# Patient Record
Sex: Male | Born: 1950
Health system: Southern US, Community
[De-identification: ages and names within clinical notes are randomized; demographics above are authoritative.]

## PROBLEM LIST (undated history)

## (undated) DIAGNOSIS — G709 Myoneural disorder, unspecified: Secondary | ICD-10-CM

## (undated) DIAGNOSIS — C801 Malignant (primary) neoplasm, unspecified: Secondary | ICD-10-CM

## (undated) DIAGNOSIS — Z973 Presence of spectacles and contact lenses: Secondary | ICD-10-CM

## (undated) DIAGNOSIS — F319 Bipolar disorder, unspecified: Secondary | ICD-10-CM

## (undated) DIAGNOSIS — J189 Pneumonia, unspecified organism: Secondary | ICD-10-CM

## (undated) DIAGNOSIS — G4730 Sleep apnea, unspecified: Secondary | ICD-10-CM

## (undated) DIAGNOSIS — M1990 Unspecified osteoarthritis, unspecified site: Secondary | ICD-10-CM

## (undated) HISTORY — PX: TONSILLECTOMY: SUR1361

## (undated) HISTORY — PX: APPENDECTOMY: SHX54

## (undated) HISTORY — PX: COLONOSCOPY: SHX174

---

## 2001-04-12 ENCOUNTER — Ambulatory Visit (HOSPITAL_COMMUNITY): Admission: RE | Admit: 2001-04-12 | Discharge: 2001-04-12 | Payer: Self-pay | Admitting: Family Medicine

## 2001-04-12 ENCOUNTER — Encounter: Payer: Self-pay | Admitting: Family Medicine

## 2002-05-30 HISTORY — PX: KNEE ARTHROSCOPY W/ MENISCAL REPAIR: SHX1877

## 2004-07-30 ENCOUNTER — Ambulatory Visit (HOSPITAL_COMMUNITY): Admission: RE | Admit: 2004-07-30 | Discharge: 2004-07-30 | Payer: Self-pay | Admitting: Gastroenterology

## 2013-09-16 ENCOUNTER — Encounter (HOSPITAL_BASED_OUTPATIENT_CLINIC_OR_DEPARTMENT_OTHER): Payer: Self-pay | Admitting: *Deleted

## 2013-09-16 NOTE — Progress Notes (Signed)
Pt separated-will have friend bring him-no labs needed-bring all meds and overnight bag

## 2013-09-17 ENCOUNTER — Other Ambulatory Visit: Payer: Self-pay | Admitting: Physician Assistant

## 2013-09-18 NOTE — H&P (Signed)
  Timothy [name]/[facility] [address] [place], [place] [address] [phone] A Division of [facility]  [name], M.D.   Robert A. Noemi Chapel, M.D.   Faythe Casa, M.D.   Johnny Bridge, M.D.   Almedia Balls, M.D Ernesta Amble. Percell Miller, M.D.  Joseph Pierini, M.D.  Lanier Prude, M.D.    Verner Chol, M.D. Mary L. Fenton Malling, PA-C  Kirstin A. Shepperson, PA-C  Josh Richards, PA-C [place], [place]   RE: Timothy, [name]   [id number]      DOB: 1950/12/28 PROGRESS NOTE: 09-16-13 [name] is seen for a new problem. He is an Forensic psychologist in town. Very healthy active 63 year old male. He got up abruptly and fell with a impact injury to his left non-dominant arm. He was seen in the ER in [place]. X-rays and CT scan obtained. I looked at those. He has a comminuted displaced impacted fracture left humeral head with elevated avulsion fragmentation of the greater tuberosity on top as well as front and back. Most of the humeral head is intact but impacted into marked valgus. Closed injury. He has been in a sling using pain medications. Presents to discuss definitive treatment. Remaining history general exam is outlined included in the chart.  EXAMINATION: Healthy 63 year old male He is in a sling. Very limited motion of the left shoulder due to pain. Significant bruising ecchymosis around the shoulder. Moderate amount of swelling throughout the entire left upper extremity but by his report this continues to improve. He is neurovascularly intact. Right shoulder has full motion good stability.  DISPOSITION: Markedly comminuted depressed 4-part proximal humerus fracture on the left. To ensure best outcome this needs to be treated with operative intervention especially given the displacement of the greater tuberosity fragments into the subacromial space. Discussed risks benefits and possible complications. Definitely in the category of open  reduction internal fixation rather than hemiarthroplasty. Anterior approach plate and screw fixation with reduction and repair of the avulsed fragments. Outpatient basis overnight observation. Recovery and outcome outlined. More than 25 minutes spent face-to-face covering this with him. Refilled pain medication added a muscle relaxer. Continue elevation for swelling. We will proceed later this week.  [name], M.D.  Electronically verified by [name], M.D. DFM:kah D 09-16-13 T 09-17-13

## 2013-09-19 ENCOUNTER — Ambulatory Visit (HOSPITAL_BASED_OUTPATIENT_CLINIC_OR_DEPARTMENT_OTHER): Payer: BC Managed Care – PPO | Admitting: Anesthesiology

## 2013-09-19 ENCOUNTER — Ambulatory Visit (HOSPITAL_BASED_OUTPATIENT_CLINIC_OR_DEPARTMENT_OTHER)
Admission: RE | Admit: 2013-09-19 | Discharge: 2013-09-20 | Disposition: A | Discharge status: Home / Self Care | Payer: BC Managed Care – PPO | Source: Ambulatory Visit | Attending: Orthopedic Surgery | Admitting: Orthopedic Surgery

## 2013-09-19 ENCOUNTER — Encounter (HOSPITAL_BASED_OUTPATIENT_CLINIC_OR_DEPARTMENT_OTHER): Payer: BC Managed Care – PPO | Admitting: Anesthesiology

## 2013-09-19 ENCOUNTER — Encounter (HOSPITAL_BASED_OUTPATIENT_CLINIC_OR_DEPARTMENT_OTHER)
Admission: RE | Disposition: A | Discharge status: Home / Self Care | Payer: Self-pay | Source: Ambulatory Visit | Attending: Orthopedic Surgery

## 2013-09-19 ENCOUNTER — Encounter (HOSPITAL_BASED_OUTPATIENT_CLINIC_OR_DEPARTMENT_OTHER): Payer: Self-pay | Admitting: Anesthesiology

## 2013-09-19 DIAGNOSIS — S42253A Displaced fracture of greater tuberosity of unspecified humerus, initial encounter for closed fracture: Secondary | ICD-10-CM | POA: Insufficient documentation

## 2013-09-19 DIAGNOSIS — S42293A Other displaced fracture of upper end of unspecified humerus, initial encounter for closed fracture: Secondary | ICD-10-CM | POA: Insufficient documentation

## 2013-09-19 DIAGNOSIS — S42202A Unspecified fracture of upper end of left humerus, initial encounter for closed fracture: Secondary | ICD-10-CM

## 2013-09-19 DIAGNOSIS — F319 Bipolar disorder, unspecified: Secondary | ICD-10-CM | POA: Insufficient documentation

## 2013-09-19 DIAGNOSIS — W19XXXA Unspecified fall, initial encounter: Secondary | ICD-10-CM | POA: Insufficient documentation

## 2013-09-19 HISTORY — DX: Presence of spectacles and contact lenses: Z97.3

## 2013-09-19 HISTORY — PX: ORIF HUMERUS FRACTURE: SHX2126

## 2013-09-19 HISTORY — DX: Unspecified osteoarthritis, unspecified site: M19.90

## 2013-09-19 HISTORY — DX: Bipolar disorder, unspecified: F31.9

## 2013-09-19 LAB — POCT HEMOGLOBIN-HEMACUE: Hemoglobin: 13.1 g/dL (ref 13.0–17.0)

## 2013-09-19 SURGERY — OPEN REDUCTION INTERNAL FIXATION (ORIF) PROXIMAL HUMERUS FRACTURE
Anesthesia: General | Laterality: Left

## 2013-09-19 MED ORDER — ONDANSETRON HCL 4 MG/2ML IJ SOLN: 4.0000 mg | Freq: Four times a day (QID) | INTRAMUSCULAR | Status: DC | PRN

## 2013-09-19 MED ORDER — ONDANSETRON HCL 4 MG/2ML IJ SOLN
INTRAMUSCULAR | Status: DC | PRN
  Administered 2013-09-19: 4 mg via INTRAVENOUS

## 2013-09-19 MED ORDER — LACTATED RINGERS IV SOLN
INTRAVENOUS | Status: DC
  Administered 2013-09-19 (×2): via INTRAVENOUS

## 2013-09-19 MED ORDER — LACTATED RINGERS IV SOLN: INTRAVENOUS | Status: DC

## 2013-09-19 MED ORDER — SUCCINYLCHOLINE CHLORIDE 20 MG/ML IJ SOLN
INTRAMUSCULAR | Status: DC | PRN
  Administered 2013-09-19: 100 mg via INTRAVENOUS

## 2013-09-19 MED ORDER — METOCLOPRAMIDE HCL 5 MG PO TABS: 5.0000 mg | ORAL_TABLET | Freq: Three times a day (TID) | ORAL | Status: DC | PRN

## 2013-09-19 MED ORDER — MIDAZOLAM HCL 2 MG/2ML IJ SOLN
1.0000 mg | INTRAMUSCULAR | Status: DC | PRN
  Administered 2013-09-19: 2 mg via INTRAVENOUS

## 2013-09-19 MED ORDER — HYDROMORPHONE HCL PF 1 MG/ML IJ SOLN
0.5000 mg | INTRAMUSCULAR | Status: DC | PRN
  Administered 2013-09-20 (×3): 1 mg via INTRAVENOUS
  Filled 2013-09-19 (×3): qty 1

## 2013-09-19 MED ORDER — DEXTROSE 5 % IV SOLN: 500.0000 mg | Freq: Four times a day (QID) | INTRAVENOUS | Status: DC | PRN

## 2013-09-19 MED ORDER — DEXAMETHASONE SODIUM PHOSPHATE 4 MG/ML IJ SOLN
INTRAMUSCULAR | Status: DC | PRN
  Administered 2013-09-19: 10 mg via INTRAVENOUS

## 2013-09-19 MED ORDER — FENTANYL CITRATE 0.05 MG/ML IJ SOLN
50.0000 ug | INTRAMUSCULAR | Status: DC | PRN
  Administered 2013-09-19: 100 ug via INTRAVENOUS

## 2013-09-19 MED ORDER — CEFAZOLIN SODIUM-DEXTROSE 2-3 GM-% IV SOLR
INTRAVENOUS | Status: AC
  Filled 2013-09-19: qty 100

## 2013-09-19 MED ORDER — OXYCODONE-ACETAMINOPHEN 5-325 MG PO TABS
1.0000 | ORAL_TABLET | ORAL | Status: DC | PRN
  Administered 2013-09-20 (×2): 2 via ORAL
  Filled 2013-09-19 (×3): qty 2

## 2013-09-19 MED ORDER — FENTANYL CITRATE 0.05 MG/ML IJ SOLN
INTRAMUSCULAR | Status: AC
  Filled 2013-09-19: qty 2

## 2013-09-19 MED ORDER — CEFAZOLIN SODIUM-DEXTROSE 2-3 GM-% IV SOLR
INTRAVENOUS | Status: AC
  Filled 2013-09-19: qty 50

## 2013-09-19 MED ORDER — OXYCODONE HCL 5 MG/5ML PO SOLN: 5.0000 mg | Freq: Once | ORAL | Status: AC | PRN

## 2013-09-19 MED ORDER — HYDROMORPHONE HCL PF 1 MG/ML IJ SOLN
0.2500 mg | INTRAMUSCULAR | Status: DC | PRN
  Administered 2013-09-20 (×2): 0.5 mg via INTRAVENOUS
  Filled 2013-09-19: qty 1

## 2013-09-19 MED ORDER — PROPOFOL 10 MG/ML IV BOLUS
INTRAVENOUS | Status: DC | PRN
  Administered 2013-09-19: 200 mg via INTRAVENOUS

## 2013-09-19 MED ORDER — DIVALPROEX SODIUM 500 MG PO DR TAB
500.0000 mg | DELAYED_RELEASE_TABLET | Freq: Two times a day (BID) | ORAL | Status: DC
  Administered 2013-09-19: 500 mg via ORAL
  Filled 2013-09-19: qty 1

## 2013-09-19 MED ORDER — MIDAZOLAM HCL 2 MG/2ML IJ SOLN
INTRAMUSCULAR | Status: AC
  Filled 2013-09-19: qty 2

## 2013-09-19 MED ORDER — SODIUM CHLORIDE 0.9 % IV SOLN
INTRAVENOUS | Status: DC
  Administered 2013-09-19 (×2): via INTRAVENOUS

## 2013-09-19 MED ORDER — BUPIVACAINE-EPINEPHRINE PF 0.5-1:200000 % IJ SOLN
INTRAMUSCULAR | Status: DC | PRN
  Administered 2013-09-19: 20 mL via PERINEURAL

## 2013-09-19 MED ORDER — FENTANYL CITRATE 0.05 MG/ML IJ SOLN
INTRAMUSCULAR | Status: DC | PRN
  Administered 2013-09-19: 25 ug via INTRAVENOUS

## 2013-09-19 MED ORDER — CEFAZOLIN SODIUM-DEXTROSE 2-3 GM-% IV SOLR
2.0000 g | INTRAVENOUS | Status: AC
  Administered 2013-09-19: 2 g via INTRAVENOUS

## 2013-09-19 MED ORDER — CEFAZOLIN SODIUM-DEXTROSE 2-3 GM-% IV SOLR
2.0000 g | Freq: Four times a day (QID) | INTRAVENOUS | Status: AC
  Administered 2013-09-19 – 2013-09-20 (×3): 2 g via INTRAVENOUS

## 2013-09-19 MED ORDER — CHLORHEXIDINE GLUCONATE 4 % EX LIQD: 60.0000 mL | Freq: Once | CUTANEOUS | Status: DC

## 2013-09-19 MED ORDER — OXYCODONE HCL 5 MG PO TABS: 5.0000 mg | ORAL_TABLET | Freq: Once | ORAL | Status: AC | PRN

## 2013-09-19 MED ORDER — ONDANSETRON HCL 4 MG PO TABS: 4.0000 mg | ORAL_TABLET | Freq: Four times a day (QID) | ORAL | Status: DC | PRN

## 2013-09-19 MED ORDER — METHOCARBAMOL 500 MG PO TABS
500.0000 mg | ORAL_TABLET | Freq: Four times a day (QID) | ORAL | Status: DC | PRN
  Administered 2013-09-20 (×2): 500 mg via ORAL
  Filled 2013-09-19 (×2): qty 1

## 2013-09-19 MED ORDER — ONDANSETRON HCL 4 MG/2ML IJ SOLN: 4.0000 mg | Freq: Once | INTRAMUSCULAR | Status: AC | PRN

## 2013-09-19 MED ORDER — METOCLOPRAMIDE HCL 5 MG/ML IJ SOLN: 5.0000 mg | Freq: Three times a day (TID) | INTRAMUSCULAR | Status: DC | PRN

## 2013-09-19 MED ORDER — LIDOCAINE HCL (CARDIAC) 20 MG/ML IV SOLN
INTRAVENOUS | Status: DC | PRN
  Administered 2013-09-19: 100 mg via INTRAVENOUS

## 2013-09-19 SURGICAL SUPPLY — 72 items
BENZOIN TINCTURE PRP APPL 2/3 (GAUZE/BANDAGES/DRESSINGS) IMPLANT
BIT DRILL 2.8X4 QC CORT (BIT) ×2 IMPLANT
BIT DRILL 4 LONG FAST STEP (BIT) ×2 IMPLANT
BIT DRILL 4 SHORT FAST STEP (BIT) ×2 IMPLANT
BLADE 10 SAFETY STRL DISP (BLADE) IMPLANT
BLADE 15 SAFETY STRL DISP (BLADE) ×2 IMPLANT
CANISTER SUCT 3000ML (MISCELLANEOUS) ×2 IMPLANT
CLEANER CAUTERY TIP 5X5 PAD (MISCELLANEOUS) ×1 IMPLANT
DECANTER SPIKE VIAL GLASS SM (MISCELLANEOUS) IMPLANT
DRAPE INCISE IOBAN 66X45 STRL (DRAPES) IMPLANT
DRAPE OEC MINIVIEW 54X84 (DRAPES) ×2 IMPLANT
DRAPE SURG 17X23 STRL (DRAPES) IMPLANT
DRAPE U-SHAPE 47X51 STRL (DRAPES) ×2 IMPLANT
DRAPE U-SHAPE 76X120 STRL (DRAPES) ×4 IMPLANT
DURAPREP 26ML APPLICATOR (WOUND CARE) ×2 IMPLANT
ELECT REM PT RETURN 9FT ADLT (ELECTROSURGICAL) ×2
ELECTRODE REM PT RTRN 9FT ADLT (ELECTROSURGICAL) ×1 IMPLANT
GAUZE SPONGE 4X4 16PLY XRAY LF (GAUZE/BANDAGES/DRESSINGS) IMPLANT
GAUZE XEROFORM 1X8 LF (GAUZE/BANDAGES/DRESSINGS) IMPLANT
GLOVE BIO SURGEON STRL SZ 6.5 (GLOVE) ×2 IMPLANT
GLOVE BIO SURGEON STRL SZ8 (GLOVE) ×2 IMPLANT
GLOVE BIOGEL PI IND STRL 7.0 (GLOVE) ×2 IMPLANT
GLOVE BIOGEL PI INDICATOR 7.0 (GLOVE) ×2
GLOVE ECLIPSE 6.5 STRL STRAW (GLOVE) ×4 IMPLANT
GLOVE EXAM NITRILE LRG STRL (GLOVE) ×2 IMPLANT
GLOVE ORTHO TXT STRL SZ7.5 (GLOVE) ×2 IMPLANT
GOWN STRL REUS W/ TWL LRG LVL3 (GOWN DISPOSABLE) ×4 IMPLANT
GOWN STRL REUS W/ TWL XL LVL3 (GOWN DISPOSABLE) ×1 IMPLANT
GOWN STRL REUS W/TWL LRG LVL3 (GOWN DISPOSABLE) ×4
GOWN STRL REUS W/TWL XL LVL3 (GOWN DISPOSABLE) ×2
NS IRRIG 1000ML POUR BTL (IV SOLUTION) ×2 IMPLANT
PACK ARTHROSCOPY DSU (CUSTOM PROCEDURE TRAY) ×2 IMPLANT
PACK BASIN DAY SURGERY FS (CUSTOM PROCEDURE TRAY) ×2 IMPLANT
PAD CLEANER CAUTERY TIP 5X5 (MISCELLANEOUS) ×1
PASSER SUT SWANSON 36MM LOOP (INSTRUMENTS) ×2 IMPLANT
PEG STND 4.0X35MM (Orthopedic Implant) ×2 IMPLANT
PEG STND 4.0X45.0MM (Orthopedic Implant) ×8 IMPLANT
PEG STND 4.0X52.5MM (Orthopedic Implant) ×2 IMPLANT
PEGSTD 4.0X35MM (Orthopedic Implant) ×1 IMPLANT
PEGSTD 4.0X45.0MM (Orthopedic Implant) ×4 IMPLANT
PENCIL BUTTON HOLSTER BLD 10FT (ELECTRODE) ×2 IMPLANT
PIN GUIDE SHOULDER 2.0MM (PIN) ×4 IMPLANT
PLATE SHOULDER S3 4HOLE LT (Plate) ×2 IMPLANT
SCREW LOCK 90D ANGLED 3.8X32 (Screw) ×2 IMPLANT
SCREW LOCK 90D ANGLED 3.8X36 (Screw) ×2 IMPLANT
SCREW MULTIDIR 3.8X36 HUMRL (Screw) ×2 IMPLANT
SLEEVE SCD COMPRESS KNEE MED (MISCELLANEOUS) ×2 IMPLANT
SLING ARM IMMOBILIZER LRG (SOFTGOODS) ×2 IMPLANT
SLING ARM IMMOBILIZER MED (SOFTGOODS) IMPLANT
SLING ARM LRG ADULT FOAM STRAP (SOFTGOODS) IMPLANT
SLING ARM MED ADULT FOAM STRAP (SOFTGOODS) IMPLANT
SLING ARM XL FOAM STRAP (SOFTGOODS) IMPLANT
SPONGE GAUZE 4X4 12PLY (GAUZE/BANDAGES/DRESSINGS) ×2 IMPLANT
SPONGE LAP 18X18 X RAY DECT (DISPOSABLE) ×6 IMPLANT
STRIP CLOSURE SKIN 1/2X4 (GAUZE/BANDAGES/DRESSINGS) IMPLANT
SUCTION FRAZIER TIP 10 FR DISP (SUCTIONS) ×2 IMPLANT
SUT FIBERWIRE #2 38 T-5 BLUE (SUTURE) ×14
SUT FIBERWIRE #5 38 CONV NDL (SUTURE) ×2
SUT MNCRL AB 4-0 PS2 18 (SUTURE) IMPLANT
SUT VIC AB 0 CT1 18XCR BRD 8 (SUTURE) IMPLANT
SUT VIC AB 0 CT1 27 (SUTURE)
SUT VIC AB 0 CT1 27XBRD ANBCTR (SUTURE) IMPLANT
SUT VIC AB 0 CT1 8-18 (SUTURE)
SUT VIC AB 2-0 SH 27 (SUTURE)
SUT VIC AB 2-0 SH 27XBRD (SUTURE) IMPLANT
SUT VICRYL 3-0 CR8 SH (SUTURE) IMPLANT
SUTURE FIBERWR #2 38 T-5 BLUE (SUTURE) ×7 IMPLANT
SUTURE FIBERWR #5 38 CONV NDL (SUTURE) ×1 IMPLANT
SYR BULB 3OZ (MISCELLANEOUS) ×2 IMPLANT
SYR BULB IRRIGATION 50ML (SYRINGE) IMPLANT
TOWEL OR 17X24 6PK STRL BLUE (TOWEL DISPOSABLE) ×4 IMPLANT
YANKAUER SUCT BULB TIP NO VENT (SUCTIONS) ×2 IMPLANT

## 2013-09-19 NOTE — Discharge Instructions (Signed)
Humerus Fracture, Treated with Open Reduction  Wear sling at all times.  May change dressing on Sunday.  May shower on Tuesday, but do not soak incision.  May apply ice for up to 20 minutes at a time for pain and swelling.  Follow up appointment in our office in one week.   HOME CARE INSTRUCTIONS  You may resume normal diet and activities as directed or allowed.  Change dressings if necessary or as instructed.  Only take over-the-counter or prescription medicines for pain, discomfort, or fever as directed by your caregiver. SEEK IMMEDIATE MEDICAL CARE IF:   There is redness, swelling, or increasing pain in the wound.  There is pus coming from wound.  An unexplained oral temperature above 102 F (38.9 C) develops, or as your caregiver suggests.  A bad smell is coming from the wound or dressing.  The edges of the wound are not staying together after sutures or staples have been removed. MAKE SURE YOU:   Understand these instructions.  Will watch your condition.  Will get help right away if you are not doing well or get worse. Document Released: 11/09/2000 Document Revised: 08/08/2011 Document Reviewed: 01/04/2008 [facility] Patient Information 2014 [place].

## 2013-09-19 NOTE — Anesthesia Preprocedure Evaluation (Signed)
Anesthesia Evaluation  Patient identified by MRN, date of birth, ID band Patient awake    Reviewed: Allergy & Precautions, H&P , NPO status , Patient's Chart, lab work & pertinent test results  Airway Mallampati: I TM Distance: >3 FB Neck ROM: Full    Dental  (+) Teeth Intact, Dental Advisory Given   Pulmonary  breath sounds clear to auscultation        Cardiovascular Rhythm:Regular Rate:Normal     Neuro/Psych PSYCHIATRIC DISORDERS Bipolar Disorder    GI/Hepatic   Endo/Other    Renal/GU      Musculoskeletal   Abdominal   Peds  Hematology   Anesthesia Other Findings   Reproductive/Obstetrics                           Anesthesia Physical Anesthesia Plan  ASA: II  Anesthesia Plan: General   Post-op Pain Management:    Induction: Intravenous, Rapid sequence and Cricoid pressure planned  Airway Management Planned: Oral ETT  Additional Equipment:   Intra-op Plan:   Post-operative Plan: Extubation in OR  Informed Consent: I have reviewed the patients History and Physical, chart, labs and discussed the procedure including the risks, benefits and alternatives for the proposed anesthesia with the patient or authorized representative who has indicated his/her understanding and acceptance.   Dental advisory given  Plan Discussed with: CRNA, Anesthesiologist and Surgeon  Anesthesia Plan Comments:         Anesthesia Quick Evaluation

## 2013-09-19 NOTE — Anesthesia Procedure Notes (Addendum)
Anesthesia Regional Block:  Interscalene brachial plexus block  Pre-Anesthetic Checklist: ,, timeout performed, Correct Patient, Correct Site, Correct Laterality, Correct Procedure, Correct Position, site marked, Risks and benefits discussed,  Surgical consent,  Pre-op evaluation,  At surgeon's request and post-op pain management  Laterality: Left and Upper  Prep: chloraprep       Needles:  Injection technique: Single-shot  Needle Type: Echogenic Needle     Needle Length: 5cm 5 cm Needle Gauge: 21 and 21 G    Additional Needles:  Procedures: ultrasound guided (picture in chart) Interscalene brachial plexus block Narrative:  Start time: 09/19/2013 8:33 AM End time: 09/19/2013 8:41 AM Injection made incrementally with aspirations every 5 mL.  Performed by: Personally  Anesthesiologist: Lorrene Reid, MD   Procedure Name: Intubation Performed by: Terrance Mass Pre-anesthesia Checklist: Patient identified, Timeout performed, Emergency Drugs available, Suction available and Patient being monitored Patient Re-evaluated:Patient Re-evaluated prior to inductionOxygen Delivery Method: Circle system utilized Preoxygenation: Pre-oxygenation with 100% oxygen Intubation Type: IV induction Ventilation: Mask ventilation without difficulty Laryngoscope Size: Miller and 2 Grade View: Grade I Tube type: Oral Tube size: 8.0 mm Number of attempts: 1 Airway Equipment and Method: Stylet Placement Confirmation: ETT inserted through vocal cords under direct vision,  breath sounds checked- equal and bilateral and positive ETCO2 Secured at: 23 cm Tube secured with: Tape Dental Injury: Teeth and Oropharynx as per pre-operative assessment

## 2013-09-19 NOTE — Anesthesia Postprocedure Evaluation (Signed)
  Anesthesia Post-op Note  Patient: Timothy [name]  Procedure(s) Performed: Procedure(s): LEFT OPEN REDUCTION INTERNAL FIXATION (ORIF) PROXIMAL HUMERUS  (Left)  Patient Location: PACU  Anesthesia Type:GA combined with regional for post-op pain  Level of Consciousness: awake, alert  and oriented  Airway and Oxygen Therapy: Patient Spontanous Breathing  Post-op Pain: none  Post-op Assessment: Post-op Vital signs reviewed  Post-op Vital Signs: Reviewed  Last Vitals:  Filed Vitals:   09/19/13 1344  BP: 129/79  Pulse: 82  Temp: 36.8 C  Resp: 16    Complications: No apparent anesthesia complications

## 2013-09-19 NOTE — Progress Notes (Signed)
Assisted Dr. Crews with left, ultrasound guided, interscalene  block. Side rails up, monitors on throughout procedure. See vital signs in flow sheet. Tolerated Procedure well. 

## 2013-09-19 NOTE — Transfer of Care (Signed)
Immediate Anesthesia Transfer of Care Note  Patient: Timothy [name]  Procedure(s) Performed: Procedure(s): LEFT OPEN REDUCTION INTERNAL FIXATION (ORIF) PROXIMAL HUMERUS  (Left)  Patient Location: PACU  Anesthesia Type:General  Level of Consciousness: awake and sedated  Airway & Oxygen Therapy: Patient Spontanous Breathing and Patient connected to face mask oxygen  Post-op Assessment: Report given to PACU RN and Post -op Vital signs reviewed and stable  Post vital signs: Reviewed and stable  Complications: No apparent anesthesia complications

## 2013-09-19 NOTE — Interval H&P Note (Signed)
History and Physical Interval Note:  09/19/2013 7:49 AM  Timothy [name]  has presented today for surgery, with the diagnosis of LEFT HUMERUS FRACTURE UPPER END  The various methods of treatment have been discussed with the patient and family. After consideration of risks, benefits and other options for treatment, the patient has consented to  Procedure(s) with comments: LEFT OPEN REDUCTION INTERNAL FIXATION (ORIF) PROXIMAL HUMERUS  (Left) - ANESTHESIA:  GENERAL, BLOCK as a surgical intervention .  The patient's history has been reviewed, patient examined, no change in status, stable for surgery.  I have reviewed the patient's chart and labs.  Questions were answered to the patient's satisfaction.     [name]

## 2013-09-20 NOTE — Op Note (Signed)
NAMECORTNEY, Timothy [name]                ACCOUNT NO.:  [id number]  MEDICAL RECORD NO.:  [id number]  LOCATION:                                 FACILITY:  PHYSICIAN:  [name], M.D. DATE OF BIRTH:  1950-08-27  DATE OF PROCEDURE:  09/19/2013 DATE OF DISCHARGE:  09/20/2013                              OPERATIVE REPORT   PREOPERATIVE DIAGNOSES:  Left proximal humerus 4-part fracture. Depressed humeral head with avulsed displaced fragments of tuberosity on the top and in the back.  Closed injury.  POSTOPERATIVE DIAGNOSES:  Left proximal humerus 4-part fracture. Depressed humeral head with avulsed displaced fragments of tuberosity on the top and in the back.  Closed injury.  Also significant interval tearing from lateral to medial through the anterior third of supraspinatus tendon.  Marked comminution.  Very good bone stock.  PROCEDURE:  Left shoulder open reduction and internal fixation proximal humerus fracture with a Biomet plate.  Three screws in the shaft.  Five pegs in the humeral head after reduction.  Repair of tuberosity fracture with FiberWire suture, partially through the holes in the plate and also through surrounding soft tissue.  Repair of interval tear in the rotator cuff anteriorly with FiberWire.  SURGEON:  [name], M.D.  ASSISTANT:  Edmonia Lynch, MD and Doran Stabler, PA, present throughout the entire case and necessary for timely completion of procedure.  ANESTHESIA:  General.  BLOOD LOSS:  100 mL.  BLOOD GIVEN:  None.  SPECIMENS:  None.  COUNTS:  None.  COMPLICATIONS:  None.  DRESSINGS:  Soft compressive with shoulder immobilizer.  DESCRIPTION OF PROCEDURE:  The patient was brought to the operating room, placed on the operating table in supine position.  After adequate anesthesia had been obtained, placed in beach-chair position, prepped and draped in usual sterile fashion.  Incision along the deltopectoral interval.  Hemostasis with  cautery.  Retractors put in place.  Fracture pattern identified.  Sufficient exposure to allow placement of fixation. The tuberosity fragments which were the attachment of the supra and infraspinatus were identified, tied with FiberWire.  Biceps tendon still intact.  We then levered the head up into anatomic position.  A Biomet place was placed just behind the biceps tendon anterolaterally.  It was then temporally held with K-wires.  Fluoroscopic guidance throughout. Fixed distally with 3 screws and 5 pegs proximally into the head.  Care was taken in place as well.  Not penetrating the joint.  Once this confirmed good alignment and good fixation, I took the displaced fragments.  The posterior fragment, I attempted to put through the plate with #2 FiberWire.  I ended up having to go to the #5 FiberWire because the FiberWire kept breaking at the plate.  I was finally able to get a good attachment reduction suturing that through the hole of the top of the plate.  The suture from the tuberosity of the supraspinatus was brought over laterally, sewn down into the soft tissue and a portion of this was passed through the top of the plate.  Once that was complete and all fragments were fixed, I then did a FiberWire suture closure of the interval tear.  Biceps  protected and intact throughout.  At completion, I had a nicely sealed cuff.  Had an anatomic reduction of the shoulder with very good reduction confirmed visually and fluoroscopically.  Retractors removed.  Wound irrigated.  Subcutaneous and subcuticular closure.  Sterile compressive dressing applied. Shoulder immobilizer applied.  Anesthesia reversed.  Brought to the recovery room.  Tolerated the surgery well with no complications.     [name], M.D.   ______________________________ [name], M.D.    DFM/MEDQ  D:  09/19/2013  T:  09/20/2013  Job:  [id number]

## 2013-09-23 ENCOUNTER — Encounter (HOSPITAL_BASED_OUTPATIENT_CLINIC_OR_DEPARTMENT_OTHER): Payer: Self-pay | Admitting: Orthopedic Surgery

## 2015-09-01 DIAGNOSIS — Z86010 Personal history of colonic polyps: Secondary | ICD-10-CM | POA: Diagnosis not present

## 2015-09-01 DIAGNOSIS — R197 Diarrhea, unspecified: Secondary | ICD-10-CM | POA: Diagnosis not present

## 2015-09-22 DIAGNOSIS — R197 Diarrhea, unspecified: Secondary | ICD-10-CM | POA: Diagnosis not present

## 2015-10-30 ENCOUNTER — Other Ambulatory Visit: Payer: Self-pay | Admitting: Gastroenterology

## 2015-10-30 DIAGNOSIS — K529 Noninfective gastroenteritis and colitis, unspecified: Secondary | ICD-10-CM | POA: Diagnosis not present

## 2015-10-30 DIAGNOSIS — D125 Benign neoplasm of sigmoid colon: Secondary | ICD-10-CM | POA: Diagnosis not present

## 2015-10-30 DIAGNOSIS — D126 Benign neoplasm of colon, unspecified: Secondary | ICD-10-CM | POA: Diagnosis not present

## 2015-11-18 DIAGNOSIS — F322 Major depressive disorder, single episode, severe without psychotic features: Secondary | ICD-10-CM | POA: Diagnosis not present

## 2015-12-09 DIAGNOSIS — L739 Follicular disorder, unspecified: Secondary | ICD-10-CM | POA: Diagnosis not present

## 2015-12-09 DIAGNOSIS — R5383 Other fatigue: Secondary | ICD-10-CM | POA: Diagnosis not present

## 2015-12-09 DIAGNOSIS — Z7251 High risk heterosexual behavior: Secondary | ICD-10-CM | POA: Diagnosis not present

## 2015-12-30 DIAGNOSIS — N200 Calculus of kidney: Secondary | ICD-10-CM | POA: Diagnosis not present

## 2016-03-14 ENCOUNTER — Ambulatory Visit (INDEPENDENT_AMBULATORY_CARE_PROVIDER_SITE_OTHER): Payer: Medicare Other | Admitting: Internal Medicine

## 2016-03-14 ENCOUNTER — Encounter: Payer: Self-pay | Admitting: Internal Medicine

## 2016-03-14 VITALS — BP 174/96 | HR 72 | Temp 97.8°F | Resp 16 | Ht 68.0 in | Wt 174.2 lb

## 2016-03-14 DIAGNOSIS — E7800 Pure hypercholesterolemia, unspecified: Secondary | ICD-10-CM

## 2016-03-14 DIAGNOSIS — I10 Essential (primary) hypertension: Secondary | ICD-10-CM | POA: Diagnosis not present

## 2016-03-14 DIAGNOSIS — R7309 Other abnormal glucose: Secondary | ICD-10-CM | POA: Diagnosis not present

## 2016-03-14 DIAGNOSIS — R197 Diarrhea, unspecified: Secondary | ICD-10-CM | POA: Diagnosis not present

## 2016-03-14 DIAGNOSIS — Z79899 Other long term (current) drug therapy: Secondary | ICD-10-CM | POA: Insufficient documentation

## 2016-03-14 DIAGNOSIS — E559 Vitamin D deficiency, unspecified: Secondary | ICD-10-CM

## 2016-03-14 NOTE — Progress Notes (Signed)
[facility] [name], M.D.        Timothy [name]. Silverio Lay, P.A.-C       Starlyn Skeans, P.A.-C  [facility]                [address] [place], [place] [id number] Telephone [phone] Telefax [phone] ______________________________________________________________________     This very nice 65 y.o. dwm presents as a new patient to establish and for evaluation for  Hypertension, Hyperlipidemia, Pre-Diabetes and Vitamin D Deficiency.      Patient has generally been in good health with occasional BP elevations up to the 150's at Dr Arlyn Leak office and he was advised random monitoring and did find a recent elevated BP in the 180's at a drug store. Today's BP was 174/96 by the nurse and on my recheck was 144/87 & 142/84. Patient does relate that he had a negative w/u last year by Dr Gaynelle Arabian for Hematuria. Patient has had no complaints of any cardiac type chest pain, palpitations, dyspnea/orthopnea/PND, dizziness, claudication, or dependent edema.     Patient denies k/o testing for lipids in the past. Further he also denies k/o testing for glucose intolerance, but does relate being told that he had a low Vit D , but has not been taking supplements.  He denies symptoms of reactive hypoglycemia, diabetic polys, paresthesias or visual blurring.       He reports having diarrhea since Mar 2017 up to 5-6 x/daily with poorly formed stools.  Patient does relate having a colonoscopy in May 2017 by Dr Amedeo Plenty and apparently had a benign polyp. No further w/u was entertained and Diarrhea has persisted.   No Known Allergies PMHx:   Past Medical History:  Diagnosis Date  . Arthritis   . Bipolar disorder ([place])   . Wears glasses    reading    There is no immunization history on file for this patient. Past Surgical History:  Procedure Laterality Date  . APPENDECTOMY    . COLONOSCOPY    . KNEE ARTHROSCOPY W/  MENISCAL REPAIR  2004   left  . ORIF HUMERUS FRACTURE Left 09/19/2013   Procedure: LEFT OPEN REDUCTION INTERNAL FIXATION (ORIF) PROXIMAL HUMERUS; ROTATOR CUFF REPAIR;  Surgeon: [name], MD;  Location: [facility];  Service: Orthopedics;  Laterality: Left;  . TONSILLECTOMY     FHx:    Adopted . No known sibs. Has 2 sons - ages 42 & 34 and 2 daughters - ages 63 & 38 and 34 grandchildren and all reported good health.   SHx:    Divorced 2016. Denies tobacco use. Occas alcohol.  Systems Review:  Constitutional: Denies fever, chills, wt changes, headaches, insomnia, fatigue, night sweats, change in appetite. Eyes: Denies redness, blurred vision, diplopia, discharge, itchy, watery eyes.  ENT: Denies discharge, congestion, post nasal drip, epistaxis, sore throat, earache, hearing loss, dental pain, tinnitus, vertigo, sinus pain, snoring.  CV: Denies chest pain, palpitations, irregular heartbeat, syncope, dyspnea, diaphoresis, orthopnea, PND, claudication or edema. Respiratory: denies cough, dyspnea, DOE, pleurisy, hoarseness, laryngitis, wheezing.  Gastrointestinal: Denies dysphagia, odynophagia, heartburn, reflux, water brash, abdominal pain or cramps, nausea, vomiting, bloating, diarrhea, constipation, hematemesis, melena, hematochezia  or hemorrhoids. Genitourinary: Denies dysuria, frequency, urgency, nocturia, hesitancy, discharge, hematuria or flank pain. Musculoskeletal:  Has ongoing L shoulder pains.  Skin: Denies pruritus, rash, hives, warts, acne, eczema or change in skin lesion(s).  Neuro: No weakness, tremor, incoordination, spasms, paresthesia or pain. Psychiatric: Denies confusion, memory loss or sensory loss. Endo: Denies change in weight, skin or hair change.  Heme/Lymph: No excessive bleeding, bruising or enlarged lymph nodes.  Physical Exam BP 174/96 and rechecked at 144/87 at R wrist and 142/84 w/Rt brachial cuff  P 72   T 97.8 F    R 16   Ht 5\' 8"    Wt  174 lb 3.2 oz    BMI 26.49   Appears well nourished and in no distress.  Eyes: PERRLA, EOMs, conjunctiva no swelling or erythema. Sinuses: No frontal/maxillary tenderness ENT/Mouth: EAC's clear, TM's nl w/o erythema, bulging. Nares clear w/o erythema, swelling, exudates. Oropharynx clear without erythema or exudates. Oral hygiene is good. Tongue normal, non obstructing. Hearing intact.  Neck: Supple. Thyroid nl. Car 2+/2+ without bruits, nodes or JVD. Chest: Respirations nl with BS clear & equal w/o rales, rhonchi, wheezing or stridor.  Cor: Heart sounds normal w/ regular rate and rhythm without sig. murmurs, gallops, clicks, or rubs. Peripheral pulses normal and equal  without edema.  Abdomen: Soft & bowel sounds normal. Non-tender w/o guarding, rebound, hernias, masses, or organomegaly.  Lymphatics: Unremarkable.  Musculoskeletal: Full ROM all peripheral extremities, joint stability, 5/5 strength, and normal gait.  Skin: Warm, dry without exposed rashes, lesions or ecchymosis apparent.  Neuro: Cranial nerves intact, reflexes equal bilaterally. Sensory-motor testing grossly intact. Tendon reflexes grossly intact.  Pysch: Alert & oriented x 3.  Insight and judgement nl & appropriate. No ideations.  Assessment and Plan:    1. Labile Hypertension  - Monitor blood pressure at home & work 2-3 x/day and document BP & pulse and return in 3 weeks to review. Recc Dr Dimas Chyle books on prudent diet.   Recc. to go to the ER if any CP, SOB, nausea, dizziness, severe HA, changes vision/speech, left arm numbness and tingling and jaw pain. - TSH  2. Elevated cholesterol  - Encouraged prudent diet, exercise,& lifestyle modifications.  - Lipid panel - TSH  3. Other abnormal glucose  - Continue diet, exercise, lifestyle modifications. Monitor appropriate labs. - Hemoglobin A1c - Insulin, random  4. Vitamin D deficiency  - Recc 10,000 units supplementation. - VITAMIN D 25 Hydroxy   5.  Diarrhea, unspecified type  - TSH - Celiac Disease Comprehensive Panel with Reflexes  6. Medication management  - CBC with Differential/Platelet - BASIC METABOLIC PANEL WITH GFR - Hepatic function panel - Magnesium - Urinalysis, Routine w reflex microscopic  - Valproic acid level      Recommended regular exercise, BP monitoring, weight control, and discussed med and SE's. Recommended labs to assess and monitor clinical status. Further disposition pending results of labs. Over 40 minutes of exam, counseling, chart review was performed

## 2016-03-14 NOTE — Patient Instructions (Signed)
[place] BP cuff +++++++++++++++++++++++++++++ Recommend Adult Low Dose Aspirin or  coated  Aspirin 81 mg daily  To reduce risk of Colon Cancer 20 %,  Skin Cancer 26 % ,  Melanoma 46%  and  Pancreatic cancer 60% +++++++++++++++++++++++++ Vitamin D goal  is between 70-100.  Please make sure that you are taking your Vitamin D as directed.  It is very important as a natural anti-inflammatory  helping hair, skin, and nails, as well as reducing stroke and heart attack risk.  It helps your bones and helps with mood. It also decreases numerous cancer risks so please take it as directed.  Low Vit D is associated with a 200-300% higher risk for CANCER  and 200-300% higher risk for HEART   ATTACK  &  STROKE.   .....................................Marland Kitchen It is also associated with higher death rate at younger ages,  autoimmune diseases like Rheumatoid arthritis, Lupus, Multiple Sclerosis.    Also many other serious conditions, like depression, Alzheimer's Dementia, infertility, muscle aches, fatigue, fibromyalgia - just to name a few. ++++++++++++++++++++ Recommend the book "The END of DIETING" by Dr Excell Seltzer  & the book "The END of DIABETES " by Dr Excell Seltzer At [email or web address] - get book & Audio CD's    Being diabetic has a  300% increased risk for heart attack, stroke, cancer, and alzheimer- type vascular dementia. It is very important that you work harder with diet by avoiding all foods that are white. Avoid white rice (brown & wild rice is OK), white potatoes (sweetpotatoes in moderation is OK), White bread or wheat bread or anything made out of white flour like bagels, donuts, rolls, buns, biscuits, cakes, pastries, cookies, pizza crust, and pasta (made from white flour & egg whites) - vegetarian pasta or spinach or wheat pasta is OK. Multigrain breads like Arnold's or Pepperidge Farm, or multigrain sandwich thins or flatbreads.  Diet, exercise and weight loss can reverse and cure diabetes in the  early stages.  Diet, exercise and weight loss is very important in the control and prevention of complications of diabetes which affects every system in your body, ie. Brain - dementia/stroke, eyes - glaucoma/blindness, heart - heart attack/heart failure, kidneys - dialysis, stomach - gastric paralysis, intestines - malabsorption, nerves - severe painful neuritis, circulation - gangrene & loss of a leg(s), and finally cancer and Alzheimers.    I recommend avoid fried & greasy foods,  sweets/candy, white rice (brown or wild rice or Quinoa is OK), white potatoes (sweet potatoes are OK) - anything made from white flour - bagels, doughnuts, rolls, buns, biscuits,white and wheat breads, pizza crust and traditional pasta made of white flour & egg white(vegetarian pasta or spinach or wheat pasta is OK).  Multi-grain bread is OK - like multi-grain flat bread or sandwich thins. Avoid alcohol in excess. Exercise is also important.    Eat all the vegetables you want - avoid meat, especially red meat and dairy - especially cheese.  Cheese is the most concentrated form of trans-fats which is the worst thing to clog up our arteries. Veggie cheese is OK which can be found in the fresh produce section at [employer] or [employer] or [employer]  +++++++++++++++++++++ DASH Eating Plan  DASH stands for "Dietary Approaches to Stop Hypertension."   The DASH eating plan is a healthy eating plan that has been shown to reduce high blood pressure (hypertension). Additional health benefits may include reducing the risk of type 2 diabetes mellitus, heart disease, and stroke. The DASH  eating plan may also help with weight loss. WHAT DO I NEED TO KNOW ABOUT THE DASH EATING PLAN? For the DASH eating plan, you will follow these general guidelines:  Choose foods with a percent daily value for sodium of less than 5% (as listed on the food label).  Use salt-free seasonings or herbs instead of table salt or sea salt.  Check with  your health care provider or pharmacist before using salt substitutes.  Eat lower-sodium products, often labeled as "lower sodium" or "no salt added."  Eat fresh foods.  Eat more vegetables, fruits, and low-fat dairy products.  Choose whole grains. Look for the word "whole" as the first word in the ingredient list.  Choose fish   Limit sweets, desserts, sugars, and sugary drinks.  Choose heart-healthy fats.  Eat veggie cheese   Eat more home-cooked food and less restaurant, buffet, and fast food.  Limit fried foods.  Cook foods using methods other than frying.  Limit canned vegetables. If you do use them, rinse them well to decrease the sodium.  When eating at a restaurant, ask that your food be prepared with less salt, or no salt if possible.                      WHAT FOODS CAN I EAT? Read Dr Fara Olden Fuhrman's books on The End of Dieting & The End of Diabetes  Grains Whole grain or whole wheat bread. Brown rice. Whole grain or whole wheat pasta. Quinoa, bulgur, and whole grain cereals. Low-sodium cereals. Corn or whole wheat flour tortillas. Whole grain cornbread. Whole grain crackers. Low-sodium crackers.  Vegetables Fresh or frozen vegetables (raw, steamed, roasted, or grilled). Low-sodium or reduced-sodium tomato and vegetable juices. Low-sodium or reduced-sodium tomato sauce and paste. Low-sodium or reduced-sodium canned vegetables.   Fruits All fresh, canned (in natural juice), or frozen fruits.  Protein Products  All fish and seafood.  Dried beans, peas, or lentils. Unsalted nuts and seeds. Unsalted canned beans.  Dairy Low-fat dairy products, such as skim or 1% milk, 2% or reduced-fat cheeses, low-fat ricotta or cottage cheese, or plain low-fat yogurt. Low-sodium or reduced-sodium cheeses.  Fats and Oils Tub margarines without trans fats. Light or reduced-fat mayonnaise and salad dressings (reduced sodium). Avocado. Safflower, olive, or canola oils. Natural peanut  or almond butter.  Other Unsalted popcorn and pretzels. The items listed above may not be a complete list of recommended foods or beverages. Contact your dietitian for more options.  +++++++++++++++  WHAT FOODS ARE NOT RECOMMENDED? Grains/ White flour or wheat flour White bread. White pasta. White rice. Refined cornbread. Bagels and croissants. Crackers that contain trans fat.  Vegetables  Creamed or fried vegetables. Vegetables in a . Regular canned vegetables. Regular canned tomato sauce and paste. Regular tomato and vegetable juices.  Fruits Dried fruits. Canned fruit in light or heavy syrup. Fruit juice.  Meat and Other Protein Products Meat in general - RED meat & White meat.  Fatty cuts of meat. Ribs, chicken wings, all processed meats as bacon, sausage, bologna, salami, fatback, hot dogs, bratwurst and packaged luncheon meats.  Dairy Whole or 2% milk, cream, half-and-half, and cream cheese. Whole-fat or sweetened yogurt. Full-fat cheeses or blue cheese. Non-dairy creamers and whipped toppings. Processed cheese, cheese spreads, or cheese curds.  Condiments Onion and garlic salt, seasoned salt, table salt, and sea salt. Canned and packaged gravies. Worcestershire sauce. Tartar sauce. Barbecue sauce. Teriyaki sauce. Soy sauce, including reduced sodium. Steak sauce. Fish  sauce. Oyster sauce. Cocktail sauce. Horseradish. Ketchup and mustard. Meat flavorings and tenderizers. Bouillon cubes. Hot sauce. Tabasco sauce. Marinades. Taco seasonings. Relishes.  Fats and Oils Butter, stick margarine, lard, shortening and bacon fat. Coconut, palm kernel, or palm oils. Regular salad dressings.  Pickles and olives. Salted popcorn and pretzels.  The items listed above may not be a complete list of foods and beverages to avoid.

## 2016-03-15 LAB — LIPID PANEL
Cholesterol: 162 mg/dL (ref 125–200)
HDL: 49 mg/dL (ref >=40)
LDL CALC: 89 mg/dL (ref <130)
Total CHOL/HDL Ratio: 3.3 Ratio (ref <=5.0)
Triglycerides: 122 mg/dL (ref <150)
VLDL: 24 mg/dL (ref <30)

## 2016-03-15 LAB — CBC WITH DIFFERENTIAL/PLATELET
BASOS ABS: 0 {cells}/uL (ref 0–200)
Basophils Relative: 0 %
EOS ABS: 97 {cells}/uL (ref 15–500)
Eosinophils Relative: 1 %
HEMATOCRIT: 47.8 % (ref 38.5–50.0)
HEMOGLOBIN: 15.9 g/dL (ref 13.2–17.1)
Lymphocytes Relative: 39 %
Lymphs Abs: 3783 cells/uL (ref 850–3900)
MCH: 30.3 pg (ref 27.0–33.0)
MCHC: 33.3 g/dL (ref 32.0–36.0)
MCV: 91 fL (ref 80.0–100.0)
MPV: 11 fL (ref 7.5–12.5)
Monocytes Absolute: 679 cells/uL (ref 200–950)
Monocytes Relative: 7 %
NEUTROS ABS: 5141 {cells}/uL (ref 1500–7800)
Neutrophils Relative %: 53 %
Platelets: 215 10*3/uL (ref 140–400)
RBC: 5.25 MIL/uL (ref 4.20–5.80)
RDW: 13.9 % (ref 11.0–15.0)
WBC: 9.7 10*3/uL (ref 3.8–10.8)

## 2016-03-15 LAB — INSULIN, RANDOM: Insulin: 8.7 u[IU]/mL (ref 2.0–19.6)

## 2016-03-15 LAB — URINALYSIS, ROUTINE W REFLEX MICROSCOPIC
Bilirubin Urine: NEGATIVE
GLUCOSE, UA: NEGATIVE
HGB URINE DIPSTICK: NEGATIVE
Leukocytes, UA: NEGATIVE
Nitrite: NEGATIVE
PH: 5.5 (ref 5.0–8.0)
PROTEIN: NEGATIVE
Specific Gravity, Urine: 1.022 (ref 1.001–1.035)

## 2016-03-15 LAB — CELIAC DISEASE COMPREHENSIVE PANEL WITH REFLEXES
IgA: 269 mg/dL (ref 81–463)
Tissue Transglutaminase Ab, IgA: 1 U/mL (ref <4)

## 2016-03-15 LAB — HEPATIC FUNCTION PANEL
ALBUMIN: 4 g/dL (ref 3.6–5.1)
ALT: 12 U/L (ref 9–46)
AST: 15 U/L (ref 10–35)
Alkaline Phosphatase: 59 U/L (ref 40–115)
Bilirubin, Direct: 0.1 mg/dL (ref <=0.2)
Indirect Bilirubin: 0.2 mg/dL (ref 0.2–1.2)
Total Bilirubin: 0.3 mg/dL (ref 0.2–1.2)
Total Protein: 7 g/dL (ref 6.1–8.1)

## 2016-03-15 LAB — BASIC METABOLIC PANEL WITH GFR
BUN: 21 mg/dL (ref 7–25)
CHLORIDE: 102 mmol/L (ref 98–110)
CO2: 24 mmol/L (ref 20–31)
CREATININE: 1.07 mg/dL (ref 0.70–1.25)
Calcium: 9 mg/dL (ref 8.6–10.3)
GFR, Est African American: 84 mL/min (ref >=60)
GFR, Est Non African American: 72 mL/min (ref >=60)
GLUCOSE: 107 mg/dL — AB (ref 65–99)
Potassium: 4.1 mmol/L (ref 3.5–5.3)
Sodium: 137 mmol/L (ref 135–146)

## 2016-03-15 LAB — VITAMIN D 25 HYDROXY (VIT D DEFICIENCY, FRACTURES): VIT D 25 HYDROXY: 28 ng/mL — AB (ref 30–100)

## 2016-03-15 LAB — HEMOGLOBIN A1C
HEMOGLOBIN A1C: 5 % (ref <5.7)
Mean Plasma Glucose: 97 mg/dL

## 2016-03-15 LAB — TSH: TSH: 0.68 mIU/L (ref 0.40–4.50)

## 2016-03-15 LAB — VALPROIC ACID LEVEL: Valproic Acid Lvl: 60.4 ug/mL (ref 50.0–100.0)

## 2016-03-15 LAB — MAGNESIUM: MAGNESIUM: 2.1 mg/dL (ref 1.5–2.5)

## 2016-04-11 ENCOUNTER — Ambulatory Visit (INDEPENDENT_AMBULATORY_CARE_PROVIDER_SITE_OTHER): Payer: Medicare Other | Admitting: Internal Medicine

## 2016-04-11 ENCOUNTER — Encounter: Payer: Self-pay | Admitting: Internal Medicine

## 2016-04-11 VITALS — BP 162/98 | HR 78 | Temp 98.4°F | Resp 16 | Ht 68.0 in | Wt 176.0 lb

## 2016-04-11 DIAGNOSIS — I10 Essential (primary) hypertension: Secondary | ICD-10-CM | POA: Diagnosis not present

## 2016-04-11 MED ORDER — BISOPROLOL-HYDROCHLOROTHIAZIDE 5-6.25 MG PO TABS: ORAL_TABLET | ORAL | 1 refills | Status: DC

## 2016-04-11 NOTE — Progress Notes (Signed)
   Subjective:    Patient ID: Timothy [name], male    DOB: 09-22-1950, 65 y.o.   MRN: [id number]  HPI  This nice 65 yo DWM with labile HTN returns reporting home BP's have averaged about 150/95. He denies any sx's of HA's , dizziness, CP, palpitations, dyspnea or edema.      He also had a several month hx/o diarrhea and Celiac panel was negative and he repots his sx's have resolved or are controlled on Imodium tabs 2 tid.     Medication Sig   Imodium  Takes 2 tabs 3 x/day  . divalproex (DEPAKOTE) 250 MG DR tablet Take 500 mg by mouth 2 (two) times daily.   No Known Allergies  Past Medical History:  Diagnosis Date  . Arthritis   . Bipolar disorder ([place])   . Wears glasses    reading   Review of Systems  10 point systems review negative except as above.    Objective:   Physical Exam  BP (!) 162/98   Pulse 78   Temp 98.4 F (36.9 C) (Temporal)   Resp 16   Ht 5\' 8"  (1.727 m)   Wt 176 lb (79.8 kg)   BMI 26.76 kg/m   No formal exam today , but BP is rechecked at 140/97.      Assessment & Plan:   1. Essential hypertension  - bisoprolol-hydrochlorothiazide (ZIAC) 5-6.25 MG tablet; Take 1 tablet daily for BP  Dispense: 90 tablet; Refill: 1  - to monitor BP's and call if remain elevated >140/90.  - ROV 3 months

## 2016-06-09 DIAGNOSIS — Z85828 Personal history of other malignant neoplasm of skin: Secondary | ICD-10-CM | POA: Diagnosis not present

## 2016-06-09 DIAGNOSIS — D225 Melanocytic nevi of trunk: Secondary | ICD-10-CM | POA: Diagnosis not present

## 2016-06-09 DIAGNOSIS — D485 Neoplasm of uncertain behavior of skin: Secondary | ICD-10-CM | POA: Diagnosis not present

## 2016-06-09 DIAGNOSIS — Z87898 Personal history of other specified conditions: Secondary | ICD-10-CM | POA: Diagnosis not present

## 2016-06-09 DIAGNOSIS — Z23 Encounter for immunization: Secondary | ICD-10-CM | POA: Diagnosis not present

## 2016-06-13 DIAGNOSIS — D2261 Melanocytic nevi of right upper limb, including shoulder: Secondary | ICD-10-CM | POA: Diagnosis not present

## 2016-06-13 DIAGNOSIS — C44311 Basal cell carcinoma of skin of nose: Secondary | ICD-10-CM | POA: Diagnosis not present

## 2016-07-14 DIAGNOSIS — F322 Major depressive disorder, single episode, severe without psychotic features: Secondary | ICD-10-CM | POA: Diagnosis not present

## 2016-07-21 ENCOUNTER — Encounter: Payer: Self-pay | Admitting: Internal Medicine

## 2016-07-21 ENCOUNTER — Ambulatory Visit (INDEPENDENT_AMBULATORY_CARE_PROVIDER_SITE_OTHER): Payer: Medicare Other | Admitting: Internal Medicine

## 2016-07-21 VITALS — BP 132/74 | HR 64 | Temp 97.3°F | Resp 16 | Ht 68.0 in | Wt 174.0 lb

## 2016-07-21 DIAGNOSIS — F329 Major depressive disorder, single episode, unspecified: Secondary | ICD-10-CM | POA: Diagnosis not present

## 2016-07-21 DIAGNOSIS — I10 Essential (primary) hypertension: Secondary | ICD-10-CM

## 2016-07-21 DIAGNOSIS — B351 Tinea unguium: Secondary | ICD-10-CM | POA: Diagnosis not present

## 2016-07-21 DIAGNOSIS — E559 Vitamin D deficiency, unspecified: Secondary | ICD-10-CM

## 2016-07-21 DIAGNOSIS — Z79899 Other long term (current) drug therapy: Secondary | ICD-10-CM | POA: Diagnosis not present

## 2016-07-21 DIAGNOSIS — R7309 Other abnormal glucose: Secondary | ICD-10-CM | POA: Diagnosis not present

## 2016-07-21 DIAGNOSIS — E7800 Pure hypercholesterolemia, unspecified: Secondary | ICD-10-CM

## 2016-07-21 DIAGNOSIS — F32A Depression, unspecified: Secondary | ICD-10-CM | POA: Insufficient documentation

## 2016-07-21 MED ORDER — TERBINAFINE HCL 250 MG PO TABS: ORAL_TABLET | ORAL | 0 refills | Status: DC

## 2016-07-21 NOTE — Patient Instructions (Signed)

## 2016-07-21 NOTE — Progress Notes (Signed)
[facility] [name], M.D.        Uvaldo Bristle. Silverio Lay, P.A.-C       Starlyn Skeans, P.A.-C  [facility]                [address] [place], [place] [id number] Telephone [phone] Telefax [phone] ______________________________________________________________________     This very nice 66 y.o. DWM presents for 3 month follow up with Hypertension, Hyperlipidemia, Pre-Diabetes and Vitamin D Deficiency. Patient is on Depakote followed by Dr Clovis Pu for Depression.      Patient is followed for HTN & BP has been controlled at home. Today's BP is at goal - 132/74. Patient has had no complaints of any cardiac type chest pain, palpitations, dyspnea/orthopnea/PND, dizziness, claudication, or dependent edema.     Hyperlipidemia is controlled with diet & meds. Patient denies myalgias or other med SE's. Last Lipids were at goal: Lab Results  Component Value Date   CHOL 162 03/14/2016   HDL 49 03/14/2016   LDLCALC 89 03/14/2016   TRIG 122 03/14/2016   CHOLHDL 3.3 03/14/2016      Also, the patient is screened expectantly for PreDiabetes and has had no symptoms of reactive hypoglycemia, diabetic polys, paresthesias or visual blurring.  Last A1c was at goal: Lab Results  Component Value Date   HGBA1C 5.0 03/14/2016      Further, the patient also has history of Vitamin D Deficiency and supplements vitamin D without any suspected side-effects. Last vitamin D was low and since has been on 10,000 units/day:   Lab Results  Component Value Date   VD25OH 28 (L) 03/14/2016   Current Outpatient Prescriptions on File Prior to Visit  Medication Sig  . bisoprolol-hydrochlorothiazide (ZIAC) 5-6.25 MG tablet Take 1 tablet daily for BP  . divalproex (DEPAKOTE) 250 MG DR tablet Take 500 mg by mouth 2 (two) times daily.  Marland Kitchen loperamide (IMODIUM A-D) 2 MG tablet Take 2 mg by mouth 4 (four) times daily as needed for  diarrhea or loose stools.   No current facility-administered medications on file prior to visit.    No Known Allergies PMHx:   Past Medical History:  Diagnosis Date  . Arthritis   . Bipolar disorder ([place])   . Wears glasses    reading    There is no immunization history on file for this patient. Past Surgical History:  Procedure Laterality Date  . APPENDECTOMY    . COLONOSCOPY    . KNEE ARTHROSCOPY W/ MENISCAL REPAIR  2004   left  . ORIF HUMERUS FRACTURE Left 09/19/2013   Procedure: LEFT OPEN REDUCTION INTERNAL FIXATION (ORIF) PROXIMAL HUMERUS; ROTATOR CUFF REPAIR;  Surgeon: [name], MD;  Location: [facility];  Service: Orthopedics;  Laterality: Left;  . TONSILLECTOMY     FHx:    Reviewed / unchanged  SHx:    Reviewed / unchanged  Systems Review:  Constitutional: Denies fever, chills, wt changes, headaches, insomnia, fatigue, night sweats, change in appetite. Eyes: Denies redness, blurred vision, diplopia, discharge, itchy, watery eyes.  ENT: Denies discharge, congestion, post nasal drip, epistaxis, sore throat, earache, hearing loss, dental pain, tinnitus, vertigo, sinus pain, snoring.  CV: Denies chest pain, palpitations, irregular heartbeat, syncope, dyspnea, diaphoresis, orthopnea, PND, claudication or edema. Respiratory: denies cough, dyspnea, DOE, pleurisy, hoarseness, laryngitis, wheezing.  Gastrointestinal: Denies dysphagia, odynophagia, heartburn, reflux, water brash, abdominal pain  or cramps, nausea, vomiting, bloating, diarrhea, constipation, hematemesis, melena, hematochezia  or hemorrhoids. Genitourinary: Denies dysuria, frequency, urgency, nocturia, hesitancy, discharge, hematuria or flank pain. Musculoskeletal: Denies arthralgias, myalgias, stiffness, jt. swelling, pain, limping or strain/sprain.  Skin: Denies pruritus, rash, hives, warts, acne, eczema or change in skin lesion(s). Neuro: No weakness, tremor, incoordination, spasms,  paresthesia or pain. Psychiatric: Denies confusion, memory loss or sensory loss. Endo: Denies change in weight, skin or hair change.  Heme/Lymph: No excessive bleeding, bruising or enlarged lymph nodes.  Physical Exam  BP 132/74   Pulse 64   Temp 97.3 F (36.3 C)   Resp 16   Ht 5\' 8"  (1.727 m)   Wt 174 lb (78.9 kg)   BMI 26.46 kg/m   Appears well nourished and in no distress.  Eyes: PERRLA, EOMs, conjunctiva no swelling or erythema. Sinuses: No frontal/maxillary tenderness ENT/Mouth: EAC's clear, TM's nl w/o erythema, bulging. Nares clear w/o erythema, swelling, exudates. Oropharynx clear without erythema or exudates. Oral hygiene is good. Tongue normal, non obstructing. Hearing intact.  Neck: Supple. Thyroid nl. Car 2+/2+ without bruits, nodes or JVD. Chest: Respirations nl with BS clear & equal w/o rales, rhonchi, wheezing or stridor.  Cor: Heart sounds normal w/ regular rate and rhythm without sig. murmurs, gallops, clicks, or rubs. Peripheral pulses normal and equal  without edema.  Abdomen: Soft & bowel sounds normal. Non-tender w/o guarding, rebound, hernias, masses, or organomegaly.  Lymphatics: Unremarkable.  Musculoskeletal: Full ROM all peripheral extremities, joint stability, 5/5 strength, and normal gait.  Skin: Warm, dry without exposed rashes, lesions or ecchymosis apparent. Has 3-4 dystrophic chalky thickened toenails.  Neuro: Cranial nerves intact, reflexes equal bilaterally. Sensory-motor testing grossly intact. Tendon reflexes grossly intact.  Pysch: Alert & oriented x 3.  Insight and judgement nl & appropriate. No ideations.  Assessment and Plan:  1. Essential hypertension  - Continue medication, monitor blood pressure at home.  - Continue DASH diet. Reminder to go to the ER if any CP.  - CBC with Differential/Platelet - BASIC METABOLIC PANEL WITH GFR - TSH  2. Elevated cholesterol screening  - Continue diet, exercise & lifestyle modifications.  -  Continue monitor periodic cholesterol/liver & renal functions   - Hepatic function panel - Lipid panel - TSH  3. Other abnormal glucose  - Continue diet, exercise, lifestyle modifications.  - Monitor appropriate labs.  - Hemoglobin A1c - Insulin, random  4. Vitamin D deficiency  - Continue supplementation.  - VITAMIN D 25 Hydroxy   5. Depression  - Valproic acid level  6. Onychomycosis of toenail  - terbinafine  250 MG ; Take 1 tablet daily for toenail fungus  Disp: 90 ; Rf: 0 - Lab visit in 6 weeks to check HFP  7. Medication management  - CBC with Differential/Platelet - BASIC METABOLIC PANEL WITH GFR - Hepatic function panel - Magnesium - Lipid panel - TSH - Hemoglobin A1c - Insulin, random - VITAMIN D 25 Hydroxy - Valproic acid level - ROV - CPE 6 mo       Recommended regular exercise, BP monitoring, weight control, and discussed med and SE's. Recommended labs to assess and monitor clinical status. Further disposition pending results of labs. Over 30 minutes of exam, counseling, chart review was performed

## 2016-07-22 LAB — BASIC METABOLIC PANEL WITH GFR
BUN: 22 mg/dL (ref 7–25)
CALCIUM: 9.6 mg/dL (ref 8.6–10.3)
CO2: 29 mmol/L (ref 20–31)
CREATININE: 0.96 mg/dL (ref 0.70–1.25)
Chloride: 101 mmol/L (ref 98–110)
GFR, EST NON AFRICAN AMERICAN: 83 mL/min (ref >=60)
GFR, Est African American: >89 mL/min (ref >=60)
Glucose, Bld: 98 mg/dL (ref 65–99)
POTASSIUM: 4.9 mmol/L (ref 3.5–5.3)
SODIUM: 141 mmol/L (ref 135–146)

## 2016-07-22 LAB — CBC WITH DIFFERENTIAL/PLATELET
BASOS ABS: 0 {cells}/uL (ref 0–200)
Basophils Relative: 0 %
EOS PCT: 1 %
Eosinophils Absolute: 103 cells/uL (ref 15–500)
HCT: 46.6 % (ref 38.5–50.0)
HEMOGLOBIN: 15.3 g/dL (ref 13.2–17.1)
LYMPHS ABS: 3708 {cells}/uL (ref 850–3900)
Lymphocytes Relative: 36 %
MCH: 30.5 pg (ref 27.0–33.0)
MCHC: 32.8 g/dL (ref 32.0–36.0)
MCV: 92.8 fL (ref 80.0–100.0)
MONOS PCT: 7 %
MPV: 11.3 fL (ref 7.5–12.5)
Monocytes Absolute: 721 cells/uL (ref 200–950)
Neutro Abs: 5768 cells/uL (ref 1500–7800)
Neutrophils Relative %: 56 %
PLATELETS: 243 10*3/uL (ref 140–400)
RBC: 5.02 MIL/uL (ref 4.20–5.80)
RDW: 13.8 % (ref 11.0–15.0)
WBC: 10.3 10*3/uL (ref 3.8–10.8)

## 2016-07-22 LAB — LIPID PANEL
CHOLESTEROL: 144 mg/dL (ref <200)
HDL: 49 mg/dL (ref >40)
LDL CALC: 70 mg/dL (ref <100)
Total CHOL/HDL Ratio: 2.9 Ratio (ref <5.0)
Triglycerides: 127 mg/dL (ref <150)
VLDL: 25 mg/dL (ref <30)

## 2016-07-22 LAB — TSH: TSH: 0.48 mIU/L (ref 0.40–4.50)

## 2016-07-22 LAB — HEMOGLOBIN A1C
Hgb A1c MFr Bld: 5.1 % (ref <5.7)
MEAN PLASMA GLUCOSE: 100 mg/dL

## 2016-07-22 LAB — HEPATIC FUNCTION PANEL
ALBUMIN: 4.2 g/dL (ref 3.6–5.1)
ALT: 15 U/L (ref 9–46)
AST: 16 U/L (ref 10–35)
Alkaline Phosphatase: 50 U/L (ref 40–115)
Bilirubin, Direct: 0.1 mg/dL (ref <=0.2)
Indirect Bilirubin: 0.3 mg/dL (ref 0.2–1.2)
TOTAL PROTEIN: 7.2 g/dL (ref 6.1–8.1)
Total Bilirubin: 0.4 mg/dL (ref 0.2–1.2)

## 2016-07-22 LAB — INSULIN, RANDOM: INSULIN: 12.2 u[IU]/mL (ref 2.0–19.6)

## 2016-07-22 LAB — VALPROIC ACID LEVEL: Valproic Acid Lvl: 47.4 ug/mL — ABNORMAL LOW (ref 50.0–100.0)

## 2016-07-22 LAB — MAGNESIUM: Magnesium: 2 mg/dL (ref 1.5–2.5)

## 2016-07-22 LAB — VITAMIN D 25 HYDROXY (VIT D DEFICIENCY, FRACTURES): Vit D, 25-Hydroxy: 78 ng/mL (ref 30–100)

## 2016-08-01 ENCOUNTER — Telehealth: Payer: Self-pay | Admitting: *Deleted

## 2016-08-01 NOTE — Telephone Encounter (Signed)
Patient called and reported that on 07/29/2016, he had an episode of not being able to speak for a few minutes. The patient states he does not any problems following episode, such as numbness, weakness or dropping of the face.  Pert Dr Melford Aase, that is nothing do presently, but sure the patient is taking ASA 81 mg daily.  Patient states he started the ASA 81 mg last week and will call back if symptom reoccurs.

## 2016-08-05 DIAGNOSIS — C44319 Basal cell carcinoma of skin of other parts of face: Secondary | ICD-10-CM | POA: Diagnosis not present

## 2016-09-01 ENCOUNTER — Ambulatory Visit (INDEPENDENT_AMBULATORY_CARE_PROVIDER_SITE_OTHER): Payer: Medicare Other | Admitting: Internal Medicine

## 2016-09-01 ENCOUNTER — Other Ambulatory Visit: Payer: Self-pay

## 2016-09-01 ENCOUNTER — Encounter: Payer: Self-pay | Admitting: Internal Medicine

## 2016-09-01 VITALS — BP 120/62 | HR 58 | Temp 98.6°F | Resp 14 | Ht 68.0 in | Wt 176.0 lb

## 2016-09-01 DIAGNOSIS — Z79899 Other long term (current) drug therapy: Secondary | ICD-10-CM | POA: Diagnosis not present

## 2016-09-01 DIAGNOSIS — I10 Essential (primary) hypertension: Secondary | ICD-10-CM | POA: Diagnosis not present

## 2016-09-01 DIAGNOSIS — G459 Transient cerebral ischemic attack, unspecified: Secondary | ICD-10-CM | POA: Diagnosis not present

## 2016-09-01 DIAGNOSIS — F329 Major depressive disorder, single episode, unspecified: Secondary | ICD-10-CM

## 2016-09-01 DIAGNOSIS — R4701 Aphasia: Secondary | ICD-10-CM | POA: Diagnosis not present

## 2016-09-01 DIAGNOSIS — Z0000 Encounter for general adult medical examination without abnormal findings: Secondary | ICD-10-CM

## 2016-09-01 DIAGNOSIS — F32A Depression, unspecified: Secondary | ICD-10-CM

## 2016-09-01 DIAGNOSIS — Z0001 Encounter for general adult medical examination with abnormal findings: Secondary | ICD-10-CM | POA: Diagnosis not present

## 2016-09-01 DIAGNOSIS — B351 Tinea unguium: Secondary | ICD-10-CM

## 2016-09-01 DIAGNOSIS — R6889 Other general symptoms and signs: Secondary | ICD-10-CM | POA: Diagnosis not present

## 2016-09-01 DIAGNOSIS — R197 Diarrhea, unspecified: Secondary | ICD-10-CM

## 2016-09-01 LAB — HEPATIC FUNCTION PANEL
ALT: 14 U/L (ref 9–46)
AST: 17 U/L (ref 10–35)
Albumin: 4.1 g/dL (ref 3.6–5.1)
Alkaline Phosphatase: 45 U/L (ref 40–115)
BILIRUBIN TOTAL: 0.5 mg/dL (ref 0.2–1.2)
Bilirubin, Direct: 0.1 mg/dL (ref <=0.2)
Indirect Bilirubin: 0.4 mg/dL (ref 0.2–1.2)
Total Protein: 6.9 g/dL (ref 6.1–8.1)

## 2016-09-01 MED ORDER — TERBINAFINE HCL 250 MG PO TABS: ORAL_TABLET | ORAL | 0 refills | Status: AC

## 2016-09-01 NOTE — Progress Notes (Signed)
MEDICARE ANNUAL WELLNESS VISIT AND FOLLOW UP Assessment:   1. Transient cerebral ischemia, unspecified type -patient already of baby asa -cont exercise -cholesterol well controlled -has never had carotid dopplers will get dopplers done -TSH normal -possible anxiety as the source of aphasia  2. Onychomycosis of toenail -if normal LFTs continue medication every other month - terbinafine (LAMISIL) 250 MG tablet; Take 1 tablet daily for toenail fungus  Dispense: 90 tablet; Refill: 0  3. Expressive aphasia - VAS US CAROTID; Future  4. Medication management  - Hepatic function panel  5.  Essential hypertension -normal BP -cont medications  6.  Depression -managed by psychiatry -cont depakote  7.  Diarrhea -cont immodium as needed   Over 30 minutes of exam, counseling, chart review, and critical decision making was performed  Future Appointments Date Time Provider [place]  09/07/2016 12:00 PM [facility] 2 [facility]  04/05/2017 3:00 PM [name], MD [facility] None     Plan:   During the course of the visit the patient was educated and counseled about appropriate screening and preventive services including:    Pneumococcal vaccine   Influenza vaccine  Prevnar 13  Td vaccine  Screening electrocardiogram  Colorectal cancer screening  Diabetes screening  Glaucoma screening  Nutrition counseling    Subjective:  Timothy [name] is a 66 y.o. male who presents for Medicare Annual Wellness Visit and 3 month follow up for HTN, hyperlipidemia, prediabetes, and vitamin D Def.   His blood pressure has been controlled at home, today their BP is BP: 120/62 He does workout. He denies chest pain, shortness of breath, dizziness.   He is seen by Dr. Clovis Pu for depression for which he takes depakote.  He is doing well with his medications currently.   He is on lamasil for onychomycosis.  He has found that he is having good clearance of his nails.   He   Last GFR Lab Results  Component Value Date   [facility] 83 07/21/2016     Lab Results  Component Value Date   GFRAA >89 07/21/2016   Patient is on Vitamin D supplement.   Lab Results  Component Value Date   VD25OH 78 07/21/2016      Medication Review: Current Outpatient Prescriptions on File Prior to Visit  Medication Sig Dispense Refill  . bisoprolol-hydrochlorothiazide (ZIAC) 5-6.25 MG tablet Take 1 tablet daily for BP 90 tablet 1  . divalproex (DEPAKOTE) 250 MG DR tablet Take 500 mg by mouth 2 (two) times daily.  4  . loperamide (IMODIUM A-D) 2 MG tablet Take 2 mg by mouth 4 (four) times daily as needed for diarrhea or loose stools.     No current facility-administered medications on file prior to visit.     Allergies: No Known Allergies  Current Problems (verified) has Fracture of proximal end of left humerus; HTN (hypertension); Vitamin D deficiency; Elevated cholesterol; Other abnormal glucose; Diarrhea; Medication management; and Depression on his problem list.  Screening Tests  There is no immunization history on file for this patient.  Preventative care: Last colonoscopy: Spring 2017  Names of Other Physician/Practitioners you currently use: 1. [facility] here for primary care 2. Does not see somebody regularly, eye doctor, last visit 7 years ago 3. Dr. Orvil Feil, dentist, last visit 2017 Patient Care Team: [name], MD as PCP - General (Internal Medicine)  Surgical: He  has a past surgical history that includes Tonsillectomy; Appendectomy; Colonoscopy; Knee arthroscopy w/ meniscal repair (  2004); and ORIF humerus fracture (Left, 09/19/2013). Family His family history is not on file. Social history  He reports that he has never smoked. He has never used smokeless tobacco. He reports that he drinks alcohol. He reports that he does not use drugs.  MEDICARE WELLNESS OBJECTIVES: Physical activity: Current Exercise  Habits: Structured exercise class, Type of exercise: calisthenics (shoulder physical therapy), Time (Minutes): > 60, Frequency (Times/Week): 5, Weekly Exercise (Minutes/Week): 0, Intensity: Moderate Cardiac risk factors: Cardiac Risk Factors include: advanced age (>58men, >16 women);hypertension;male gender Depression/mood screen:   Depression screen [facility] 2/9 09/01/2016  Decreased Interest 0  Down, Depressed, Hopeless 0  PHQ - 2 Score 0    ADLs:  In your present state of health, do you have any difficulty performing the following activities: 09/01/2016 07/21/2016  Hearing? N N  Vision? N N  Difficulty concentrating or making decisions? N N  Walking or climbing stairs? N N  Dressing or bathing? N N  Doing errands, shopping? N N  Preparing Food and eating ? N -  Using the Toilet? N -  Do you have problems with loss of bowel control? N -  Managing your Medications? N -  Managing your Finances? N -  Housekeeping or managing your Housekeeping? N -  Some recent data might be hidden     Cognitive Testing  Alert? Yes  Normal Appearance?Yes  Oriented to person? Yes  Place? Yes   Time? Yes  Recall of three objects?  Yes  Can perform simple calculations? Yes  Displays appropriate judgment?Yes  Can read the correct time from a watch face?Yes  EOL planning: Does Patient Have a Medical Advance Directive?: Yes Type of Advance Directive: Healthcare Power of Attorney, Living will Does patient want to make changes to medical advance directive?: Yes (MAU/Ambulatory/Procedural Areas - Information given) Copy of [place] in Chart?: No - copy requested   Objective:   Today's Vitals   09/01/16 1443  BP: 120/62  Pulse: (!) 58  Resp: 14  Temp: 98.6 F (37 C)  TempSrc: Temporal  Weight: 176 lb (79.8 kg)  Height: 5\' 8"  (1.727 m)   Body mass index is 26.76 kg/m.  General appearance: alert, no distress, WD/WN, male HEENT: normocephalic, sclerae anicteric, TMs pearly, nares  patent, no discharge or erythema, pharynx normal Oral cavity: MMM, no lesions Neck: supple, no lymphadenopathy, no thyromegaly, no masses Heart: RRR, normal S1, S2, no murmurs Lungs: CTA bilaterally, no wheezes, rhonchi, or rales Abdomen: +bs, soft, non tender, non distended, no masses, no hepatomegaly, no splenomegaly Musculoskeletal: nontender, no swelling, no obvious deformity Extremities: no edema, no cyanosis, no clubbing Pulses: 2+ symmetric, upper and lower extremities, normal cap refill Neurological: alert, oriented x 3, CN2-12 intact, strength normal upper extremities and lower extremities, sensation normal throughout, DTRs 2+ throughout, no cerebellar signs, gait normal Psychiatric: normal affect, behavior normal, pleasant   Medicare Attestation I have personally reviewed: The patient's medical and social history Their use of alcohol, tobacco or illicit drugs Their current medications and supplements The patient's functional ability including ADLs,fall risks, home safety risks, cognitive, and hearing and visual impairment Diet and physical activities Evidence for depression or mood disorders  The patient's weight, height, BMI, and visual acuity have been recorded in the chart.  I have made referrals, counseling, and provided education to the patient based on review of the above and I have provided the patient with a written personalized care plan for preventive services.     Loma Sousa  Forcucci, PA-C   09/04/2016

## 2016-09-07 ENCOUNTER — Ambulatory Visit (HOSPITAL_COMMUNITY)
Admission: RE | Admit: 2016-09-07 | Discharge: 2016-09-07 | Disposition: A | Discharge status: Home / Self Care | Payer: Medicare Other | Source: Ambulatory Visit | Attending: Cardiology | Admitting: Cardiology

## 2016-09-07 DIAGNOSIS — I6523 Occlusion and stenosis of bilateral carotid arteries: Secondary | ICD-10-CM | POA: Insufficient documentation

## 2016-09-07 DIAGNOSIS — R4701 Aphasia: Secondary | ICD-10-CM | POA: Insufficient documentation

## 2016-10-02 ENCOUNTER — Other Ambulatory Visit: Payer: Self-pay | Admitting: Internal Medicine

## 2016-10-02 DIAGNOSIS — I10 Essential (primary) hypertension: Secondary | ICD-10-CM

## 2017-03-25 ENCOUNTER — Other Ambulatory Visit: Payer: Self-pay | Admitting: Internal Medicine

## 2017-03-25 DIAGNOSIS — I10 Essential (primary) hypertension: Secondary | ICD-10-CM

## 2017-04-05 ENCOUNTER — Ambulatory Visit (INDEPENDENT_AMBULATORY_CARE_PROVIDER_SITE_OTHER): Payer: Medicare Other | Admitting: Internal Medicine

## 2017-04-05 ENCOUNTER — Encounter: Payer: Self-pay | Admitting: Internal Medicine

## 2017-04-05 VITALS — BP 128/84 | HR 60 | Temp 97.3°F | Resp 16 | Ht 69.0 in | Wt 178.0 lb

## 2017-04-05 DIAGNOSIS — R7309 Other abnormal glucose: Secondary | ICD-10-CM

## 2017-04-05 DIAGNOSIS — Z23 Encounter for immunization: Secondary | ICD-10-CM

## 2017-04-05 DIAGNOSIS — Z125 Encounter for screening for malignant neoplasm of prostate: Secondary | ICD-10-CM | POA: Diagnosis not present

## 2017-04-05 DIAGNOSIS — Z79899 Other long term (current) drug therapy: Secondary | ICD-10-CM | POA: Diagnosis not present

## 2017-04-05 DIAGNOSIS — Z136 Encounter for screening for cardiovascular disorders: Secondary | ICD-10-CM | POA: Diagnosis not present

## 2017-04-05 DIAGNOSIS — E559 Vitamin D deficiency, unspecified: Secondary | ICD-10-CM | POA: Diagnosis not present

## 2017-04-05 DIAGNOSIS — Z1212 Encounter for screening for malignant neoplasm of rectum: Secondary | ICD-10-CM

## 2017-04-05 DIAGNOSIS — E782 Mixed hyperlipidemia: Secondary | ICD-10-CM | POA: Diagnosis not present

## 2017-04-05 DIAGNOSIS — Z0001 Encounter for general adult medical examination with abnormal findings: Secondary | ICD-10-CM

## 2017-04-05 DIAGNOSIS — I10 Essential (primary) hypertension: Secondary | ICD-10-CM | POA: Diagnosis not present

## 2017-04-05 DIAGNOSIS — N401 Enlarged prostate with lower urinary tract symptoms: Secondary | ICD-10-CM | POA: Diagnosis not present

## 2017-04-05 DIAGNOSIS — Z1211 Encounter for screening for malignant neoplasm of colon: Secondary | ICD-10-CM

## 2017-04-05 DIAGNOSIS — F329 Major depressive disorder, single episode, unspecified: Secondary | ICD-10-CM | POA: Diagnosis not present

## 2017-04-05 DIAGNOSIS — F32A Depression, unspecified: Secondary | ICD-10-CM

## 2017-04-05 MED ORDER — BISOPROLOL FUMARATE 5 MG PO TABS: ORAL_TABLET | ORAL | 1 refills | Status: DC

## 2017-04-05 NOTE — Patient Instructions (Signed)

## 2017-04-05 NOTE — Addendum Note (Signed)
Addended by: [name] on: 04/05/2017 11:40 PM   Modules accepted: Level of Service

## 2017-04-05 NOTE — Progress Notes (Addendum)
[facility]   [name], M.D.     Uvaldo Bristle. Silverio Lay, P.A.-C Liane Comber, [address]                [place], [place] [address] Telephone [phone] Telefax [phone] Preventative & Comprehensive Evaluation & Examination     This very nice 66 y.o. DWM presents for a Preventative &  comprehensive evaluation and management of multiple medical co-morbidities.  Patient has been followed  for labile HTN, Prediabetes, Hyperlipidemia and Vitamin D Deficiency.  Patient  followed by Dr Clovis Pu for Depression & is on Depakote. Patient had a colonoscopy (May 2017) with a benign polyp per Dr Amedeo Plenty.      Patient reports having a "bronchial" infection an=bout a mont ago near recovering , but continues with a persistent dry non productive cough.      HTN predates since Nov 2017. Patient's BP has been controlled at home.  Today's BP is at goal - 128/84. Patient denies any cardiac symptoms as chest pain, palpitations, shortness of breath, dizziness or ankle swelling.     Patient's hyperlipidemia is controlled with diet and medications. Patient denies myalgias or other medication SE's. Last lipids were at goal: Lab Results  Component Value Date   CHOL 144 07/21/2016   HDL 49 07/21/2016   LDLCALC 70 07/21/2016   TRIG 127 07/21/2016   CHOLHDL 2.9 07/21/2016      Patient is monitored expectantly for  prediabetes and patient denies reactive hypoglycemic symptoms, visual blurring, diabetic polys or paresthesias. Last A1c was at goal: Lab Results  Component Value Date   HGBA1C 5.1 07/21/2016       Finally, patient has history of Vitamin D Deficiency ("28" in Oct 2017)  and last vitamin D was at goal: Lab Results  Component Value Date   VD25OH 78 07/21/2016   Current Outpatient Medications on File Prior to Visit  Medication Sig  . divalproex (DEPAKOTE) 250 MG DR tablet Take 500 mg by mouth 2  (two) times daily.  Marland Kitchen loperamide (IMODIUM A-D) 2 MG tablet Take 2 mg by mouth 4 (four) times daily as needed for diarrhea or loose stools.   No current facility-administered medications on file prior to visit.    No Known Allergies   Past Medical History:  Diagnosis Date  . Arthritis   . Bipolar disorder ([place])   . Wears glasses    reading   Health Maintenance  Topic Date Due  . Hepatitis C Screening  12/11/1950  . TETANUS/TDAP  07/27/1969  . COLONOSCOPY  07/27/2000  . PNA vac Low Risk Adult (1 of 2 - PCV13) 07/28/2015  . INFLUENZA VACCINE  12/28/2016   Immunization History  Administered Date(s) Administered  . Pneumococcal Conjugate-13 04/05/2017   Past Surgical History:  Procedure Laterality Date  . APPENDECTOMY    . COLONOSCOPY    . KNEE ARTHROSCOPY W/ MENISCAL REPAIR  2004   left  . TONSILLECTOMY     Social History   Socioeconomic History  . Marital status: Legally Separated  Occupational History  . [employer]  Tobacco Use  . Smoking status: Never Smoker  . Smokeless tobacco: Never Used  Substance and Sexual Activity  . Alcohol use: Yes    Comment: 5 days a week  . Drug use: No  . Sexual activity: Active     ROS Constitutional: Denies  fever, chills, weight loss/gain, headaches, insomnia,  night sweats or change in appetite. Does c/o fatigue. Eyes: Denies redness, blurred vision, diplopia, discharge, itchy or watery eyes.  ENT: Denies discharge, congestion, post nasal drip, epistaxis, sore throat, earache, hearing loss, dental pain, Tinnitus, Vertigo, Sinus pain or snoring.  Cardio: Denies chest pain, palpitations, irregular heartbeat, syncope, dyspnea, diaphoresis, orthopnea, PND, claudication or edema Respiratory: denies cough, dyspnea, DOE, pleurisy, hoarseness, laryngitis or wheezing.  Gastrointestinal: Denies dysphagia, heartburn, reflux, water brash, pain, cramps, nausea, vomiting, bloating, diarrhea, constipation, hematemesis, melena, hematochezia, jaundice  or hemorrhoids Genitourinary: Denies dysuria, frequency, urgency, nocturia, hesitancy, discharge, hematuria or flank pain Musculoskeletal: Denies arthralgia, myalgia, stiffness, Jt. Swelling, pain, limp or strain/sprain. Denies Falls. Skin: Denies puritis, rash, hives, warts, acne, eczema or change in skin lesion Neuro: No weakness, tremor, incoordination, spasms, paresthesia or pain Psychiatric: Denies confusion, memory loss or sensory loss. Denies Depression. Endocrine: Denies change in weight, skin, hair change, nocturia, and paresthesia, diabetic polys, visual blurring or hyper / hypo glycemic episodes.  Heme/Lymph: No excessive bleeding, bruising or enlarged lymph nodes.  Physical Exam  BP 128/84   Pulse 60   Temp (!) 97.3 F (36.3 C)   Resp 16   Ht 5\' 9"  (1.753 m)   Wt 178 lb (80.7 kg)   BMI 26.29 kg/m   General Appearance: Well nourished and well groomed and in no apparent distress.  Eyes: PERRLA, EOMs, conjunctiva no swelling or erythema, normal fundi and vessels. Sinuses: No frontal/maxillary tenderness ENT/Mouth: EACs patent / TMs  nl. Nares clear without erythema, swelling, mucoid exudates. Oral hygiene is good. No erythema, swelling, or exudate. Tongue normal, non-obstructing. Tonsils not swollen or erythematous. Hearing normal.  Neck: Supple, thyroid normal. No bruits, nodes or JVD. Respiratory: Respiratory effort normal.  BS equal and clear bilateral without rales, rhonci, wheezing or stridor. Cardio: Heart sounds are normal with regular rate and rhythm and no murmurs, rubs or gallops. Peripheral pulses are normal and equal bilaterally without edema. No aortic or femoral bruits. Chest: symmetric with normal excursions and percussion.  Abdomen: Soft, with Nl bowel sounds. Nontender, no guarding, rebound, hernias, masses, or organomegaly.  Lymphatics: Non tender without lymphadenopathy.  Genitourinary: No hernias.Testes nl. DRE - prostate nl for age - smooth & firm w/o  nodules. Musculoskeletal: Full ROM all peripheral extremities, joint stability, 5/5 strength, and normal gait. Skin: Warm and dry without rashes, lesions, cyanosis, clubbing or  ecchymosis.  Neuro: Cranial nerves intact, reflexes equal bilaterally. Normal muscle tone, no cerebellar symptoms. Sensation intact.  Pysch: Alert and oriented x 3 with normal affect, insight and judgment appropriate.   Assessment and Plan  1. Essential hypertension  - Switch Ziac 5 to Zebeta 5 for c/ooccasional transient  postural lightheadedness  - EKG 12-Lead - [place], RETROPERITNL ABD,  LTD - Urinalysis, Routine w reflex microscopic - Microalbumin / creatinine urine ratio - CBC with Differential/Platelet - BASIC METABOLIC PANEL WITH GFR - Magnesium - TSH  2. Hyperlipidemia, mixed  - EKG 12-Lead - [place], RETROPERITNL ABD,  LTD - Hepatic function panel - Lipid panel - TSH  3. Other abnormal glucose  - Hemoglobin A1c - Insulin, random  4. Vitamin D deficiency  - VITAMIN D 25 Hydroxy   5. Depression  - Valproic acid level  6. Screening for AAA (aortic abdominal aneurysm)  - [place], RETROPERITNL ABD,  LTD  7. Screening for ischemic heart disease  - EKG 12-Lead  8. Screening for colorectal cancer  - POC Hemoccult Bld/Stl  9. Prostate cancer screening  - PSA  10. Benign localized prostatic hyperplasia with lower urinary tract symptoms (LUTS)  - PSA  11. Medication management  - Urinalysis, Routine w reflex microscopic - Microalbumin / creatinine urine ratio - CBC with Differential/Platelet - BASIC METABOLIC PANEL WITH GFR - Hepatic function panel - Magnesium - Lipid panel - TSH - Hemoglobin A1c - Insulin, random - VITAMIN D 25 Hydroxy  - Valproic acid level  12. Need for prophylactic vaccination against Streptococcus pneumoniae (pneumococcus)  - Pneumococcal conjugate vaccine 13-valent          Patient was given # 3 sx's of Breo (14 doses) to try for his cough.  Patient was  counseled in prudent diet, weight control to achieve/maintain BMI less than 25, BP monitoring, regular exercise and medications as discussed.  Discussed med effects and SE's. Routine screening labs and tests as requested with regular follow-up as recommended. Over 40 minutes of exam, counseling, chart review and high complex critical decision making was performed

## 2017-04-06 LAB — MICROALBUMIN / CREATININE URINE RATIO
CREATININE, URINE: 187 mg/dL (ref 20–320)
MICROALB UR: 0.7 mg/dL
Microalb Creat Ratio: 4 mcg/mg creat (ref <30)

## 2017-04-06 LAB — CBC WITH DIFFERENTIAL/PLATELET
Basophils Absolute: 31 cells/uL (ref 0–200)
Basophils Relative: 0.4 %
EOS PCT: 1.2 %
Eosinophils Absolute: 92 cells/uL (ref 15–500)
HEMATOCRIT: 44.1 % (ref 38.5–50.0)
Hemoglobin: 14.9 g/dL (ref 13.2–17.1)
LYMPHS ABS: 2872 {cells}/uL (ref 850–3900)
MCH: 30.5 pg (ref 27.0–33.0)
MCHC: 33.8 g/dL (ref 32.0–36.0)
MCV: 90.4 fL (ref 80.0–100.0)
MPV: 12 fL (ref 7.5–12.5)
Monocytes Relative: 9 %
Neutro Abs: 4012 cells/uL (ref 1500–7800)
Neutrophils Relative %: 52.1 %
PLATELETS: 214 10*3/uL (ref 140–400)
RBC: 4.88 10*6/uL (ref 4.20–5.80)
RDW: 12.1 % (ref 11.0–15.0)
TOTAL LYMPHOCYTE: 37.3 %
WBC: 7.7 10*3/uL (ref 3.8–10.8)
WBCMIX: 693 {cells}/uL (ref 200–950)

## 2017-04-06 LAB — BASIC METABOLIC PANEL WITH GFR
BUN: 21 mg/dL (ref 7–25)
CALCIUM: 9.4 mg/dL (ref 8.6–10.3)
CHLORIDE: 102 mmol/L (ref 98–110)
CO2: 29 mmol/L (ref 20–32)
Creat: 0.84 mg/dL (ref 0.70–1.25)
GFR, EST AFRICAN AMERICAN: 106 mL/min/{1.73_m2} (ref >=60)
GFR, Est Non African American: 91 mL/min/{1.73_m2} (ref >=60)
Glucose, Bld: 98 mg/dL (ref 65–99)
POTASSIUM: 4.5 mmol/L (ref 3.5–5.3)
Sodium: 139 mmol/L (ref 135–146)

## 2017-04-06 LAB — URINALYSIS, ROUTINE W REFLEX MICROSCOPIC
BILIRUBIN URINE: NEGATIVE
Glucose, UA: NEGATIVE
Hgb urine dipstick: NEGATIVE
Leukocytes, UA: NEGATIVE
NITRITE: NEGATIVE
PROTEIN: NEGATIVE
SPECIFIC GRAVITY, URINE: 1.026 (ref 1.001–1.03)
pH: 5.5 (ref 5.0–8.0)

## 2017-04-06 LAB — VITAMIN D 25 HYDROXY (VIT D DEFICIENCY, FRACTURES): VIT D 25 HYDROXY: 39 ng/mL (ref 30–100)

## 2017-04-06 LAB — HEPATIC FUNCTION PANEL
AG Ratio: 1.5 (calc) (ref 1.0–2.5)
ALBUMIN MSPROF: 3.9 g/dL (ref 3.6–5.1)
ALT: 13 U/L (ref 9–46)
AST: 15 U/L (ref 10–35)
Alkaline phosphatase (APISO): 44 U/L (ref 40–115)
BILIRUBIN DIRECT: 0.1 mg/dL (ref 0.0–0.2)
GLOBULIN: 2.6 g/dL (ref 1.9–3.7)
Indirect Bilirubin: 0.3 mg/dL (calc) (ref 0.2–1.2)
Total Bilirubin: 0.4 mg/dL (ref 0.2–1.2)
Total Protein: 6.5 g/dL (ref 6.1–8.1)

## 2017-04-06 LAB — LIPID PANEL
CHOLESTEROL: 133 mg/dL (ref <200)
HDL: 49 mg/dL (ref >40)
LDL CHOLESTEROL (CALC): 63 mg/dL
Non-HDL Cholesterol (Calc): 84 mg/dL (calc) (ref <130)
TRIGLYCERIDES: 120 mg/dL (ref <150)
Total CHOL/HDL Ratio: 2.7 (calc) (ref <5.0)

## 2017-04-06 LAB — MAGNESIUM: MAGNESIUM: 2 mg/dL (ref 1.5–2.5)

## 2017-04-06 LAB — HEMOGLOBIN A1C
HEMOGLOBIN A1C: 5 %{Hb} (ref <5.7)
Mean Plasma Glucose: 97 (calc)
eAG (mmol/L): 5.4 (calc)

## 2017-04-06 LAB — PSA: PSA: 0.9 ng/mL (ref <=4.0)

## 2017-04-06 LAB — TSH: TSH: 0.45 m[IU]/L (ref 0.40–4.50)

## 2017-04-06 LAB — VALPROIC ACID LEVEL: Valproic Acid Lvl: 44.7 mg/L — ABNORMAL LOW (ref 50.0–100.0)

## 2017-04-06 LAB — INSULIN, RANDOM: INSULIN: 8.1 u[IU]/mL (ref 2.0–19.6)

## 2017-05-01 DIAGNOSIS — D485 Neoplasm of uncertain behavior of skin: Secondary | ICD-10-CM | POA: Diagnosis not present

## 2017-05-01 DIAGNOSIS — Z23 Encounter for immunization: Secondary | ICD-10-CM | POA: Diagnosis not present

## 2017-05-01 DIAGNOSIS — L814 Other melanin hyperpigmentation: Secondary | ICD-10-CM | POA: Diagnosis not present

## 2017-05-01 DIAGNOSIS — L570 Actinic keratosis: Secondary | ICD-10-CM | POA: Diagnosis not present

## 2017-05-25 ENCOUNTER — Encounter: Payer: Self-pay | Admitting: Internal Medicine

## 2017-07-05 DIAGNOSIS — Z23 Encounter for immunization: Secondary | ICD-10-CM | POA: Diagnosis not present

## 2017-07-05 DIAGNOSIS — Z85828 Personal history of other malignant neoplasm of skin: Secondary | ICD-10-CM | POA: Diagnosis not present

## 2017-07-05 DIAGNOSIS — Z87898 Personal history of other specified conditions: Secondary | ICD-10-CM | POA: Diagnosis not present

## 2017-07-05 DIAGNOSIS — D225 Melanocytic nevi of trunk: Secondary | ICD-10-CM | POA: Diagnosis not present

## 2017-07-10 NOTE — Progress Notes (Signed)
Assessment and Plan:  Timothy [name] was seen today for referral.  Diagnoses and all orders for this visit:   Snoring/ ? Sleep apnea Has failed a mouth guard and nasal agent; requests to proceed with referral to discuss sleep study. Partner reports frequent apneic episodes at night.  -     Ambulatory referral to Neurology  Further disposition pending results of labs. Discussed med's effects and SE's.   Over 15 minutes of exam, counseling, chart review, and critical decision making was performed.   Future Appointments  Date Time Provider [place]  10/26/2017  4:00 PM Liane Comber, NP [facility] None  05/21/2018  3:00 PM [name], MD [facility] None    ------------------------------------------------------------------------------------------------------------------   HPI BP 140/86   Pulse (!) 58   Temp 97.7 F (36.5 C)   Ht 5\' 9"  (1.753 m)   Wt 185 lb (83.9 kg)   SpO2 97%   BMI 27.32 kg/m   67 y.o.male presents for concerns of snoring at night with reported frequent periods of apnea by significant other, requesting referral for sleep study. He denies headaches, daytime fatigue, palpitations, difficulty maintaining weight, anxiety. He has tried several oral and nasal agents for snoring in the past with moderate improvement but cannot seem to maintain through the night. Discussed referral to neurology to discuss sleep study options.   No other concerns today. No significant cardiovascular history.   Past Medical History:  Diagnosis Date  . Arthritis   . Bipolar disorder ([place])   . Wears glasses    reading     Allergies  Allergen Reactions  . Ciprofloxacin     Current Outpatient Medications on File Prior to Visit  Medication Sig  . ASPIRIN 81 PO Take by mouth.  . bisoprolol (ZEBETA) 5 MG tablet Take 1 tablet daily for BP  . Cholecalciferol (VITAMIN D3 PO) Take 15,000 Units by mouth.  . divalproex (DEPAKOTE) 250 MG DR tablet Take 500 mg by mouth 2 (two)  times daily.  Marland Kitchen loperamide (IMODIUM A-D) 2 MG tablet Take 2 mg by mouth 4 (four) times daily as needed for diarrhea or loose stools.   No current facility-administered medications on file prior to visit.     ROS: all negative except above.   Physical Exam:  BP 140/86   Pulse (!) 58   Temp 97.7 F (36.5 C)   Ht 5\' 9"  (1.753 m)   Wt 185 lb (83.9 kg)   SpO2 97%   BMI 27.32 kg/m   General Appearance: Well nourished, in no apparent distress. Eyes: PERRLA, EOMs, conjunctiva no swelling or erythema ENT/Mouth:  No erythema, swelling, or exudate on post pharynx.  Tonsils not swollen or erythematous. Hearing normal.  Neck: Supple, thyroid normal. No palpable or visible structural abnormalities.  Respiratory: Respiratory effort normal, BS equal bilaterally without rales, rhonchi, wheezing or stridor.  Cardio: RRR with no MRGs. Brisk peripheral pulses without edema.  Musculoskeletal: normal gait.  Neuro: Cranial nerves intact. Normal muscle tone, no cerebellar symptoms. Sensation intact.  Psych: Awake and oriented X 3, normal affect, Insight and Judgment appropriate.     Izora Ribas, NP 3:28 PM [facility]

## 2017-07-11 ENCOUNTER — Ambulatory Visit (INDEPENDENT_AMBULATORY_CARE_PROVIDER_SITE_OTHER): Payer: Medicare Other | Admitting: Adult Health

## 2017-07-11 ENCOUNTER — Encounter: Payer: Self-pay | Admitting: Adult Health

## 2017-07-11 VITALS — BP 140/86 | HR 58 | Temp 97.7°F | Ht 69.0 in | Wt 185.0 lb

## 2017-07-11 DIAGNOSIS — R0683 Snoring: Secondary | ICD-10-CM | POA: Diagnosis not present

## 2017-07-11 DIAGNOSIS — G4730 Sleep apnea, unspecified: Secondary | ICD-10-CM

## 2017-07-11 NOTE — Patient Instructions (Signed)
I think it is possible that you have sleep apnea. It can cause interrupted sleep, headaches, frequent awakenings, fatigue, dry mouth, fast/slow heart beats, memory issues, anxiety/depression, swelling, numbness tingling hands/feet, weight gain, shortness of breath, and the list goes on. Sleep apnea needs to be ruled out because if it is left untreated it does eventually lead to abnormal heart beats, lung failure or heart failure as well as increasing the risk of heart attack and stroke. There are masks you can wear OR a mouth piece that I can give you information about. Often times though people feel MUCH better after getting treatment.   Sleep Apnea  Sleep apnea is a sleep disorder characterized by abnormal pauses in breathing while you sleep. When your breathing pauses, the level of oxygen in your blood decreases. This causes you to move out of deep sleep and into light sleep. As a result, your quality of sleep is poor, and the system that carries your blood throughout your body (cardiovascular system) experiences stress. If sleep apnea remains untreated, the following conditions can develop:  High blood pressure (hypertension).  Coronary artery disease.  Inability to achieve or maintain an erection (impotence).  Impairment of your thought process (cognitive dysfunction). There are three types of sleep apnea: 1. Obstructive sleep apnea--Pauses in breathing during sleep because of a blocked airway. 2. Central sleep apnea--Pauses in breathing during sleep because the area of the brain that controls your breathing does not send the correct signals to the muscles that control breathing. 3. Mixed sleep apnea--A combination of both obstructive and central sleep apnea.  RISK FACTORS The following risk factors can increase your risk of developing sleep apnea:  Being overweight.  Smoking.  Having narrow passages in your nose and throat.  Being of older age.  Being male.  Alcohol use.   Sedative and tranquilizer use.  Ethnicity. Among individuals younger than 35 years, African Americans are at increased risk of sleep apnea. SYMPTOMS   Difficulty staying asleep.  Daytime sleepiness and fatigue.  Loss of energy.  Irritability.  Loud, heavy snoring.  Morning headaches.  Trouble concentrating.  Forgetfulness.  Decreased interest in sex. DIAGNOSIS  In order to diagnose sleep apnea, your caregiver will perform a physical examination. Your caregiver may suggest that you take a home sleep test. Your caregiver may also recommend that you spend the night in a sleep lab. In the sleep lab, several monitors record information about your heart, lungs, and brain while you sleep. Your leg and arm movements and blood oxygen level are also recorded. TREATMENT The following actions may help to resolve mild sleep apnea:  Sleeping on your side.   Using a decongestant if you have nasal congestion.   Avoiding the use of depressants, including alcohol, sedatives, and narcotics.   Losing weight and modifying your diet if you are overweight. There also are devices and treatments to help open your airway:  Oral appliances. These are custom-made mouthpieces that shift your lower jaw forward and slightly open your bite. This opens your airway.  Devices that create positive airway pressure. This positive pressure "splints" your airway open to help you breathe better during sleep. The following devices create positive airway pressure:  Continuous positive airway pressure (CPAP) device. The CPAP device creates a continuous level of air pressure with an air pump. The air is delivered to your airway through a mask while you sleep. This continuous pressure keeps your airway open.  Nasal expiratory positive airway pressure (EPAP) device. The EPAP device   creates positive air pressure as you exhale. The device consists of single-use valves, which are inserted into each nostril and held in  place by adhesive. The valves create very little resistance when you inhale but create much more resistance when you exhale. That increased resistance creates the positive airway pressure. This positive pressure while you exhale keeps your airway open, making it easier to breath when you inhale again.  Bilevel positive airway pressure (BPAP) device. The BPAP device is used mainly in patients with central sleep apnea. This device is similar to the CPAP device because it also uses an air pump to deliver continuous air pressure through a mask. However, with the BPAP machine, the pressure is set at two different levels. The pressure when you exhale is lower than the pressure when you inhale.  Surgery. Typically, surgery is only done if you cannot comply with less invasive treatments or if the less invasive treatments do not improve your condition. Surgery involves removing excess tissue in your airway to create a wider passage way. Document Released: 05/06/2002 Document Revised: 09/10/2012 Document Reviewed: 09/22/2011 [employer] Patient Information 2015 [employer]. This information is not intended to replace advice given to you by your health care provider. Make sure you discuss any questions you have with your health care provider.     

## 2017-07-20 DIAGNOSIS — D485 Neoplasm of uncertain behavior of skin: Secondary | ICD-10-CM | POA: Diagnosis not present

## 2017-08-28 ENCOUNTER — Encounter: Payer: Self-pay | Admitting: Neurology

## 2017-08-28 ENCOUNTER — Ambulatory Visit (INDEPENDENT_AMBULATORY_CARE_PROVIDER_SITE_OTHER): Payer: Medicare Other | Admitting: Neurology

## 2017-08-28 VITALS — BP 130/84 | HR 61 | Ht 69.0 in | Wt 182.0 lb

## 2017-08-28 DIAGNOSIS — I10 Essential (primary) hypertension: Secondary | ICD-10-CM

## 2017-08-28 DIAGNOSIS — R0683 Snoring: Secondary | ICD-10-CM | POA: Diagnosis not present

## 2017-08-28 NOTE — Progress Notes (Signed)
SLEEP MEDICINE CLINIC   Provider:  Larey Seat, M D  Primary Care Physician:  [name], MD   Referring Provider: [name], MD   Chief Complaint  Patient presents with  . New Patient (Initial Visit)    pt alone, rm 10. pt has been told by girlfriend and was told that he snores, stops breathing about 3-5 sec when he stops. pt gets about 6 hours of uninterupted sleep.     HPI:  Timothy [name] is a 67 y.o. male , seen here  in a referral from Dr. Melford Aase for a sleep apnea evaluation.   Timothy [name] is a younger than his numeric age appearing 53 male patient referred by Dr. Titus Mould office for a sleep evaluation.  The patient reports that his girlfriend had made him aware of his snoring, she also has noted pauses in his breathing but this seems only to last for 3-5 seconds rather briefly.  He himself feels that his sleep is normal, sound refreshing and restorative.  He usually gets 6 hours of uninterrupted sleep each night.  He is on a variety of medication, bisoprolol, for blood pressure because of vitamin D supplement, Depakote 2 times daily, and he takes as needed Imodium for loose stools.   Chief complaint according to patient :  Sleep habits are as follows: The patient reports that he will fall asleep immediately if he would be reading in the evening.  For the last hour before he goes to bed he usually watches TV. The patient's bedtime is around 10 PM, his bedroom is cool, quiet and dark.  He prefers to sleep on his side but due to a shoulder injury on the left side prefers now his right.  He also may sleep prone or supine.  He uses one pillow for head support only.  He may have one bathroom break around 2 AM but not every night. There is no difficulty with initiating sleep but sometimes was waking up too early. He may have nocturia times one at 2 AM. He will wakes spontaneously between 4 and 4:30 AM and likes to rise early to be early in his office. On weekends he  goes and wakes an hour later.    Sleep medical history and family sleep history:  Adoptee, no sleep walking, no night terrors.    Social history:  No tobacco use, drinks red wine,  1-2 glasses, on weekends up to 3 y.  Caffeine - one cup a day, in AM, no iced tea nor sodas.    Review of Systems: Out of a complete 14 system review, the patient complains of only the following symptoms, and all other reviewed systems are negative.  snoring  Epworth score  5 , Fatigue severity score 12 , depression score low. 1/ 15   Social History   Socioeconomic History  . Marital status: Legally Separated    Spouse name: Not on file  . Number of children: Not on file  . Years of education: Not on file  . Highest education level: Not on file  Occupational History  . Not on file  Social Needs  . Financial resource strain: Not on file  . Food insecurity:    Worry: Not on file    Inability: Not on file  . Transportation needs:    Medical: Not on file    Non-medical: Not on file  Tobacco Use  . Smoking status: Never Smoker  . Smokeless tobacco: Never Used  Substance and Sexual Activity  .  Alcohol use: Yes    Comment: 5 days a week  . Drug use: No  . Sexual activity: Not on file  Lifestyle  . Physical activity:    Days per week: Not on file    Minutes per session: Not on file  . Stress: Not on file  Relationships  . Social connections:    Talks on phone: Not on file    Gets together: Not on file    Attends religious service: Not on file    Active member of club or organization: Not on file    Attends meetings of clubs or organizations: Not on file    Relationship status: Not on file  . Intimate partner violence:    Fear of current or ex partner: Not on file    Emotionally abused: Not on file    Physically abused: Not on file    Forced sexual activity: Not on file  Other Topics Concern  . Not on file  Social History Narrative  . Not on file    No family history on file.  Past  Medical History:  Diagnosis Date  . Arthritis   . Bipolar disorder ([place])   . Wears glasses    reading    Past Surgical History:  Procedure Laterality Date  . APPENDECTOMY    . COLONOSCOPY    . KNEE ARTHROSCOPY W/ MENISCAL REPAIR  2004   left  . ORIF HUMERUS FRACTURE Left 09/19/2013   Procedure: LEFT OPEN REDUCTION INTERNAL FIXATION (ORIF) PROXIMAL HUMERUS; ROTATOR CUFF REPAIR;  Surgeon: [name], MD;  Location: [facility];  Service: Orthopedics;  Laterality: Left;  . TONSILLECTOMY      Current Outpatient Medications  Medication Sig Dispense Refill  . ASPIRIN 81 PO Take by mouth.    . bisoprolol (ZEBETA) 5 MG tablet Take 1 tablet daily for BP 90 tablet 1  . Cholecalciferol (VITAMIN D3 PO) Take 15,000 Units by mouth.    . divalproex (DEPAKOTE) 250 MG DR tablet Take 500 mg by mouth 2 (two) times daily.  4  . loperamide (IMODIUM A-D) 2 MG tablet Take 2 mg by mouth 4 (four) times daily as needed for diarrhea or loose stools.     No current facility-administered medications for this visit.     Allergies as of 08/28/2017 - Review Complete 08/28/2017  Allergen Reaction Noted  . Ciprofloxacin  07/11/2017    Vitals: BP 130/84   Pulse 61   Ht 5\' 9"  (1.753 m)   Wt 182 lb (82.6 kg)   BMI 26.88 kg/m  Last Weight:  Wt Readings from Last 1 Encounters:  08/28/17 182 lb (82.6 kg)   GNF:AOZH mass index is 26.88 kg/m.     Last Height:   Ht Readings from Last 1 Encounters:  08/28/17 5\' 9"  (1.753 m)    Physical exam:  General: The patient is awake, alert and appears not in acute distress. The patient is well groomed. Head: Normocephalic, atraumatic. Neck is supple. Mallampati 3,  With a sharp angle.  neck circumference: 15  . Nasal airflow  Patent , TMJ is not  evident . Retrognathia is not seen but a small jaw .  Cardiovascular:  Regular rate and rhythm , without  murmurs or carotid bruit, and without distended neck veins. Respiratory: Lungs are clear to  auscultation. Skin:  Without evidence of edema, or rash Trunk: BMI is 26.8. The patient's posture is erect.   Neurologic exam : The patient is awake and  alert, oriented to place and time.   Memory subjective  described as intact.  Cranial nerves: Pupils are equal and briskly reactive to light. Funduscopic exam without evidence of pallor or edema. Extraocular movements  in vertical and horizontal planes intact and without nystagmus. Visual fields by finger perimetry are intact. Hearing to finger rub intact.  Facial sensation intact to fine touch. Facial motor strength is symmetric and tongue and uvula move midline. Shoulder shrug was symmetrical.   Motor exam:  Normal tone, muscle bulk and symmetric strength in all extremities. Sensory:  Fine touch, pinprick and vibration were tested in all extremities. Proprioception tested in the upper extremities was normal. Coordination: Rapid alternating movements in the fingers/hands was normal. Finger-to-nose maneuver  normal without evidence of ataxia, dysmetria or tremor. Gait and station: Patient walks without assistive device and is able unassisted to climb up to the exam table. Strength within normal limits. Stance is stable and normal.  Deep tendon reflexes: in the  upper and lower extremities are symmetric and intact. Babinski maneuver response is downgoing.  Assessment:  After physical and neurologic examination, review of laboratory studies,  Personal review of imaging studies, reports of other /same  Imaging studies, results of polysomnography and / or neurophysiology testing and pre-existing records as far as provided in visit., my assessment is   1) Timothy [name] reports no excessive daytime sleepiness, usually sleeps for 6 hours which has been his habit for many years, and sometimes he may wake up with a dry mouth.  He has not woken himself from snoring, they have been no prolonged apneas witnessed, BMI airway anatomy and nasal patency are normal. He  may snore mostly when in supine position given that his airway is short and has an abrupt angle, but he does not have a narrowing of the airway.  The patient was advised of the nature of the diagnosed disorder , the treatment options and the  risks for general health and wellness arising from not treating the condition.   I spent more than 40 minutes of face to face time with the patient.  Greater than 50% of time was spent in counseling and coordination of care. We have discussed the diagnosis and differential and I answered the patient's questions.    Plan:  Treatment plan and additional workup : I would like to screen Timothy [name] for the presence of sleep apnea, but I do not have a high index of suspicion for its presence.  The results of the home sleep test will be called to him and to his referring physician.   Larey Seat, MD 08/04/9022, 0:97 PM  Certified in Neurology by ABPN Certified in [place] by [facility] [address] [place], [place] [address]

## 2017-09-01 ENCOUNTER — Other Ambulatory Visit: Payer: Self-pay | Admitting: Internal Medicine

## 2017-09-01 MED ORDER — NEOMYCIN-POLYMYXIN-DEXAMETH 3.5-10000-0.1 OP SUSP: OPHTHALMIC | 1 refills | Status: DC

## 2017-09-16 ENCOUNTER — Other Ambulatory Visit: Payer: Self-pay | Admitting: Internal Medicine

## 2017-09-16 DIAGNOSIS — I10 Essential (primary) hypertension: Secondary | ICD-10-CM

## 2017-09-18 DIAGNOSIS — M25552 Pain in left hip: Secondary | ICD-10-CM | POA: Diagnosis not present

## 2017-09-20 ENCOUNTER — Telehealth: Payer: Self-pay

## 2017-09-20 NOTE — Telephone Encounter (Signed)
We have attempted to call the patient two times to schedule sleep study.  Patient has been unavailable at the phone numbers we have on file and has not returned our calls.  At this point we will send a letter asking patient to please contact the sleep lab to schedule their sleep study.  If patient calls back we will schedule them for their sleep study. 

## 2017-09-21 ENCOUNTER — Encounter: Payer: Self-pay | Admitting: Internal Medicine

## 2017-10-24 DIAGNOSIS — M25552 Pain in left hip: Secondary | ICD-10-CM | POA: Diagnosis not present

## 2017-10-26 ENCOUNTER — Ambulatory Visit: Payer: Self-pay | Admitting: Adult Health

## 2017-10-31 DIAGNOSIS — M25559 Pain in unspecified hip: Secondary | ICD-10-CM | POA: Diagnosis not present

## 2017-11-01 ENCOUNTER — Ambulatory Visit (INDEPENDENT_AMBULATORY_CARE_PROVIDER_SITE_OTHER): Payer: Medicare Other | Admitting: Neurology

## 2017-11-01 DIAGNOSIS — I10 Essential (primary) hypertension: Secondary | ICD-10-CM

## 2017-11-01 DIAGNOSIS — G4733 Obstructive sleep apnea (adult) (pediatric): Secondary | ICD-10-CM | POA: Diagnosis not present

## 2017-11-01 DIAGNOSIS — R0683 Snoring: Secondary | ICD-10-CM

## 2017-11-05 NOTE — Progress Notes (Signed)
MEDICARE ANNUAL WELLNESS VISIT AND FOLLOW UP Assessment:   Diagnoses and all orders for this visit:  Encounter for Medicare annual wellness exam  Essential hypertension Continue medication Monitor blood pressure at home; call if consistently over 130/80 Continue DASH diet.   Reminder to go to the ER if any CP, SOB, nausea, dizziness, severe HA, changes vision/speech, left arm numbness and tingling and jaw pain.  Vitamin D deficiency Continue supplementation Check vitamin D level  Snoring Pending sleep study results with Dr. [name]  Other abnormal glucose Recent A1Cs at goal Discussed diet/exercise, weight management  Defer A1C; check BMP  Medication management CBC, CMP/GFR  Depression, unspecified depression type Continue follow up with Dr. Clovis Pu Check valproic acid levels  BMI 26.0-26.9,adult Continue to recommend diet heavy in fruits and veggies and low in animal meats, cheeses, and dairy products, appropriate calorie intake Discuss exercise recommendations routinely Continue to monitor weight at each visit    Over 30 minutes of exam, counseling, chart review, and critical decision making was performed  Future Appointments  Date Time Provider [place]  05/21/2018  3:00 PM [name], MD [facility] None     Plan:   During the course of the visit the patient was educated and counseled about appropriate screening and preventive services including:    Pneumococcal vaccine   Influenza vaccine  Prevnar 13  Td vaccine  Screening electrocardiogram  Colorectal cancer screening  Diabetes screening  Glaucoma screening  Nutrition counseling    Subjective:  Timothy [name] is a 67 y.o. male who presents for Medicare Annual Wellness Visit and 6 month follow up for HTN, hyperlipidemia, glucose management, and vitamin D Def. Patient  followed by Dr Clovis Pu for Depression & is on Depakote. Patient had a colonoscopy (May 2017) with a benign polyp  per Dr Amedeo Plenty. He is pending sleep study by Dr. [name] after his partner reported frequent apneic episodes at night; he has failed oral and nasal devices.  He is following with [facility] for new L hip pain with stair climbing, pending MRI and discussing injections.   BMI is Body mass index is 26.52 kg/m., he has been working on diet and exercise. Wt Readings from Last 3 Encounters:  11/06/17 179 lb 9.6 oz (81.5 kg)  08/28/17 182 lb (82.6 kg)  07/11/17 185 lb (83.9 kg)   His blood pressure has been controlled at home, today their BP is BP: 118/74 He does workout. He denies chest pain, shortness of breath, dizziness.   He is not on cholesterol medication and denies myalgias. His cholesterol is at goal. The cholesterol last visit was:   Lab Results  Component Value Date   CHOL 133 04/05/2017   HDL 49 04/05/2017   LDLCALC 63 04/05/2017   TRIG 120 04/05/2017   CHOLHDL 2.7 04/05/2017   He has been working on diet and exercise for glucose management, and denies foot ulcerations, increased appetite, nausea, paresthesia of the feet, polydipsia, polyuria, visual disturbances, vomiting and weight loss. Last A1C in the office was:  Lab Results  Component Value Date   HGBA1C 5.0 04/05/2017   Last GFR Lab Results  Component Value Date   GFRNONAA 91 04/05/2017    Patient is on Vitamin D supplement.   Lab Results  Component Value Date   VD25OH 39 04/05/2017      Medication Review:   Current Outpatient Medications (Cardiovascular):  .  bisoprolol-hydrochlorothiazide (ZIAC) 5-6.25 MG tablet, TAKE 1 TABLET BY MOUTH EVERY DAY FOR BLOOD PRESSURE. Marland Kitchen  bisoprolol (ZEBETA) 5 MG tablet, Take 1 tablet daily for BP   Current Outpatient Medications (Analgesics):  Marland Kitchen  ASPIRIN 81 PO, Take by mouth.   Current Outpatient Medications (Other):  Marland Kitchen  Cholecalciferol (VITAMIN D3 PO), Take 15,000 Units by mouth. .  divalproex (DEPAKOTE) 250 MG DR tablet, Take 500 mg by mouth 2 (two) times  daily. Marland Kitchen  loperamide (IMODIUM A-D) 2 MG tablet, Take 2 mg by mouth 4 (four) times daily as needed for diarrhea or loose stools.  Allergies: Allergies  Allergen Reactions  . Ciprofloxacin     Current Problems (verified) has HTN (hypertension); Vitamin D deficiency; Other abnormal glucose; Diarrhea; Medication management; Depression; and Snoring on their problem list.  Screening Tests Immunization History  Administered Date(s) Administered  . Pneumococcal Conjugate-13 04/05/2017   Preventative care: Last colonoscopy: May/2017 - benign polyp - Dr. Amedeo Plenty -report requested  Prior vaccinations: TD or Tdap: Declines  Influenza: Declines  Pneumococcal: Due 1 year from [place]: 2018 Shingles/Zostavax: had at previous practice  Names of Other Physician/Practitioners you currently use: 1. [facility] here for primary care 2. Does not see somebody regularly, eye doctor, last visit 7+ years ago 3. Dr. Orvil Feil, dentist, last visit 2017  Patient Care Team: [name], MD as PCP - General (Internal Medicine) Cottle, Billey Co., MD as Attending Physician (Psychiatry)  Surgical: He  has a past surgical history that includes Tonsillectomy; Appendectomy; Colonoscopy; Knee arthroscopy w/ meniscal repair (2004); and ORIF humerus fracture (Left, 09/19/2013). Family His family history is not on file. Social history  He reports that he has never smoked. He has never used smokeless tobacco. He reports that he drinks alcohol. He reports that he does not use drugs.  MEDICARE WELLNESS OBJECTIVES: Physical activity: Current Exercise Habits: Home exercise routine, Type of exercise: walking, Time (Minutes): 60, Frequency (Times/Week): 3, Weekly Exercise (Minutes/Week): 180, Intensity: Mild, Exercise limited by: None identified Cardiac risk factors: Cardiac Risk Factors include: advanced age (>71men, >68 women);male gender;hypertension Depression/mood  screen:   Depression screen [facility] 2/9 11/06/2017  Decreased Interest 0  Down, Depressed, Hopeless 0  PHQ - 2 Score 0    ADLs:  In your present state of health, do you have any difficulty performing the following activities: 11/06/2017  Hearing? N  Vision? N  Difficulty concentrating or making decisions? N  Walking or climbing stairs? Y  Comment Some pain with climbing stairs, Can do, somewhat limited by acute left hip pain, seeing ortho  Dressing or bathing? N  Doing errands, shopping? N  Some recent data might be hidden     Cognitive Testing  Alert? Yes  Normal Appearance?Yes  Oriented to person? Yes  Place? Yes   Time? Yes  Recall of three objects?  Yes  Can perform simple calculations? Yes  Displays appropriate judgment?Yes  Can read the correct time from a watch face?Yes  EOL planning: Does Patient Have a Medical Advance Directive?: Yes Type of Advance Directive: Healthcare Power of Attorney, Living will Does patient want to make changes to medical advance directive?: No - Patient declined Copy of [place] in Chart?: No - copy requested   Objective:   Today's Vitals   11/06/17 0938  BP: 118/74  Pulse: 60  Resp: 16  Temp: (!) 97.3 F (36.3 C)  SpO2: 97%  Weight: 179 lb 9.6 oz (81.5 kg)  Height: 5\' 9"  (1.753 m)  PainSc: 2   PainLoc: Hip   Body mass index is  26.52 kg/m.  General appearance: alert, no distress, WD/WN, male HEENT: normocephalic, sclerae anicteric, TMs pearly, nares patent, no discharge or erythema, pharynx normal Oral cavity: MMM, no lesions Neck: supple, no lymphadenopathy, no thyromegaly, no masses Heart: RRR, normal S1, S2, no murmurs Lungs: CTA bilaterally, no wheezes, rhonchi, or rales Abdomen: +bs, soft, non tender, non distended, no masses, no hepatomegaly, no splenomegaly Musculoskeletal: nontender, no swelling, no obvious deformity Extremities: no edema, no cyanosis, no clubbing Pulses: 2+ symmetric, upper and lower  extremities, normal cap refill Neurological: alert, oriented x 3, CN2-12 intact, strength normal upper extremities and lower extremities, sensation normal throughout, DTRs 2+ throughout, no cerebellar signs, gait normal Psychiatric: normal affect, behavior normal, pleasant   Medicare Attestation I have personally reviewed: The patient's medical and social history Their use of alcohol, tobacco or illicit drugs Their current medications and supplements The patient's functional ability including ADLs,fall risks, home safety risks, cognitive, and hearing and visual impairment Diet and physical activities Evidence for depression or mood disorders  The patient's weight, height, BMI, and visual acuity have been recorded in the chart.  I have made referrals, counseling, and provided education to the patient based on review of the above and I have provided the patient with a written personalized care plan for preventive services.     Izora Ribas, NP   11/06/2017

## 2017-11-06 ENCOUNTER — Ambulatory Visit (INDEPENDENT_AMBULATORY_CARE_PROVIDER_SITE_OTHER): Payer: Medicare Other | Admitting: Adult Health

## 2017-11-06 ENCOUNTER — Encounter: Payer: Self-pay | Admitting: Adult Health

## 2017-11-06 VITALS — BP 118/74 | HR 60 | Temp 97.3°F | Resp 16 | Ht 69.0 in | Wt 179.6 lb

## 2017-11-06 DIAGNOSIS — Z79899 Other long term (current) drug therapy: Secondary | ICD-10-CM

## 2017-11-06 DIAGNOSIS — E559 Vitamin D deficiency, unspecified: Secondary | ICD-10-CM

## 2017-11-06 DIAGNOSIS — F329 Major depressive disorder, single episode, unspecified: Secondary | ICD-10-CM | POA: Diagnosis not present

## 2017-11-06 DIAGNOSIS — R6889 Other general symptoms and signs: Secondary | ICD-10-CM | POA: Diagnosis not present

## 2017-11-06 DIAGNOSIS — F32A Depression, unspecified: Secondary | ICD-10-CM

## 2017-11-06 DIAGNOSIS — R0683 Snoring: Secondary | ICD-10-CM | POA: Diagnosis not present

## 2017-11-06 DIAGNOSIS — Z0100 Encounter for examination of eyes and vision without abnormal findings: Secondary | ICD-10-CM

## 2017-11-06 DIAGNOSIS — Z6826 Body mass index (BMI) 26.0-26.9, adult: Secondary | ICD-10-CM | POA: Diagnosis not present

## 2017-11-06 DIAGNOSIS — I10 Essential (primary) hypertension: Secondary | ICD-10-CM

## 2017-11-06 DIAGNOSIS — Z0000 Encounter for general adult medical examination without abnormal findings: Secondary | ICD-10-CM

## 2017-11-06 DIAGNOSIS — Z23 Encounter for immunization: Secondary | ICD-10-CM | POA: Diagnosis not present

## 2017-11-06 DIAGNOSIS — R7309 Other abnormal glucose: Secondary | ICD-10-CM

## 2017-11-06 DIAGNOSIS — Z0001 Encounter for general adult medical examination with abnormal findings: Secondary | ICD-10-CM | POA: Diagnosis not present

## 2017-11-06 NOTE — Patient Instructions (Signed)
Aim for 7+ servings of fruits and vegetables daily  80+ fluid ounces of water or unsweet tea for healthy kidneys  Limit alcohol intake, avoid smoking  Limit animal fats in diet for cholesterol and heart health - choose grass fed whenever available  Aim for low stress - take time to unwind and care for your mental health  Aim for 150 min of moderate intensity exercise weekly for heart health, and weights twice weekly for bone health  Aim for 7-9 hours of sleep daily      When it comes to diets, agreement about the perfect plan isn't easy to find, even among the experts. Experts at the [employer] developed an idea known as the Healthy Eating Plate. Just imagine a plate divided into logical, healthy portions.  The emphasis is on diet quality:  Load up on vegetables and fruits - one-half of your plate: Aim for color and variety, and remember that potatoes don't count.  Go for whole grains - one-quarter of your plate: Whole wheat, barley, wheat berries, quinoa, oats, brown rice, and foods made with them. If you want pasta, go with whole wheat pasta.  Protein power - one-quarter of your plate: Fish, chicken, beans, and nuts are all healthy, versatile protein sources. Limit red meat.  The diet, however, does go beyond the plate, offering a few other suggestions.  Use healthy plant oils, such as olive, canola, soy, corn, sunflower and peanut. Check the labels, and avoid partially hydrogenated oil, which have unhealthy trans fats.  If you're thirsty, drink water. Coffee and tea are good in moderation, but skip sugary drinks and limit milk and dairy products to one or two daily servings.  The type of carbohydrate in the diet is more important than the amount. Some sources of carbohydrates, such as vegetables, fruits, whole grains, and beans-are healthier than others.  Finally, stay active.   

## 2017-11-07 LAB — COMPLETE METABOLIC PANEL WITH GFR
AG Ratio: 1.5 (calc) (ref 1.0–2.5)
ALBUMIN MSPROF: 4.4 g/dL (ref 3.6–5.1)
ALT: 12 U/L (ref 9–46)
AST: 13 U/L (ref 10–35)
Alkaline phosphatase (APISO): 57 U/L (ref 40–115)
BILIRUBIN TOTAL: 0.6 mg/dL (ref 0.2–1.2)
BUN: 17 mg/dL (ref 7–25)
CHLORIDE: 96 mmol/L — AB (ref 98–110)
CO2: 33 mmol/L — AB (ref 20–32)
CREATININE: 0.86 mg/dL (ref 0.70–1.25)
Calcium: 9.6 mg/dL (ref 8.6–10.3)
GFR, Est African American: 104 mL/min/{1.73_m2} (ref >=60)
GFR, Est Non African American: 90 mL/min/{1.73_m2} (ref >=60)
GLUCOSE: 119 mg/dL — AB (ref 65–99)
Globulin: 2.9 g/dL (calc) (ref 1.9–3.7)
Potassium: 4.2 mmol/L (ref 3.5–5.3)
Sodium: 135 mmol/L (ref 135–146)
TOTAL PROTEIN: 7.3 g/dL (ref 6.1–8.1)

## 2017-11-07 LAB — CBC WITH DIFFERENTIAL/PLATELET
BASOS PCT: 0.2 %
Basophils Absolute: 16 cells/uL (ref 0–200)
EOS ABS: 49 {cells}/uL (ref 15–500)
EOS PCT: 0.6 %
HCT: 45.9 % (ref 38.5–50.0)
Hemoglobin: 15.7 g/dL (ref 13.2–17.1)
Lymphs Abs: 2952 cells/uL (ref 850–3900)
MCH: 30.5 pg (ref 27.0–33.0)
MCHC: 34.2 g/dL (ref 32.0–36.0)
MCV: 89.1 fL (ref 80.0–100.0)
MONOS PCT: 8.3 %
MPV: 11.7 fL (ref 7.5–12.5)
NEUTROS ABS: 4502 {cells}/uL (ref 1500–7800)
Neutrophils Relative %: 54.9 %
PLATELETS: 231 10*3/uL (ref 140–400)
RBC: 5.15 10*6/uL (ref 4.20–5.80)
RDW: 12.5 % (ref 11.0–15.0)
Total Lymphocyte: 36 %
WBC mixed population: 681 cells/uL (ref 200–950)
WBC: 8.2 10*3/uL (ref 3.8–10.8)

## 2017-11-07 LAB — VITAMIN D 25 HYDROXY (VIT D DEFICIENCY, FRACTURES): VIT D 25 HYDROXY: 98 ng/mL (ref 30–100)

## 2017-11-07 LAB — VALPROIC ACID LEVEL: Valproic Acid Lvl: 74 mg/L (ref 50.0–100.0)

## 2017-11-07 NOTE — Procedures (Signed)
[facility] [address], [place] [address] NAME:  Timothy [name]                                                                DOB: 10-09-1950 MEDICAL RECORD NUMBER [id number]                                            DOS:  11/01/2017 REFERRING PHYSICIAN: [name], M.D. STUDY PERFORMED: Home Sleep Study on apnea link  HISTORY:  Timothy [name] is 67 year old Caucasian male patient referred for a sleep evaluation.  The patient reports that his girlfriend had made him aware of his snoring, she also has noted pauses in his breathing but these seem only to last for 3-5 seconds.  He himself feels that his sleep is normal, sound, refreshing, and restorative.  He usually gets 6 hours of uninterrupted sleep each night.    Epworth Sleepiness score endorsed at 5 points, Fatigue severity score at 12 points, BMI is 26.8 kg/m2  STUDY RESULTS:  Total Recording Time: 11 hours 1 min., valid test time is 7 h and 5 min.  Total Apnea/Hypopnea Index (AHI):  20.8 /h; RDI:  23.3 /h Average Oxygen Saturation (SpO2):  92 %; Lowest Oxygen Saturation: 72 %  Total Time Oxygen with Saturation below 89 %:  31.0 minutes  Average Heart Rate:  61 bpm (between 50 and 120 bpm) IMPRESSION:   1) Moderate sleep apnea, 91% of events were Obstructive Apnea.  2) Snoring was moderately loud.  3) There was some hypoxemia noted, but only for 5% of valid test time.  4) Variability in pulse rate was in normal limits. RECOMMENDATION:  this constellation of Moderate Obstructive Sleep Apnea with loud snoring can be treated by CPAP, dental device and may be positional (reducable by avoiding supine sleep). It should not be left untreated. Treatment of OSA can improve hypertension, heart rate and daytime alertness. I certify that I have reviewed the raw data recording prior to the issuance of this report in accordance with the standards of the American Academy of Sleep Medicine (AASM).  Larey Seat, M.D.   11-07-2017      Medical Director of [facility], accredited by the AASM. Diplomat of the ABPN and ABSM. Fellow of the AASM and AAN.

## 2017-11-08 ENCOUNTER — Telehealth: Payer: Self-pay | Admitting: Neurology

## 2017-11-08 DIAGNOSIS — G4733 Obstructive sleep apnea (adult) (pediatric): Secondary | ICD-10-CM

## 2017-11-08 DIAGNOSIS — I10 Essential (primary) hypertension: Secondary | ICD-10-CM

## 2017-11-08 DIAGNOSIS — R0683 Snoring: Secondary | ICD-10-CM

## 2017-11-08 NOTE — Telephone Encounter (Signed)
-----   Message from Larey Seat, MD sent at 11/07/2017  6:12 PM EDT ----- IMPRESSION:   1. Moderate sleep apnea, 91% of events were Obstructive Sleep Apnea type.  2. Snoring was moderately loud.  3. There was some hypoxemia noted, but only for 5% of valid test time.  4. Variability in pulse rate was in normal limits.  RECOMMENDATION:  this constellation of Moderate Obstructive Sleep Apnea with loud snoring can be treated by CPAP, dental device and may be positional (reducable by avoiding supine sleep).  It should not be left untreated.  Treatment of OSA can improve hypertension, heart rate and daytime alertness. I would prefer treatment with a CPAP- but leave the decision of CPAP versus dental device to the patient.  Myriam Jacobson, please inform us of patient's choice of treatment.   I certify that I have reviewed the raw data recording prior to the issuance of this report in accordance with the standards of the American Academy of Sleep Medicine (AASM).

## 2017-11-08 NOTE — Telephone Encounter (Signed)
Called patient to discuss sleep study results. No answer at this time. LVM for the patient to call back.   

## 2017-11-09 ENCOUNTER — Encounter: Payer: Self-pay | Admitting: Adult Health

## 2017-11-09 DIAGNOSIS — Z86010 Personal history of colonic polyps: Secondary | ICD-10-CM | POA: Insufficient documentation

## 2017-11-09 DIAGNOSIS — D126 Benign neoplasm of colon, unspecified: Secondary | ICD-10-CM | POA: Insufficient documentation

## 2017-11-10 DIAGNOSIS — F322 Major depressive disorder, single episode, severe without psychotic features: Secondary | ICD-10-CM | POA: Diagnosis not present

## 2017-11-13 NOTE — Telephone Encounter (Signed)
Called patient to discuss sleep study results for 2nd time. No answer at this time. LVM for the patient to call back.

## 2017-11-14 DIAGNOSIS — M1612 Unilateral primary osteoarthritis, left hip: Secondary | ICD-10-CM | POA: Diagnosis not present

## 2017-11-16 NOTE — Telephone Encounter (Signed)
Called the pt for the 3rd time to discuss sleep study results. No answer. LVM informing we will send a formal letter for him to call us.

## 2017-11-27 NOTE — Telephone Encounter (Signed)
Pt returned call. I reviewed the sleep study results with the pt and reviewed the treatment options with the patient. I asked the patient which he would like to try at this time he didn't state. Informed the patient just to give [place] a call and inform us of which treatment option he would like to pursue. Pt verbalized understanding. Pt had no questions at this time but was encouraged to call back if questions arise.

## 2017-11-27 NOTE — Addendum Note (Signed)
Addended by: Darleen Crocker on: 11/27/2017 02:29 PM   Modules accepted: Orders

## 2017-11-27 NOTE — Telephone Encounter (Signed)
I called the patient back and verified he was ok to start CPAP. I reviewed PAP compliance expectations with the pt. Pt is agreeable to starting a CPAP. I advised pt that an order will be sent to a DME, [facility], and [facility] will call the pt within about one week after they file with the pt's insurance. [facility] will show the pt how to use the machine, fit for masks, and troubleshoot the CPAP if needed. A follow up appt was made for insurance purposes with Dr. [name] on Oct 1,2019 at 8:30 am. Pt verbalized understanding to arrive 15 minutes early and bring their CPAP. A letter with all of this information in it will be mailed to the pt as a reminder. I verified with the pt that the address we have on file is correct. Pt verbalized understanding of results. Pt had no questions at this time but was encouraged to call back if questions arise.

## 2017-11-27 NOTE — Telephone Encounter (Signed)
Pt called stating he would like to try a Cpap machine. Please call to advise on how to more forwards

## 2017-12-20 DIAGNOSIS — H43813 Vitreous degeneration, bilateral: Secondary | ICD-10-CM | POA: Diagnosis not present

## 2017-12-22 DIAGNOSIS — F411 Generalized anxiety disorder: Secondary | ICD-10-CM | POA: Diagnosis not present

## 2018-01-11 DIAGNOSIS — F411 Generalized anxiety disorder: Secondary | ICD-10-CM | POA: Diagnosis not present

## 2018-02-20 ENCOUNTER — Telehealth: Payer: Self-pay | Admitting: Neurology

## 2018-02-20 NOTE — Telephone Encounter (Signed)
error 

## 2018-02-20 NOTE — Telephone Encounter (Signed)
Called the patient and left a message as there was no answer. Pt called the sleep lab and left a message stating he needed to cancel his apt for 02/27/2018 at 8:30 am. I have called the patient back and advised him that the patient was set up with cpap on 12/07/2017. Per insurance guidelines patient has to be seen within 90 days which would be 03/09/2018. I have asked that the patient call us back and we can get him rescheduled but again needs to be before 03/09/18.   If patient returns call please offer any openings with MD or NP between now and oct 11,2019

## 2018-02-22 NOTE — Telephone Encounter (Signed)
Called the patient back to offer the several options that I was able to find for him. At this time the patient states that he was unable to do any of those. Pt states he was just told that he had to be seen 31-90 days of getting the machine. I informed him that was not true when we scheduled the apt in June  Reviewed that with him as well as mailed a letter informing him of this medicare guideline. Patient states he could potentially move his previous apt on 10/10 at 10:00 am. At this time I am waiting to hear if this time will work for the NP since this is a scheduled research time for her. She has to make sure she doesn't have a pt in that slot before adding this patient to schedule. Informed the patient I would call back and leave a message with him on if this apt will or will not work. Pt verbalized understanding.

## 2018-02-22 NOTE — Telephone Encounter (Signed)
Pt returning RNs call, looked through MD and NP(Megan and [place]) schedule nothing available within time frame. Offer work in [employer] for 10/3 at 8:30 with MD pt declined. Unsure if able to schedule with NP Janett Billow please advise

## 2018-02-22 NOTE — Telephone Encounter (Signed)
Called the patient and advised the NP can see him on that date 10/10 at 10:00 am. Advised the patient to check in at 9:30 am for check in for the apt. Pt verbalized understanding.

## 2018-02-27 ENCOUNTER — Ambulatory Visit: Payer: Self-pay | Admitting: Neurology

## 2018-03-07 ENCOUNTER — Encounter: Payer: Self-pay | Admitting: Adult Health

## 2018-03-08 ENCOUNTER — Encounter: Payer: Self-pay | Admitting: Adult Health

## 2018-03-08 ENCOUNTER — Ambulatory Visit (INDEPENDENT_AMBULATORY_CARE_PROVIDER_SITE_OTHER): Payer: Medicare Other | Admitting: Adult Health

## 2018-03-08 VITALS — BP 112/70 | HR 60 | Ht 69.0 in | Wt 184.0 lb

## 2018-03-08 DIAGNOSIS — G4733 Obstructive sleep apnea (adult) (pediatric): Secondary | ICD-10-CM

## 2018-03-08 DIAGNOSIS — Z9989 Dependence on other enabling machines and devices: Secondary | ICD-10-CM

## 2018-03-08 NOTE — Progress Notes (Signed)
PATIENT: Leodan Bolyard DOB: [birth year]  REASON FOR VISIT: follow up HISTORY FROM: patient  HISTORY OF PRESENT ILLNESS: Today 03/08/18: Mr. Vizcarrondo is a 67 year old male with a history of obstructive sleep apnea on CPAP.  He returns today for his first CPAP download.  His download indicates that he uses machine 29 out of 30 days for compliance of 97%.  He uses machine greater than 4 hours 26 out of 30 days for compliance of 87%.  On average he uses his machine 5 hours and 21 minutes.  His residual AHI is 4.7 on 5 to 15 cm of water with EPR of 2.  His pressure in the 95th percentile is 9 cm of water.  He reports that he has seen a benefit from using the CPAP machine.  He returns today for evaluation.   HISTORY Deandrew Hoecker is a 67 y.o. male , seen here  in a referral from Dr. Melford Aase for a sleep apnea evaluation.   Mr. Boschee is a younger than his numeric age appearing 34 male patient referred by Dr. Titus Mould office for a sleep evaluation.  The patient reports that his girlfriend had made him aware of his snoring, she also has noted pauses in his breathing but this seems only to last for 3-5 seconds rather briefly.  He himself feels that his sleep is normal, sound refreshing and restorative.  He usually gets 6 hours of uninterrupted sleep each night.  He is on a variety of medication, bisoprolol, for blood pressure because of vitamin D supplement, Depakote 2 times daily, and he takes as needed Imodium for loose stools.   Chief complaint according to patient :  Sleep habits are as follows: The patient reports that he will fall asleep immediately if he would be reading in the evening.  For the last hour before he goes to bed he usually watches TV. The patient's bedtime is around 10 PM, his bedroom is cool, quiet and dark.  He prefers to sleep on his side but due to a shoulder injury on the left side prefers now his right.  He also may sleep prone or supine.  He uses one pillow for head  support only.  He may have one bathroom break around 2 AM but not every night. There is no difficulty with initiating sleep but sometimes was waking up too early. He may have nocturia times one at 2 AM. He will wakes spontaneously between 4 and 4:30 AM and likes to rise early to be early in his office. On weekends he goes and wakes an hour later.    Sleep medical history and family sleep history:  Adoptee, no sleep walking, no night terrors.    Social history:  No tobacco use, drinks red wine,  1-2 glasses, on weekends up to 3 y.  Caffeine - one cup a day, in AM, no iced tea nor sodas.   REVIEW OF SYSTEMS: Out of a complete 14 system review of symptoms, the patient complains only of the following symptoms, and all other reviewed systems are negative.  See HPI  ALLERGIES: Allergies  Allergen Reactions  . Ciprofloxacin     HOME MEDICATIONS: Outpatient Medications Prior to Visit  Medication Sig Dispense Refill  . ASPIRIN 81 PO Take by mouth.    . bisoprolol-hydrochlorothiazide (ZIAC) 5-6.25 MG tablet TAKE 1 TABLET BY MOUTH EVERY DAY FOR BLOOD PRESSURE. 90 tablet 1  . Cholecalciferol (VITAMIN D3 PO) Take 15,000 Units by mouth.    . divalproex (DEPAKOTE)  250 MG DR tablet Take 500 mg by mouth 2 (two) times daily.  4  . loperamide (IMODIUM A-D) 2 MG tablet Take 2 mg by mouth 4 (four) times daily as needed for diarrhea or loose stools.    . bisoprolol (ZEBETA) 5 MG tablet Take 1 tablet daily for BP 90 tablet 1   No facility-administered medications prior to visit.     PAST MEDICAL HISTORY: Past Medical History:  Diagnosis Date  . Arthritis   . Bipolar disorder ([place])   . Wears glasses    reading    PAST SURGICAL HISTORY: Past Surgical History:  Procedure Laterality Date  . APPENDECTOMY    . COLONOSCOPY    . KNEE ARTHROSCOPY W/ MENISCAL REPAIR  2004   left  . ORIF HUMERUS FRACTURE Left 09/19/2013   Procedure: LEFT OPEN REDUCTION INTERNAL FIXATION (ORIF) PROXIMAL HUMERUS;  ROTATOR CUFF REPAIR;  Surgeon: [name], MD;  Location: [facility];  Service: Orthopedics;  Laterality: Left;  . TONSILLECTOMY      FAMILY HISTORY: History reviewed. No pertinent family history.  SOCIAL HISTORY: Social History   Socioeconomic History  . Marital status: Legally Separated    Spouse name: Not on file  . Number of children: Not on file  . Years of education: Not on file  . Highest education level: Not on file  Occupational History  . Not on file  Social Needs  . Financial resource strain: Not on file  . Food insecurity:    Worry: Not on file    Inability: Not on file  . Transportation needs:    Medical: Not on file    Non-medical: Not on file  Tobacco Use  . Smoking status: Never Smoker  . Smokeless tobacco: Never Used  Substance and Sexual Activity  . Alcohol use: Yes    Comment: 5 days a week  . Drug use: No  . Sexual activity: Not on file  Lifestyle  . Physical activity:    Days per week: Not on file    Minutes per session: Not on file  . Stress: Not on file  Relationships  . Social connections:    Talks on phone: Not on file    Gets together: Not on file    Attends religious service: Not on file    Active member of club or organization: Not on file    Attends meetings of clubs or organizations: Not on file    Relationship status: Not on file  . Intimate partner violence:    Fear of current or ex partner: Not on file    Emotionally abused: Not on file    Physically abused: Not on file    Forced sexual activity: Not on file  Other Topics Concern  . Not on file  Social History Narrative  . Not on file      PHYSICAL EXAM  Vitals:   03/08/18 1009  BP: 112/70  Pulse: 60  Weight: 184 lb (83.5 kg)  Height: 5\' 9"  (1.753 m)   Body mass index is 27.17 kg/m.  Generalized: Well developed, in no acute distress   Neurological examination  Mentation: Alert oriented to time, place, history taking. Follows all commands  speech and language fluent Cranial nerve II-XII: Pupils were equal round reactive to light. Extraocular movements were full, visual field were full on confrontational test. Facial sensation and strength were normal. Uvula tongue midline. Head turning and shoulder shrug  were normal and symmetric.  Neck circumference 15  inches, Mallampati 4+  motor: The motor testing reveals 5 over 5 strength of all 4 extremities. Good symmetric motor tone is noted throughout.  Sensory: Sensory testing is intact to soft touch on all 4 extremities. No evidence of extinction is noted.  Coordination: Cerebellar testing reveals good finger-nose-finger and heel-to-shin bilaterally.  Gait and station: Gait is normal.   DIAGNOSTIC DATA (LABS, IMAGING, TESTING) - I reviewed patient records, labs, notes, testing and imaging myself where available.  Lab Results  Component Value Date   WBC 8.2 11/06/2017   HGB 15.7 11/06/2017   HCT 45.9 11/06/2017   MCV 89.1 11/06/2017   PLT 231 11/06/2017      Component Value Date/Time   NA 135 11/06/2017 1014   K 4.2 11/06/2017 1014   CL 96 (L) 11/06/2017 1014   CO2 33 (H) 11/06/2017 1014   GLUCOSE 119 (H) 11/06/2017 1014   BUN 17 11/06/2017 1014   CREATININE 0.86 11/06/2017 1014   CALCIUM 9.6 11/06/2017 1014   PROT 7.3 11/06/2017 1014   ALBUMIN 4.1 09/01/2016 1519   AST 13 11/06/2017 1014   ALT 12 11/06/2017 1014   ALKPHOS 45 09/01/2016 1519   BILITOT 0.6 11/06/2017 1014   GFRNONAA 90 11/06/2017 1014   GFRAA 104 11/06/2017 1014   Lab Results  Component Value Date   CHOL 133 04/05/2017   HDL 49 04/05/2017   LDLCALC 63 04/05/2017   TRIG 120 04/05/2017   CHOLHDL 2.7 04/05/2017   Lab Results  Component Value Date   HGBA1C 5.0 04/05/2017   No results found for: [id number] Lab Results  Component Value Date   TSH 0.45 04/05/2017      ASSESSMENT AND PLAN 67 y.o. year old male  has a past medical history of Arthritis, Bipolar disorder ([place]), and Wears  glasses. here with :  1.  Obstructive sleep apnea on CPAP  Patient CPAP download shows excellent compliance and good treatment of his apnea.  He is encouraged to continue using CPAP nightly and greater than 4 hours each night.  He is advised that if his symptoms worsen or he develops new symptoms he should let us know.  He will follow-up in 6 months or sooner if needed.  I spent 15 minutes with the patient. 50% of this time was spent reviewing CPAP download   Ward Givens, MSN, NP-C 03/08/2018, 10:27 AM [facility] [address], [place], [place] [address] [phone]

## 2018-03-08 NOTE — Patient Instructions (Signed)
Your Plan:  Continue using CPAP nightly and >4 hours each night If your symptoms worsen or you develop new symptoms please let us know.   Thank you for coming to see us at [facility]. I hope we have been able to provide you high quality care today.  You may receive a patient satisfaction survey over the next few weeks. We would appreciate your feedback and comments so that we may continue to improve ourselves and the health of our patients.  

## 2018-03-11 ENCOUNTER — Other Ambulatory Visit: Payer: Self-pay | Admitting: Internal Medicine

## 2018-03-11 DIAGNOSIS — I10 Essential (primary) hypertension: Secondary | ICD-10-CM

## 2018-03-15 DIAGNOSIS — M1612 Unilateral primary osteoarthritis, left hip: Secondary | ICD-10-CM | POA: Diagnosis not present

## 2018-03-15 DIAGNOSIS — M7062 Trochanteric bursitis, left hip: Secondary | ICD-10-CM | POA: Diagnosis not present

## 2018-04-16 DIAGNOSIS — M1612 Unilateral primary osteoarthritis, left hip: Secondary | ICD-10-CM | POA: Diagnosis not present

## 2018-05-20 ENCOUNTER — Encounter: Payer: Self-pay | Admitting: Internal Medicine

## 2018-05-20 NOTE — Patient Instructions (Signed)

## 2018-05-20 NOTE — Progress Notes (Signed)
[facility]   Timothy [name], M.D.     Timothy [name]. Timothy [name], P.A.-C Timothy [name]                [address] [place], [place] [address] Telephone [phone] Telefax [phone] Annual  Screening/Preventative Visit  & Comprehensive Evaluation & Examination     This very nice 67 y.o. Timothy [name] presents for a Screening /Preventative Visit & comprehensive evaluation and management of multiple medical co-morbidities.  Patient has been followed for labile HTN, HLD, Prediabetes and Vitamin D Deficiency. Patient is on Depakote for Depression followed by Timothy [name]. Patient has OSA & is on CPAP followed by Timothy [name]. Patient has hx/o IBS-D and is stable on Loperamide 1 tablet daily.     HTN predates circa Nov 2017. Patient's BP has been controlled at home.  Today's BP is at goal -  114/80. Patient denies any cardiac symptoms as chest pain, palpitations, shortness of breath, dizziness or ankle swelling.     Patient's lipids are controlled with diet. Last lipids were at goal: Lab Results  Component Value Date   CHOL 133 04/05/2017   HDL 49 04/05/2017   LDLCALC 63 04/05/2017   TRIG 120 04/05/2017   CHOLHDL 2.7 04/05/2017      Patient has is monitored expectantly for glucose intolerance and patient denies reactive hypoglycemic symptoms, visual blurring, diabetic polys or paresthesias. Last A1c was Normal & at goal: Lab Results  Component Value Date   HGBA1C 5.0 04/05/2017       Finally, patient has history of Vitamin D Deficiency ("28" / 2017)  and last vitamin D was at goal: Lab Results  Component Value Date   VD25OH 98 11/06/2017   Current Outpatient Medications on File Prior to Visit  Medication Sig  . ASPIRIN 81 PO Take  1 tablet daily .  . bisoprolol-hctz 5-6.25 MG tablet TAKE 1 TABLET  EVERY DAY   . VITAMIN D Take 15,000 Units daily   . divalproex (DEPAKOTE) 250 MG Timothy  Take 500 mg by  mouth 2 (two) times daily.  . Loperamide / IMODIUM 2 MG  Take 2 mg  4  times daily as needed  - usu. takes 1 tab /day   Allergies  Allergen Reactions  . Ciprofloxacin    Past Medical History:  Diagnosis Date  . Arthritis   . Bipolar disorder ([place])   . Wears glasses    reading   Health Maintenance  Topic Date Due  . PNA vac Low Risk Adult (2 of 2 - PPSV23) 10/29/2018 (Originally 04/05/2018)  . COLONOSCOPY  11/07/2018 (Originally 10/30/2018)  . Hepatitis C Screening  11/07/2018 (Originally 08/09/1950)  . TETANUS/TDAP  11/07/2027  . INFLUENZA VACCINE  Discontinued   Immunization History  Administered Date(s) Administered  . Pneumococcal Conjugate-13 04/05/2017  . Td 11/06/2017   Last Colon - 10/30/2015 - Timothy [name] recc 5 yr f/u  Past Surgical History:  Procedure Laterality Date  . APPENDECTOMY    . COLONOSCOPY    . KNEE ARTHROSCOPY W/ MENISCAL REPAIR  2004   left  . ORIF HUMERUS FRACTURE Left 09/19/2013   Procedure: LEFT OPEN REDUCTION INTERNAL FIXATION (ORIF) PROXIMAL HUMERUS; ROTATOR CUFF REPAIR;  Surgeon: Timothy Lights, MD;  Location: [facility];  Service: Orthopedics;  Laterality: Left;  . TONSILLECTOMY     History reviewed.  No pertinent family history.   Social History   Socioeconomic History  . Marital status: Legally Separated  . Number of children: 4 children & 11 Grandchildren  Occupational History  . Attorney  Tobacco Use  . Smoking status: Never Smoker  . Smokeless tobacco: Never Used  Substance and Sexual Activity  . Alcohol use: Yes    Comment: 5 days a week  . Drug use: No  . Sexual activity: Not on file    ROS Constitutional: Denies fever, chills, weight loss/gain, headaches, insomnia,  night sweats or change in appetite. Does c/o fatigue. Eyes: Denies redness, blurred vision, diplopia, discharge, itchy or watery eyes.  ENT: Denies discharge, congestion, post nasal drip, epistaxis, sore throat, earache, hearing loss, dental pain,  Tinnitus, Vertigo, Sinus pain or snoring.  Cardio: Denies chest pain, palpitations, irregular heartbeat, syncope, dyspnea, diaphoresis, orthopnea, PND, claudication or edema Respiratory: denies cough, dyspnea, DOE, pleurisy, hoarseness, laryngitis or wheezing.  Gastrointestinal: Denies dysphagia, heartburn, reflux, water brash, pain, cramps, nausea, vomiting, bloating, diarrhea, constipation, hematemesis, melena, hematochezia, jaundice or hemorrhoids Genitourinary: Denies dysuria, frequency, discharge, hematuria or flank pain. Has urgency, nocturia x 2-3 & occasional hesitancy. Musculoskeletal: Denies arthralgia, myalgia, stiffness, Jt. Swelling, pain, limp or strain/sprain. Denies Falls. Skin: Denies puritis, rash, hives, warts, acne, eczema or change in skin lesion Neuro: No weakness, tremor, incoordination, spasms, paresthesia or pain Psychiatric: Denies confusion, memory loss or sensory loss. Denies Depression. Endocrine: Denies change in weight, skin, hair change, nocturia, and paresthesia, diabetic polys, visual blurring or hyper / hypo glycemic episodes.  Heme/Lymph: No excessive bleeding, bruising or enlarged lymph nodes.  Physical Exam  BP 114/80   Pulse 64   Temp (!) 97.5 F (36.4 C)   Resp 16   Ht 5\' 9"  (1.753 m)   Wt 188 lb (85.3 kg)   BMI 27.76 kg/m   General Appearance: Well nourished and well groomed and in no apparent distress.  Eyes: PERRLA, EOMs, conjunctiva no swelling or erythema, normal fundi and vessels. Sinuses: No frontal/maxillary tenderness ENT/Mouth: EACs patent / TMs  nl. Nares clear without erythema, swelling, mucoid exudates. Oral hygiene is good. No erythema, swelling, or exudate. Tongue normal, non-obstructing. Tonsils not swollen or erythematous. Hearing normal.  Neck: Supple, thyroid not palpable. No bruits, nodes or JVD. Respiratory: Respiratory effort normal.  BS equal and clear bilateral without rales, rhonci, wheezing or stridor. Cardio: Heart  sounds are normal with regular rate and rhythm and no murmurs, rubs or gallops. Peripheral pulses are normal and equal bilaterally without edema. No aortic or femoral bruits. Chest: symmetric with normal excursions and percussion.  Abdomen: Soft, with Nl bowel sounds. Nontender, no guarding, rebound, hernias, masses, or organomegaly.  Lymphatics: Non tender without lymphadenopathy.  Musculoskeletal: Full ROM all peripheral extremities, joint stability, 5/5 strength, and normal gait. Skin: Warm and dry without rashes, lesions, cyanosis, clubbing or  ecchymosis.  Neuro: Cranial nerves intact, reflexes equal bilaterally. Normal muscle tone, no cerebellar symptoms. Sensation intact.  Pysch: Alert and oriented X 3 with normal affect, insight and judgment appropriate.   Assessment and Plan  1. Essential hypertension  - EKG 12-Lead - [place], RETROPERITNL ABD,  LTD - Urinalysis, Routine w reflex microscopic - Microalbumin / creatinine urine ratio - CBC with Differential/Platelet - COMPLETE METABOLIC PANEL WITH GFR - Magnesium - TSH  2. Hyperlipidemia, mixed  - EKG 12-Lead - [place], RETROPERITNL ABD,  LTD - Lipid panel - TSH  3. Abnormal glucose  - EKG 12-Lead - [place], RETROPERITNL ABD,  LTD -  Hemoglobin A1c - Insulin, random  4. Vitamin D deficiency  - VITAMIN D 25 Hydroxyl  5. Screening for colorectal cancer  - POC Hemoccult Bld/Stl   6. BPH with obstruction/lower urinary tract symptoms  - PSA  7. Prostate cancer screening  - PSA  8. Screening for ischemic heart disease  - EKG 12-Lead  9. Screening for AAA (aortic abdominal aneurysm)  - [place], RETROPERITNL ABD,  LTD  10. Major depression in remission (HCC)  - Valproic acid level  11. Medication management  - Urinalysis, Routine w reflex microscopic - Microalbumin / creatinine urine ratio - CBC with Differential/Platelet - COMPLETE METABOLIC PANEL WITH GFR - Magnesium - Lipid panel - TSH - Hemoglobin A1c - Insulin,  random - VITAMIN D 25 Hydroxyl - Valproic acid level        Patient was counseled in prudent diet, weight control to achieve/maintain BMI less than 25, BP monitoring, regular exercise and medications as discussed.  Discussed med effects and SE's. Routine screening labs and tests as requested with regular follow-up as recommended. Over 40 minutes of exam, counseling, chart review and high complex critical decision making was performed

## 2018-05-21 ENCOUNTER — Ambulatory Visit (INDEPENDENT_AMBULATORY_CARE_PROVIDER_SITE_OTHER): Payer: Medicare Other | Admitting: Internal Medicine

## 2018-05-21 VITALS — BP 114/80 | HR 64 | Temp 97.5°F | Resp 16 | Ht 69.0 in | Wt 188.0 lb

## 2018-05-21 DIAGNOSIS — E559 Vitamin D deficiency, unspecified: Secondary | ICD-10-CM | POA: Diagnosis not present

## 2018-05-21 DIAGNOSIS — Z1211 Encounter for screening for malignant neoplasm of colon: Secondary | ICD-10-CM

## 2018-05-21 DIAGNOSIS — Z79899 Other long term (current) drug therapy: Secondary | ICD-10-CM | POA: Diagnosis not present

## 2018-05-21 DIAGNOSIS — E782 Mixed hyperlipidemia: Secondary | ICD-10-CM | POA: Diagnosis not present

## 2018-05-21 DIAGNOSIS — N138 Other obstructive and reflux uropathy: Secondary | ICD-10-CM

## 2018-05-21 DIAGNOSIS — Z136 Encounter for screening for cardiovascular disorders: Secondary | ICD-10-CM

## 2018-05-21 DIAGNOSIS — I10 Essential (primary) hypertension: Secondary | ICD-10-CM | POA: Diagnosis not present

## 2018-05-21 DIAGNOSIS — F325 Major depressive disorder, single episode, in full remission: Secondary | ICD-10-CM | POA: Diagnosis not present

## 2018-05-21 DIAGNOSIS — Z125 Encounter for screening for malignant neoplasm of prostate: Secondary | ICD-10-CM

## 2018-05-21 DIAGNOSIS — R7309 Other abnormal glucose: Secondary | ICD-10-CM | POA: Diagnosis not present

## 2018-05-21 DIAGNOSIS — N401 Enlarged prostate with lower urinary tract symptoms: Secondary | ICD-10-CM

## 2018-05-21 DIAGNOSIS — Z1212 Encounter for screening for malignant neoplasm of rectum: Secondary | ICD-10-CM

## 2018-05-22 ENCOUNTER — Encounter: Payer: Self-pay | Admitting: Internal Medicine

## 2018-05-22 LAB — CBC WITH DIFFERENTIAL/PLATELET
Absolute Monocytes: 585 cells/uL (ref 200–950)
Basophils Absolute: 30 cells/uL (ref 0–200)
Basophils Relative: 0.4 %
EOS PCT: 1.7 %
Eosinophils Absolute: 129 cells/uL (ref 15–500)
HEMATOCRIT: 44 % (ref 38.5–50.0)
Hemoglobin: 14.7 g/dL (ref 13.2–17.1)
Lymphs Abs: 2690 cells/uL (ref 850–3900)
MCH: 30.7 pg (ref 27.0–33.0)
MCHC: 33.4 g/dL (ref 32.0–36.0)
MCV: 91.9 fL (ref 80.0–100.0)
MONOS PCT: 7.7 %
MPV: 11.4 fL (ref 7.5–12.5)
NEUTROS PCT: 54.8 %
Neutro Abs: 4165 cells/uL (ref 1500–7800)
PLATELETS: 192 10*3/uL (ref 140–400)
RBC: 4.79 10*6/uL (ref 4.20–5.80)
RDW: 13.1 % (ref 11.0–15.0)
TOTAL LYMPHOCYTE: 35.4 %
WBC: 7.6 10*3/uL (ref 3.8–10.8)

## 2018-05-22 LAB — COMPLETE METABOLIC PANEL WITH GFR
AG Ratio: 1.5 (calc) (ref 1.0–2.5)
ALT: 16 U/L (ref 9–46)
AST: 15 U/L (ref 10–35)
Albumin: 4.1 g/dL (ref 3.6–5.1)
Alkaline phosphatase (APISO): 53 U/L (ref 40–115)
BUN: 22 mg/dL (ref 7–25)
CALCIUM: 9.4 mg/dL (ref 8.6–10.3)
CO2: 33 mmol/L — ABNORMAL HIGH (ref 20–32)
Chloride: 101 mmol/L (ref 98–110)
Creat: 0.79 mg/dL (ref 0.70–1.25)
GFR, Est African American: 108 mL/min/{1.73_m2} (ref >=60)
GFR, Est Non African American: 93 mL/min/{1.73_m2} (ref >=60)
GLUCOSE: 87 mg/dL (ref 65–99)
Globulin: 2.7 g/dL (calc) (ref 1.9–3.7)
Potassium: 4.5 mmol/L (ref 3.5–5.3)
Sodium: 139 mmol/L (ref 135–146)
Total Bilirubin: 0.4 mg/dL (ref 0.2–1.2)
Total Protein: 6.8 g/dL (ref 6.1–8.1)

## 2018-05-22 LAB — LIPID PANEL
CHOL/HDL RATIO: 2.8 (calc) (ref <5.0)
Cholesterol: 163 mg/dL (ref <200)
HDL: 59 mg/dL (ref >40)
LDL Cholesterol (Calc): 80 mg/dL (calc)
Non-HDL Cholesterol (Calc): 104 mg/dL (calc) (ref <130)
TRIGLYCERIDES: 139 mg/dL (ref <150)

## 2018-05-22 LAB — HEMOGLOBIN A1C
Hgb A1c MFr Bld: 5.2 % of total Hgb (ref <5.7)
Mean Plasma Glucose: 103 (calc)
eAG (mmol/L): 5.7 (calc)

## 2018-05-22 LAB — URINALYSIS, ROUTINE W REFLEX MICROSCOPIC
BILIRUBIN URINE: NEGATIVE
Glucose, UA: NEGATIVE
Hgb urine dipstick: NEGATIVE
Ketones, ur: NEGATIVE
Leukocytes, UA: NEGATIVE
Nitrite: NEGATIVE
Protein, ur: NEGATIVE
SPECIFIC GRAVITY, URINE: 1.014 (ref 1.001–1.03)
pH: 7 (ref 5.0–8.0)

## 2018-05-22 LAB — INSULIN, RANDOM: Insulin: 4.8 u[IU]/mL (ref 2.0–19.6)

## 2018-05-22 LAB — VITAMIN D 25 HYDROXY (VIT D DEFICIENCY, FRACTURES): Vit D, 25-Hydroxy: 121 ng/mL — ABNORMAL HIGH (ref 30–100)

## 2018-05-22 LAB — MICROALBUMIN / CREATININE URINE RATIO
CREATININE, URINE: 60 mg/dL (ref 20–320)
MICROALB UR: 0.6 mg/dL
Microalb Creat Ratio: 10 mcg/mg creat (ref <30)

## 2018-05-22 LAB — PSA: PSA: 1 ng/mL (ref <=4.0)

## 2018-05-22 LAB — TSH: TSH: 0.6 mIU/L (ref 0.40–4.50)

## 2018-05-22 LAB — MAGNESIUM: Magnesium: 1.9 mg/dL (ref 1.5–2.5)

## 2018-05-22 LAB — VALPROIC ACID LEVEL: Valproic Acid Lvl: 44 mg/L — ABNORMAL LOW (ref 50.0–100.0)

## 2018-05-22 MED ORDER — ASPIRIN EC 81 MG PO TBEC: DELAYED_RELEASE_TABLET | ORAL | Status: DC

## 2018-05-27 DIAGNOSIS — Z23 Encounter for immunization: Secondary | ICD-10-CM | POA: Diagnosis not present

## 2018-06-11 DIAGNOSIS — M25552 Pain in left hip: Secondary | ICD-10-CM | POA: Diagnosis not present

## 2018-07-05 NOTE — Progress Notes (Signed)
Please place orders in Epic as patient has a Pre-op appointment on 07/12/18 for surgery on 07/18/18! Thank you!

## 2018-07-06 ENCOUNTER — Ambulatory Visit: Payer: Self-pay | Admitting: Orthopedic Surgery

## 2018-07-10 ENCOUNTER — Ambulatory Visit: Payer: Self-pay | Admitting: Orthopedic Surgery

## 2018-07-10 NOTE — Patient Instructions (Signed)
Timothy [name]  07/10/2018   Your procedure is scheduled on: 07-18-18   Report to [facility] Main  Entrance              Report to admitting at        0900 AM    Call this number if you have problems the morning of surgery [phone]    Remember: Do not eat food or drink liquids :After Midnight.  BRUSH YOUR TEETH MORNING OF SURGERY AND RINSE YOUR MOUTH OUT, NO CHEWING GUM CANDY OR MINTS.     Take these medicines the morning of surgery with A SIP OF WATER: depakote                                You may not have any metal on your body including hair pins and              piercings  Do not wear jewelry, lotions, powders or perfumes, deodorant               Men may shave face and neck.   Do not bring valuables to the hospital. [place].  Contacts, dentures or bridgework may not be worn into surgery.  Leave suitcase in the car. After surgery it may be brought to your room.               Please read over the following fact sheets you were given: _____________________________________________________________________            [facility] - Preparing for Surgery Before surgery, you can play an important role.  Because skin is not sterile, your skin needs to be as free of germs as possible.  You can reduce the number of germs on your skin by washing with CHG (chlorahexidine gluconate) soap before surgery.  CHG is an antiseptic cleaner which kills germs and bonds with the skin to continue killing germs even after washing. Please DO NOT use if you have an allergy to CHG or antibacterial soaps.  If your skin becomes reddened/irritated stop using the CHG and inform your nurse when you arrive at Short Stay. Do not shave (including legs and underarms) for at least 48 hours prior to the first CHG shower.  You may shave your face/neck. Please follow these instructions carefully:  1.  Shower with CHG Soap the night  before surgery and the  morning of Surgery.  2.  If you choose to wash your hair, wash your hair first as usual with your  normal  shampoo.  3.  After you shampoo, rinse your hair and body thoroughly to remove the  shampoo.                           4.  Use CHG as you would any other liquid soap.  You can apply chg directly  to the skin and wash                       Gently with a scrungie or clean washcloth.  5.  Apply the CHG Soap to your body ONLY FROM THE NECK DOWN.   Do not use on face/ open  Wound or open sores. Avoid contact with eyes, ears mouth and genitals (private parts).                       Wash face,  Genitals (private parts) with your normal soap.             6.  Wash thoroughly, paying special attention to the area where your surgery  will be performed.  7.  Thoroughly rinse your body with warm water from the neck down.  8.  DO NOT shower/wash with your normal soap after using and rinsing off  the CHG Soap.                9.  Pat yourself dry with a clean towel.            10.  Wear clean pajamas.            11.  Place clean sheets on your bed the night of your first shower and do not  sleep with pets. Day of Surgery : Do not apply any lotions/deodorants the morning of surgery.  Please wear clean clothes to the hospital/surgery center.  FAILURE TO FOLLOW THESE INSTRUCTIONS MAY RESULT IN THE CANCELLATION OF YOUR SURGERY PATIENT SIGNATURE_________________________________  NURSE SIGNATURE__________________________________  ________________________________________________________________________  WHAT IS A BLOOD TRANSFUSION? Blood Transfusion Information  A transfusion is the replacement of blood or some of its parts. Blood is made up of multiple cells which provide different functions.  Red blood cells carry oxygen and are used for blood loss replacement.  White blood cells fight against infection.  Platelets control bleeding.  Plasma helps clot  blood.  Other blood products are available for specialized needs, such as hemophilia or other clotting disorders. BEFORE THE TRANSFUSION  Who gives blood for transfusions?   Healthy volunteers who are fully evaluated to make sure their blood is safe. This is blood bank blood. Transfusion therapy is the safest it has ever been in the practice of medicine. Before blood is taken from a donor, a complete history is taken to make sure that person has no history of diseases nor engages in risky social behavior (examples are intravenous drug use or sexual activity with multiple partners). The donor's travel history is screened to minimize risk of transmitting infections, such as malaria. The donated blood is tested for signs of infectious diseases, such as HIV and hepatitis. The blood is then tested to be sure it is compatible with you in order to minimize the chance of a transfusion reaction. If you or a relative donates blood, this is often done in anticipation of surgery and is not appropriate for emergency situations. It takes many days to process the donated blood. RISKS AND COMPLICATIONS Although transfusion therapy is very safe and saves many lives, the main dangers of transfusion include:   Getting an infectious disease.  Developing a transfusion reaction. This is an allergic reaction to something in the blood you were given. Every precaution is taken to prevent this. The decision to have a blood transfusion has been considered carefully by your caregiver before blood is given. Blood is not given unless the benefits outweigh the risks. AFTER THE TRANSFUSION  Right after receiving a blood transfusion, you will usually feel much better and more energetic. This is especially true if your red blood cells have gotten low (anemic). The transfusion raises the level of the red blood cells which carry oxygen, and this usually causes an energy increase.  The  nurse administering the transfusion will monitor  you carefully for complications. HOME CARE INSTRUCTIONS  No special instructions are needed after a transfusion. You may find your energy is better. Speak with your caregiver about any limitations on activity for underlying diseases you may have. SEEK MEDICAL CARE IF:   Your condition is not improving after your transfusion.  You develop redness or irritation at the intravenous (IV) site. SEEK IMMEDIATE MEDICAL CARE IF:  Any of the following symptoms occur over the next 12 hours:  Shaking chills.  You have a temperature by mouth above 102 F (38.9 C), not controlled by medicine.  Chest, back, or muscle pain.  People around you feel you are not acting correctly or are confused.  Shortness of breath or difficulty breathing.  Dizziness and fainting.  You get a rash or develop hives.  You have a decrease in urine output.  Your urine turns a dark color or changes to pink, red, or brown. Any of the following symptoms occur over the next 10 days:  You have a temperature by mouth above 102 F (38.9 C), not controlled by medicine.  Shortness of breath.  Weakness after normal activity.  The white part of the eye turns yellow (jaundice).  You have a decrease in the amount of urine or are urinating less often.  Your urine turns a dark color or changes to pink, red, or brown. Document Released: 05/13/2000 Document Revised: 08/08/2011 Document Reviewed: 12/31/2007 ExitCare Patient Information 2014 [place].  _______________________________________________________________________  Incentive Spirometer  An incentive spirometer is a tool that can help keep your lungs clear and active. This tool measures how well you are filling your lungs with each breath. Taking long deep breaths may help reverse or decrease the chance of developing breathing (pulmonary) problems (especially infection) following:  A long period of time when you are unable to move or be active. BEFORE THE  PROCEDURE   If the spirometer includes an indicator to show your best effort, your nurse or respiratory therapist will set it to a desired goal.  If possible, sit up straight or lean slightly forward. Try not to slouch.  Hold the incentive spirometer in an upright position. INSTRUCTIONS FOR USE  1. Sit on the edge of your bed if possible, or sit up as far as you can in bed or on a chair. 2. Hold the incentive spirometer in an upright position. 3. Breathe out normally. 4. Place the mouthpiece in your mouth and seal your lips tightly around it. 5. Breathe in slowly and as deeply as possible, raising the piston or the ball toward the top of the column. 6. Hold your breath for 3-5 seconds or for as long as possible. Allow the piston or ball to fall to the bottom of the column. 7. Remove the mouthpiece from your mouth and breathe out normally. 8. Rest for a few seconds and repeat Steps 1 through 7 at least 10 times every 1-2 hours when you are awake. Take your time and take a few normal breaths between deep breaths. 9. The spirometer may include an indicator to show your best effort. Use the indicator as a goal to work toward during each repetition. 10. After each set of 10 deep breaths, practice coughing to be sure your lungs are clear. If you have an incision (the cut made at the time of surgery), support your incision when coughing by placing a pillow or rolled up towels firmly against it. Once you are able to get  out of bed, walk around indoors and cough well. You may stop using the incentive spirometer when instructed by your caregiver.  RISKS AND COMPLICATIONS  Take your time so you do not get dizzy or light-headed.  If you are in pain, you may need to take or ask for pain medication before doing incentive spirometry. It is harder to take a deep breath if you are having pain. AFTER USE  Rest and breathe slowly and easily.  It can be helpful to keep track of a log of your progress. Your  caregiver can provide you with a simple table to help with this. If you are using the spirometer at home, follow these instructions: [place] IF:   You are having difficultly using the spirometer.  You have trouble using the spirometer as often as instructed.  Your pain medication is not giving enough relief while using the spirometer.  You develop fever of 100.5 F (38.1 C) or higher. SEEK IMMEDIATE MEDICAL CARE IF:   You cough up bloody sputum that had not been present before.  You develop fever of 102 F (38.9 C) or greater.  You develop worsening pain at or near the incision site. MAKE SURE YOU:   Understand these instructions.  Will watch your condition.  Will get help right away if you are not doing well or get worse. Document Released: 09/26/2006 Document Revised: 08/08/2011 Document Reviewed: 11/27/2006 [employer] Patient Information 2014 [place], [place].   ________________________________________________________________________

## 2018-07-10 NOTE — H&P (Signed)
TOTAL HIP ADMISSION H&P  Patient is admitted for left total hip arthroplasty.  Subjective:  Chief Complaint: left hip pain  HPI: Timothy [name], 68 y.o. male, has a history of pain and functional disability in the left hip(s) due to arthritis and patient has failed non-surgical conservative treatments for greater than 12 weeks to include NSAID's and/or analgesics, flexibility and strengthening excercises, use of assistive devices, weight reduction as appropriate and activity modification.  Onset of symptoms was gradual starting 2 years ago with gradually worsening course since that time.The patient noted no past surgery on the left hip(s).  Patient currently rates pain in the left hip at 10 out of 10 with activity. Patient has night pain, worsening of pain with activity and weight bearing, trendelenberg gait, pain that interfers with activities of daily living and pain with passive range of motion. Patient has evidence of subchondral cysts, subchondral sclerosis, periarticular osteophytes and joint space narrowing by imaging studies. This condition presents safety issues increasing the risk of falls. There is no current active infection.  Patient Active Problem List   Diagnosis Date Noted  . Tubular adenoma of colon 11/09/2017  . Snoring 08/28/2017  . Depression 07/21/2016  . HTN (hypertension) 03/14/2016  . Vitamin D deficiency 03/14/2016  . Other abnormal glucose 03/14/2016  . Diarrhea 03/14/2016  . Medication management 03/14/2016   Past Medical History:  Diagnosis Date  . Arthritis   . Bipolar disorder ([place])    controlled by meds since 1997  . Pneumonia   . Sleep apnea    cpap  . Wears glasses    reading    Past Surgical History:  Procedure Laterality Date  . APPENDECTOMY    . COLONOSCOPY    . KNEE ARTHROSCOPY W/ MENISCAL REPAIR  2004   left  . ORIF HUMERUS FRACTURE Left 09/19/2013   Procedure: LEFT OPEN REDUCTION INTERNAL FIXATION (ORIF) PROXIMAL HUMERUS; ROTATOR CUFF REPAIR;   Surgeon: [name], MD;  Location: [facility];  Service: Orthopedics;  Laterality: Left;  . TONSILLECTOMY      No current facility-administered medications for this visit.    No current outpatient medications on file.   Facility-Administered Medications Ordered in Other Visits  Medication Dose Route Frequency Provider Last Rate Last Dose  . 0.9 %  sodium chloride infusion   Intravenous Continuous Passion Lavin, Aaron Edelman, MD      . acetaminophen (OFIRMEV) IV 1,000 mg  1,000 mg Intravenous To OR Markanthony Gedney, Aaron Edelman, MD      . bisoprolol-hydrochlorothiazide [facility]) 5-6.25 MG per tablet 1 tablet  1 tablet Oral Daily Duane Boston, MD   1 tablet at 07/18/18 1005  . ceFAZolin (ANCEF) IVPB 2g/100 mL premix  2 g Intravenous On Call to Purcell, Aaron Edelman, MD      . chlorhexidine (HIBICLENS) 4 % liquid 4 application  60 mL Topical Once Andreya Lacks, Aaron Edelman, MD      . chlorhexidine (HIBICLENS) 4 % liquid 4 application  60 mL Topical Once Rod Can, MD      . lactated ringers infusion   Intravenous Continuous Duane Boston, MD 20 mL/hr at 07/18/18 [phone]    . povidone-iodine 10 % swab 2 application  2 application Topical Once Amit Meloy, Aaron Edelman, MD      . tranexamic acid (CYKLOKAPRON) 1000MG /143mL IVPB           . tranexamic acid (CYKLOKAPRON) IVPB 1,000 mg  1,000 mg Intravenous To OR Zareya Tuckett, Aaron Edelman, MD       Allergies  Allergen Reactions  .  Ciprofloxacin Diarrhea    Social History   Tobacco Use  . Smoking status: Never Smoker  . Smokeless tobacco: Never Used  Substance Use Topics  . Alcohol use: Yes    Comment: 5 days a week    No family history on file.   Review of Systems  Constitutional: Negative.   HENT: Negative.   Eyes: Negative.   Respiratory: Negative.   Cardiovascular: Negative.   Gastrointestinal: Negative.   Genitourinary: Negative.   Musculoskeletal: Positive for joint pain.  Skin: Negative.   Neurological: Negative.   Endo/Heme/Allergies: Negative.    Psychiatric/Behavioral: Negative.     Objective:  Physical Exam  Vitals reviewed. Constitutional: He is oriented to person, place, and time. He appears well-developed and well-nourished.  HENT:  Head: Normocephalic and atraumatic.  Eyes: Pupils are equal, round, and reactive to light. Conjunctivae and EOM are normal.  Neck: Normal range of motion. No thyromegaly present.  Cardiovascular: Normal rate, regular rhythm and intact distal pulses.  Respiratory: Effort normal. No respiratory distress.  GI: Soft. He exhibits no distension.  Genitourinary:    Genitourinary Comments: deferred   Musculoskeletal:     Left hip: He exhibits decreased range of motion, decreased strength and bony tenderness.  Neurological: He is alert and oriented to person, place, and time. He has normal reflexes.  Skin: Skin is warm and dry. No rash noted. No erythema. No pallor.  Psychiatric: He has a normal mood and affect. His behavior is normal. Judgment and thought content normal.    Vital signs in last 24 hours: @VSRANGES @  Labs:   Estimated body mass index is 26.73 kg/m as calculated from the following:   Height as of 07/18/18: 5\' 9"  (1.753 m).   Weight as of 07/18/18: 82.1 kg.   Imaging Review Plain radiographs demonstrate severe degenerative joint disease of the left hip(s). The bone quality appears to be adequate for age and reported activity level.      Assessment/Plan:  End stage arthritis, left hip(s)  The patient history, physical examination, clinical judgement of the provider and imaging studies are consistent with end stage degenerative joint disease of the left hip(s) and total hip arthroplasty is deemed medically necessary. The treatment options including medical management, injection therapy, arthroscopy and arthroplasty were discussed at length. The risks and benefits of total hip arthroplasty were presented and reviewed. The risks due to aseptic loosening, infection, stiffness,  dislocation/subluxation,  thromboembolic complications and other imponderables were discussed.  The patient acknowledged the explanation, agreed to proceed with the plan and consent was signed. Patient is being admitted for inpatient treatment for surgery, pain control, PT, OT, prophylactic antibiotics, VTE prophylaxis, progressive ambulation and ADL's and discharge planning.The patient is planning to be discharged home with HEP    Patient's anticipated LOS is less than 2 midnights, meeting these requirements: - Younger than 86 - Lives within 1 hour of care - Has a competent adult at home to recover with post-op recover - NO history of  - Chronic pain requiring opiods  - Diabetes  - Coronary Artery Disease  - Heart failure  - Heart attack  - Stroke  - DVT/VTE  - Cardiac arrhythmia  - Respiratory Failure/COPD  - Renal failure  - Anemia  - Advanced Liver disease

## 2018-07-10 NOTE — H&P (View-Only) (Signed)
TOTAL HIP ADMISSION H&P  Patient is admitted for left total hip arthroplasty.  Subjective:  Chief Complaint: left hip pain  HPI: Timothy [name], 68 y.o. male, has a history of pain and functional disability in the left hip(s) due to arthritis and patient has failed non-surgical conservative treatments for greater than 12 weeks to include NSAID's and/or analgesics, flexibility and strengthening excercises, use of assistive devices, weight reduction as appropriate and activity modification.  Onset of symptoms was gradual starting 2 years ago with gradually worsening course since that time.The patient noted no past surgery on the left hip(s).  Patient currently rates pain in the left hip at 10 out of 10 with activity. Patient has night pain, worsening of pain with activity and weight bearing, trendelenberg gait, pain that interfers with activities of daily living and pain with passive range of motion. Patient has evidence of subchondral cysts, subchondral sclerosis, periarticular osteophytes and joint space narrowing by imaging studies. This condition presents safety issues increasing the risk of falls. There is no current active infection.  Patient Active Problem List   Diagnosis Date Noted  . Tubular adenoma of colon 11/09/2017  . Snoring 08/28/2017  . Depression 07/21/2016  . HTN (hypertension) 03/14/2016  . Vitamin D deficiency 03/14/2016  . Other abnormal glucose 03/14/2016  . Diarrhea 03/14/2016  . Medication management 03/14/2016   Past Medical History:  Diagnosis Date  . Arthritis   . Bipolar disorder ([place])    controlled by meds since 1997  . Pneumonia   . Sleep apnea    cpap  . Wears glasses    reading    Past Surgical History:  Procedure Laterality Date  . APPENDECTOMY    . COLONOSCOPY    . KNEE ARTHROSCOPY W/ MENISCAL REPAIR  2004   left  . ORIF HUMERUS FRACTURE Left 09/19/2013   Procedure: LEFT OPEN REDUCTION INTERNAL FIXATION (ORIF) PROXIMAL HUMERUS; ROTATOR CUFF REPAIR;   Surgeon: [name], MD;  Location: [facility];  Service: Orthopedics;  Laterality: Left;  . TONSILLECTOMY      No current facility-administered medications for this visit.    No current outpatient medications on file.   Facility-Administered Medications Ordered in Other Visits  Medication Dose Route Frequency Provider Last Rate Last Dose  . 0.9 %  sodium chloride infusion   Intravenous Continuous Brielle Moro, Aaron Edelman, MD      . acetaminophen (OFIRMEV) IV 1,000 mg  1,000 mg Intravenous To OR Minetta Krisher, Aaron Edelman, MD      . bisoprolol-hydrochlorothiazide [facility]) 5-6.25 MG per tablet 1 tablet  1 tablet Oral Daily Duane Boston, MD   1 tablet at 07/18/18 1005  . ceFAZolin (ANCEF) IVPB 2g/100 mL premix  2 g Intravenous On Call to Condon, Aaron Edelman, MD      . chlorhexidine (HIBICLENS) 4 % liquid 4 application  60 mL Topical Once Rhian Asebedo, Aaron Edelman, MD      . chlorhexidine (HIBICLENS) 4 % liquid 4 application  60 mL Topical Once Rod Can, MD      . lactated ringers infusion   Intravenous Continuous Duane Boston, MD 20 mL/hr at 07/18/18 [phone]    . povidone-iodine 10 % swab 2 application  2 application Topical Once Shalayne Leach, Aaron Edelman, MD      . tranexamic acid (CYKLOKAPRON) 1000MG /122mL IVPB           . tranexamic acid (CYKLOKAPRON) IVPB 1,000 mg  1,000 mg Intravenous To OR Gumaro Brightbill, Aaron Edelman, MD       Allergies  Allergen Reactions  .  Ciprofloxacin Diarrhea    Social History   Tobacco Use  . Smoking status: Never Smoker  . Smokeless tobacco: Never Used  Substance Use Topics  . Alcohol use: Yes    Comment: 5 days a week    No family history on file.   Review of Systems  Constitutional: Negative.   HENT: Negative.   Eyes: Negative.   Respiratory: Negative.   Cardiovascular: Negative.   Gastrointestinal: Negative.   Genitourinary: Negative.   Musculoskeletal: Positive for joint pain.  Skin: Negative.   Neurological: Negative.   Endo/Heme/Allergies: Negative.    Psychiatric/Behavioral: Negative.     Objective:  Physical Exam  Vitals reviewed. Constitutional: He is oriented to person, place, and time. He appears well-developed and well-nourished.  HENT:  Head: Normocephalic and atraumatic.  Eyes: Pupils are equal, round, and reactive to light. Conjunctivae and EOM are normal.  Neck: Normal range of motion. No thyromegaly present.  Cardiovascular: Normal rate, regular rhythm and intact distal pulses.  Respiratory: Effort normal. No respiratory distress.  GI: Soft. He exhibits no distension.  Genitourinary:    Genitourinary Comments: deferred   Musculoskeletal:     Left hip: He exhibits decreased range of motion, decreased strength and bony tenderness.  Neurological: He is alert and oriented to person, place, and time. He has normal reflexes.  Skin: Skin is warm and dry. No rash noted. No erythema. No pallor.  Psychiatric: He has a normal mood and affect. His behavior is normal. Judgment and thought content normal.    Vital signs in last 24 hours: @VSRANGES @  Labs:   Estimated body mass index is 26.73 kg/m as calculated from the following:   Height as of 07/18/18: 5\' 9"  (1.753 m).   Weight as of 07/18/18: 82.1 kg.   Imaging Review Plain radiographs demonstrate severe degenerative joint disease of the left hip(s). The bone quality appears to be adequate for age and reported activity level.      Assessment/Plan:  End stage arthritis, left hip(s)  The patient history, physical examination, clinical judgement of the provider and imaging studies are consistent with end stage degenerative joint disease of the left hip(s) and total hip arthroplasty is deemed medically necessary. The treatment options including medical management, injection therapy, arthroscopy and arthroplasty were discussed at length. The risks and benefits of total hip arthroplasty were presented and reviewed. The risks due to aseptic loosening, infection, stiffness,  dislocation/subluxation,  thromboembolic complications and other imponderables were discussed.  The patient acknowledged the explanation, agreed to proceed with the plan and consent was signed. Patient is being admitted for inpatient treatment for surgery, pain control, PT, OT, prophylactic antibiotics, VTE prophylaxis, progressive ambulation and ADL's and discharge planning.The patient is planning to be discharged home with HEP    Patient's anticipated LOS is less than 2 midnights, meeting these requirements: - Younger than 21 - Lives within 1 hour of care - Has a competent adult at home to recover with post-op recover - NO history of  - Chronic pain requiring opiods  - Diabetes  - Coronary Artery Disease  - Heart failure  - Heart attack  - Stroke  - DVT/VTE  - Cardiac arrhythmia  - Respiratory Failure/COPD  - Renal failure  - Anemia  - Advanced Liver disease

## 2018-07-12 ENCOUNTER — Encounter (HOSPITAL_COMMUNITY): Payer: Self-pay

## 2018-07-12 ENCOUNTER — Encounter (HOSPITAL_COMMUNITY)
Admission: RE | Admit: 2018-07-12 | Discharge: 2018-07-12 | Disposition: A | Discharge status: Home / Self Care | Payer: Medicare Other | Source: Ambulatory Visit | Attending: Orthopedic Surgery | Admitting: Orthopedic Surgery

## 2018-07-12 ENCOUNTER — Other Ambulatory Visit: Payer: Self-pay

## 2018-07-12 DIAGNOSIS — Z01812 Encounter for preprocedural laboratory examination: Secondary | ICD-10-CM | POA: Insufficient documentation

## 2018-07-12 HISTORY — DX: Sleep apnea, unspecified: G47.30

## 2018-07-12 HISTORY — DX: Pneumonia, unspecified organism: J18.9

## 2018-07-12 LAB — BASIC METABOLIC PANEL
Anion gap: 10 (ref 5–15)
BUN: 18 mg/dL (ref 8–23)
CO2: 28 mmol/L (ref 22–32)
Calcium: 9.1 mg/dL (ref 8.9–10.3)
Chloride: 99 mmol/L (ref 98–111)
Creatinine, Ser: 0.89 mg/dL (ref 0.61–1.24)
GFR calc Af Amer: >60 mL/min (ref >60)
GFR calc non Af Amer: >60 mL/min (ref >60)
GLUCOSE: 98 mg/dL (ref 70–99)
Potassium: 4.8 mmol/L (ref 3.5–5.1)
Sodium: 137 mmol/L (ref 135–145)

## 2018-07-12 LAB — CBC
HEMATOCRIT: 47.6 % (ref 39.0–52.0)
Hemoglobin: 15.3 g/dL (ref 13.0–17.0)
MCH: 30.9 pg (ref 26.0–34.0)
MCHC: 32.1 g/dL (ref 30.0–36.0)
MCV: 96.2 fL (ref 80.0–100.0)
Platelets: 164 10*3/uL (ref 150–400)
RBC: 4.95 MIL/uL (ref 4.22–5.81)
RDW: 13.1 % (ref 11.5–15.5)
WBC: 7.8 10*3/uL (ref 4.0–10.5)
nRBC: 0 % (ref 0.0–0.2)

## 2018-07-12 LAB — SURGICAL PCR SCREEN
MRSA, PCR: NEGATIVE
Staphylococcus aureus: NEGATIVE

## 2018-07-12 LAB — ABO/RH: ABO/RH(D): O POS

## 2018-07-12 NOTE — Progress Notes (Addendum)
Clearance Dr. Melford Aase 07-09-18 on chart lov 05-21-18 on chart  ekg 05-20-18 on chart  hgba1c 05-22-18 epic

## 2018-07-12 NOTE — Progress Notes (Signed)
PCP:Dr. [name]  CARDIOLOGIST:none  INFO IN Epic: Cpap use   INFO ON CHART: see below  BLOOD THINNERS AND LAST DOSES:81 mg asa last dose 07-10-18 ____________________________________  PATIENT SYMPTOMS AT TIME OF PREOP:Clearance Dr. Melford Aase 07-09-18 on chart lov 05-21-18 on chart  ekg 05-20-18 on chart  hgba1c 05-22-18 epic

## 2018-07-16 NOTE — Progress Notes (Signed)
Anesthesia Chart Review   Case:  [id number] Date/Time:  07/18/18 1119   Procedure:  TOTAL HIP ARTHROPLASTY ANTERIOR APPROACH (Left )   Anesthesia type:  Spinal   Pre-op diagnosis:  Degenerative joint disease left hip   Location:  [place] [facility]   Surgeon:  Rod Can, MD      DISCUSSION: 68 yo never smoker with h/o OSA w/CPAP, bipolar disorder, left hip DJD scheduled for above procedure 07/18/18 with Dr. Rod Can.   Pt last seen by PCP, Dr. [name], on 05/21/18.  Stable at this visit.  Clearance from Dr. Melford Aase on chart which states pt is low risk for surgical procedure.   Pt can proceed with planned procedure barring acute status change.  VS: BP (!) 146/80   Pulse (!) 51   Temp 36.7 C (Oral)   Resp 16   Ht 5\' 9"  (1.753 m)   Wt 82.1 kg   SpO2 100%   BMI 26.73 kg/m   PROVIDERS: [name], MD is PCP    LABS: Labs reviewed: Acceptable for surgery. (all labs ordered are listed, but only abnormal results are displayed)  Labs Reviewed  SURGICAL PCR SCREEN  BASIC METABOLIC PANEL  CBC  TYPE AND SCREEN  ABO/RH     IMAGES: Carotid Doppler 09/07/2016 Impressions:  Minimal heterogenous plaque, bilaterally.  1-39% bilateral ICA stenosis Normal subclavian arteries, bilaterally Patent vertebral arteries with antegrade flow  EKG: 05/21/18 Rate 54 bpm NSR RSR' V1  CV:  Past Medical History:  Diagnosis Date  . Arthritis   . Bipolar disorder ([place])    controlled by meds since 1997  . Pneumonia   . Sleep apnea    cpap  . Wears glasses    reading    Past Surgical History:  Procedure Laterality Date  . APPENDECTOMY    . COLONOSCOPY    . KNEE ARTHROSCOPY W/ MENISCAL REPAIR  2004   left  . ORIF HUMERUS FRACTURE Left 09/19/2013   Procedure: LEFT OPEN REDUCTION INTERNAL FIXATION (ORIF) PROXIMAL HUMERUS; ROTATOR CUFF REPAIR;  Surgeon: [name], MD;  Location: [place];  Service: Orthopedics;  Laterality: Left;  .  TONSILLECTOMY      MEDICATIONS: . aspirin EC 81 MG tablet  . bisoprolol-hydrochlorothiazide (ZIAC) 5-6.25 MG tablet  . Cholecalciferol (VITAMIN D-3) 125 MCG (5000 UT) TABS  . divalproex (DEPAKOTE) 250 MG DR tablet  . loperamide (IMODIUM A-D) 2 MG tablet  . naproxen sodium (ALEVE) 220 MG tablet   No current facility-administered medications for this encounter.      Maia Plan [facility] Pre-Surgical Testing [phone] 07/16/18 11:36 AM

## 2018-07-16 NOTE — Anesthesia Preprocedure Evaluation (Addendum)
Anesthesia Evaluation  Patient identified by MRN, date of birth, ID band Patient awake    Reviewed: Allergy & Precautions, NPO status , Patient's Chart, lab work & pertinent test results, reviewed documented beta blocker date and time   Airway Mallampati: II  TM Distance: >3 FB Neck ROM: Full    Dental no notable dental hx. (+) Dental Advisory Given   Pulmonary sleep apnea and Continuous Positive Airway Pressure Ventilation ,    Pulmonary exam normal        Cardiovascular hypertension, Pt. on medications and Pt. on home beta blockers Normal cardiovascular exam     Neuro/Psych PSYCHIATRIC DISORDERS Depression Bipolar Disorder negative neurological ROS     GI/Hepatic negative GI ROS, Neg liver ROS,   Endo/Other  negative endocrine ROS  Renal/GU negative Renal ROS     Musculoskeletal negative musculoskeletal ROS (+)   Abdominal   Peds  Hematology negative hematology ROS (+)   Anesthesia Other Findings Day of surgery medications reviewed with the patient.  Reproductive/Obstetrics                           Anesthesia Physical Anesthesia Plan  ASA: II  Anesthesia Plan: Spinal   Post-op Pain Management:    Induction:   PONV Risk Score and Plan: 2 and Ondansetron and Propofol infusion  Airway Management Planned: Natural Airway  Additional Equipment:   Intra-op Plan:   Post-operative Plan:   Informed Consent: I have reviewed the patients History and Physical, chart, labs and discussed the procedure including the risks, benefits and alternatives for the proposed anesthesia with the patient or authorized representative who has indicated his/her understanding and acceptance.     Dental advisory given  Plan Discussed with: CRNA and Anesthesiologist  Anesthesia Plan Comments: (See PST note 07/12/2018, Konrad Felix, PA-C)      Anesthesia Quick Evaluation

## 2018-07-18 ENCOUNTER — Inpatient Hospital Stay (HOSPITAL_COMMUNITY): Payer: Medicare Other | Admitting: Anesthesiology

## 2018-07-18 ENCOUNTER — Observation Stay (HOSPITAL_COMMUNITY)
Admission: RE | Admit: 2018-07-18 | Discharge: 2018-07-19 | Disposition: A | Discharge status: Home / Self Care | Payer: Medicare Other | Source: Other Acute Inpatient Hospital | Attending: Orthopedic Surgery | Admitting: Orthopedic Surgery

## 2018-07-18 ENCOUNTER — Inpatient Hospital Stay (HOSPITAL_COMMUNITY): Payer: Medicare Other

## 2018-07-18 ENCOUNTER — Other Ambulatory Visit: Payer: Self-pay

## 2018-07-18 ENCOUNTER — Inpatient Hospital Stay (HOSPITAL_COMMUNITY): Payer: Medicare Other | Admitting: Physician Assistant

## 2018-07-18 ENCOUNTER — Encounter (HOSPITAL_COMMUNITY): Payer: Self-pay | Admitting: *Deleted

## 2018-07-18 ENCOUNTER — Encounter (HOSPITAL_COMMUNITY)
Admission: RE | Disposition: A | Discharge status: Home / Self Care | Payer: Self-pay | Source: Other Acute Inpatient Hospital | Attending: Orthopedic Surgery

## 2018-07-18 DIAGNOSIS — I10 Essential (primary) hypertension: Secondary | ICD-10-CM | POA: Insufficient documentation

## 2018-07-18 DIAGNOSIS — Z7982 Long term (current) use of aspirin: Secondary | ICD-10-CM | POA: Insufficient documentation

## 2018-07-18 DIAGNOSIS — Z09 Encounter for follow-up examination after completed treatment for conditions other than malignant neoplasm: Secondary | ICD-10-CM

## 2018-07-18 DIAGNOSIS — M1612 Unilateral primary osteoarthritis, left hip: Secondary | ICD-10-CM | POA: Diagnosis not present

## 2018-07-18 DIAGNOSIS — G4730 Sleep apnea, unspecified: Secondary | ICD-10-CM | POA: Diagnosis not present

## 2018-07-18 DIAGNOSIS — Z96642 Presence of left artificial hip joint: Secondary | ICD-10-CM | POA: Diagnosis not present

## 2018-07-18 DIAGNOSIS — F319 Bipolar disorder, unspecified: Secondary | ICD-10-CM | POA: Diagnosis not present

## 2018-07-18 DIAGNOSIS — E559 Vitamin D deficiency, unspecified: Secondary | ICD-10-CM | POA: Diagnosis not present

## 2018-07-18 DIAGNOSIS — Z79899 Other long term (current) drug therapy: Secondary | ICD-10-CM | POA: Insufficient documentation

## 2018-07-18 DIAGNOSIS — Z96649 Presence of unspecified artificial hip joint: Secondary | ICD-10-CM

## 2018-07-18 DIAGNOSIS — Z471 Aftercare following joint replacement surgery: Secondary | ICD-10-CM | POA: Diagnosis not present

## 2018-07-18 DIAGNOSIS — Z419 Encounter for procedure for purposes other than remedying health state, unspecified: Secondary | ICD-10-CM

## 2018-07-18 HISTORY — PX: TOTAL HIP ARTHROPLASTY: SHX124

## 2018-07-18 LAB — TYPE AND SCREEN
ABO/RH(D): O POS
Antibody Screen: NEGATIVE

## 2018-07-18 IMAGING — DX DG PORTABLE PELVIS
1 series · 1 of 1 positions shown · non-contrast
Comparison: Intraoperative radiographs [DATE]

CLINICAL DATA: Status post left total hip arthroplasty.

EXAM:
PORTABLE PELVIS 1-2 VIEWS

[pelvis ap]
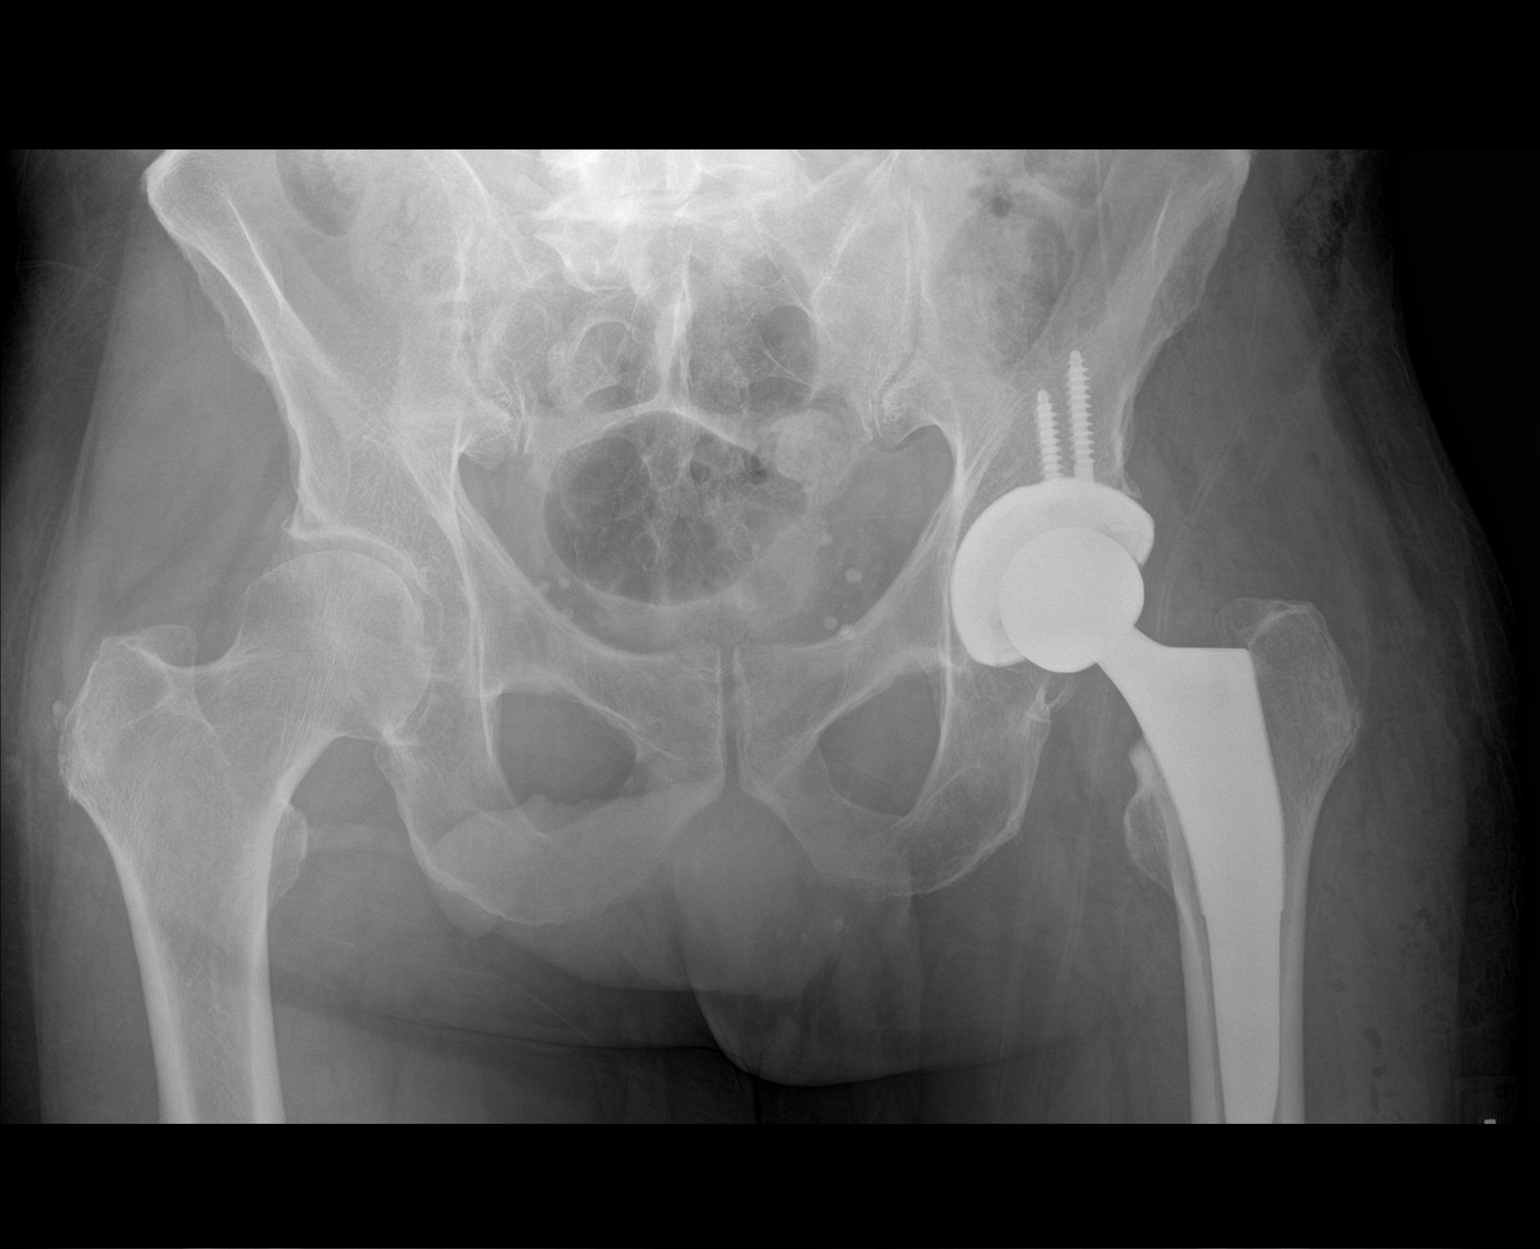

[1 of 1 positions shown; findings below may reference images not displayed]

FINDINGS: Single AP view of the pelvis demonstrates left total hip
arthroplasty. The hip is located. Components appear to be well
seated. No fractures are present. Urinary catheter is noted.
IMPRESSION: Single AP view of the pelvis demonstrates left total hip
arthroplasty without radiographic evidence for complication.

## 2018-07-18 SURGERY — ARTHROPLASTY, HIP, TOTAL, ANTERIOR APPROACH
Anesthesia: Spinal | Laterality: Left

## 2018-07-18 MED ORDER — FENTANYL CITRATE (PF) 100 MCG/2ML IJ SOLN: 25.0000 ug | INTRAMUSCULAR | Status: DC | PRN

## 2018-07-18 MED ORDER — SODIUM CHLORIDE 0.9 % IV SOLN
INTRAVENOUS | Status: DC
  Administered 2018-07-18 – 2018-07-19 (×2): via INTRAVENOUS

## 2018-07-18 MED ORDER — DEXAMETHASONE SODIUM PHOSPHATE 10 MG/ML IJ SOLN
INTRAMUSCULAR | Status: DC | PRN
  Administered 2018-07-18: 10 mg via INTRAVENOUS

## 2018-07-18 MED ORDER — POVIDONE-IODINE 10 % EX SWAB: 2.0000 "application " | Freq: Once | CUTANEOUS | Status: DC

## 2018-07-18 MED ORDER — PROPOFOL 10 MG/ML IV BOLUS
INTRAVENOUS | Status: AC
  Filled 2018-07-18: qty 20

## 2018-07-18 MED ORDER — FENTANYL CITRATE (PF) 100 MCG/2ML IJ SOLN
INTRAMUSCULAR | Status: DC | PRN
  Administered 2018-07-18: 100 ug via INTRAVENOUS

## 2018-07-18 MED ORDER — WATER FOR IRRIGATION, STERILE IR SOLN
Status: DC | PRN
  Administered 2018-07-18: 2000 mL

## 2018-07-18 MED ORDER — ONDANSETRON HCL 4 MG PO TABS: 4.0000 mg | ORAL_TABLET | Freq: Four times a day (QID) | ORAL | Status: DC | PRN

## 2018-07-18 MED ORDER — SENNA 8.6 MG PO TABS
1.0000 | ORAL_TABLET | Freq: Two times a day (BID) | ORAL | Status: DC
  Administered 2018-07-18 – 2018-07-19 (×2): 8.6 mg via ORAL
  Filled 2018-07-18 (×2): qty 1

## 2018-07-18 MED ORDER — FENTANYL CITRATE (PF) 100 MCG/2ML IJ SOLN
INTRAMUSCULAR | Status: AC
  Filled 2018-07-18: qty 2

## 2018-07-18 MED ORDER — PROMETHAZINE HCL 25 MG/ML IJ SOLN: 6.2500 mg | INTRAMUSCULAR | Status: DC | PRN

## 2018-07-18 MED ORDER — CEFAZOLIN SODIUM-DEXTROSE 2-4 GM/100ML-% IV SOLN
2.0000 g | Freq: Four times a day (QID) | INTRAVENOUS | Status: AC
  Administered 2018-07-18 – 2018-07-19 (×2): 2 g via INTRAVENOUS
  Filled 2018-07-18 (×2): qty 100

## 2018-07-18 MED ORDER — ONDANSETRON HCL 4 MG/2ML IJ SOLN
INTRAMUSCULAR | Status: DC | PRN
  Administered 2018-07-18: 4 mg via INTRAVENOUS

## 2018-07-18 MED ORDER — ONDANSETRON HCL 4 MG/2ML IJ SOLN
INTRAMUSCULAR | Status: AC
  Filled 2018-07-18: qty 2

## 2018-07-18 MED ORDER — MIDAZOLAM HCL 2 MG/2ML IJ SOLN
INTRAMUSCULAR | Status: AC
  Filled 2018-07-18: qty 2

## 2018-07-18 MED ORDER — ACETAMINOPHEN 10 MG/ML IV SOLN
1000.0000 mg | INTRAVENOUS | Status: AC
  Administered 2018-07-18: 1000 mg via INTRAVENOUS
  Filled 2018-07-18: qty 100

## 2018-07-18 MED ORDER — PHENOL 1.4 % MT LIQD
1.0000 | OROMUCOSAL | Status: DC | PRN
  Filled 2018-07-18: qty 177

## 2018-07-18 MED ORDER — BUPIVACAINE HCL (PF) 0.25 % IJ SOLN
INTRAMUSCULAR | Status: AC
  Filled 2018-07-18: qty 30

## 2018-07-18 MED ORDER — CHLORHEXIDINE GLUCONATE 4 % EX LIQD: 60.0000 mL | Freq: Once | CUTANEOUS | Status: DC

## 2018-07-18 MED ORDER — POLYETHYLENE GLYCOL 3350 17 G PO PACK: 17.0000 g | PACK | Freq: Every day | ORAL | Status: DC | PRN

## 2018-07-18 MED ORDER — ASPIRIN 81 MG PO CHEW
81.0000 mg | CHEWABLE_TABLET | Freq: Two times a day (BID) | ORAL | Status: DC
  Administered 2018-07-18 – 2018-07-19 (×2): 81 mg via ORAL
  Filled 2018-07-18 (×2): qty 1

## 2018-07-18 MED ORDER — SODIUM CHLORIDE 0.9 % IV SOLN
INTRAVENOUS | Status: DC | PRN
  Administered 2018-07-18: 25 ug/min via INTRAVENOUS

## 2018-07-18 MED ORDER — SODIUM CHLORIDE (PF) 0.9 % IJ SOLN
INTRAMUSCULAR | Status: DC | PRN
  Administered 2018-07-18: 30 mL

## 2018-07-18 MED ORDER — 0.9 % SODIUM CHLORIDE (POUR BTL) OPTIME
TOPICAL | Status: DC | PRN
  Administered 2018-07-18: 1000 mL

## 2018-07-18 MED ORDER — ACETAMINOPHEN 500 MG PO TABS: 1000.0000 mg | ORAL_TABLET | Freq: Once | ORAL | Status: DC

## 2018-07-18 MED ORDER — MIDAZOLAM HCL 5 MG/5ML IJ SOLN
INTRAMUSCULAR | Status: DC | PRN
  Administered 2018-07-18: 2 mg via INTRAVENOUS

## 2018-07-18 MED ORDER — BUPIVACAINE HCL (PF) 0.25 % IJ SOLN
INTRAMUSCULAR | Status: DC | PRN
  Administered 2018-07-18: 30 mL

## 2018-07-18 MED ORDER — ONDANSETRON HCL 4 MG/2ML IJ SOLN: 4.0000 mg | Freq: Four times a day (QID) | INTRAMUSCULAR | Status: DC | PRN

## 2018-07-18 MED ORDER — SODIUM CHLORIDE 0.9 % IR SOLN
Status: DC | PRN
  Administered 2018-07-18: 3000 mL
  Administered 2018-07-18: 1000 mL

## 2018-07-18 MED ORDER — MENTHOL 3 MG MT LOZG: 1.0000 | LOZENGE | OROMUCOSAL | Status: DC | PRN

## 2018-07-18 MED ORDER — SODIUM CHLORIDE (PF) 0.9 % IJ SOLN
INTRAMUSCULAR | Status: AC
  Filled 2018-07-18: qty 50

## 2018-07-18 MED ORDER — METOCLOPRAMIDE HCL 5 MG PO TABS: 5.0000 mg | ORAL_TABLET | Freq: Three times a day (TID) | ORAL | Status: DC | PRN

## 2018-07-18 MED ORDER — PROPOFOL 500 MG/50ML IV EMUL
INTRAVENOUS | Status: DC | PRN
  Administered 2018-07-18: 100 ug/kg/min via INTRAVENOUS

## 2018-07-18 MED ORDER — PHENYLEPHRINE HCL 10 MG/ML IJ SOLN
INTRAMUSCULAR | Status: AC
  Filled 2018-07-18: qty 1

## 2018-07-18 MED ORDER — TRANEXAMIC ACID-NACL 1000-0.7 MG/100ML-% IV SOLN
1000.0000 mg | INTRAVENOUS | Status: AC
  Administered 2018-07-18: 1000 mg via INTRAVENOUS

## 2018-07-18 MED ORDER — ISOPROPYL ALCOHOL 70 % SOLN
Status: DC | PRN
  Administered 2018-07-18: 1 via TOPICAL

## 2018-07-18 MED ORDER — DEXAMETHASONE SODIUM PHOSPHATE 10 MG/ML IJ SOLN
10.0000 mg | Freq: Once | INTRAMUSCULAR | Status: AC
  Administered 2018-07-19: 10 mg via INTRAVENOUS
  Filled 2018-07-18: qty 1

## 2018-07-18 MED ORDER — KETOROLAC TROMETHAMINE 30 MG/ML IJ SOLN
INTRAMUSCULAR | Status: DC | PRN
  Administered 2018-07-18: 30 mg via INTRAVENOUS

## 2018-07-18 MED ORDER — BISOPROLOL-HYDROCHLOROTHIAZIDE 5-6.25 MG PO TABS
1.0000 | ORAL_TABLET | Freq: Every day | ORAL | Status: DC
  Administered 2018-07-19: 1 via ORAL
  Filled 2018-07-18 (×2): qty 1

## 2018-07-18 MED ORDER — DOCUSATE SODIUM 100 MG PO CAPS
100.0000 mg | ORAL_CAPSULE | Freq: Two times a day (BID) | ORAL | Status: DC
  Administered 2018-07-18 – 2018-07-19 (×2): 100 mg via ORAL
  Filled 2018-07-18 (×2): qty 1

## 2018-07-18 MED ORDER — ALUM & MAG HYDROXIDE-SIMETH 200-200-20 MG/5ML PO SUSP: 30.0000 mL | ORAL | Status: DC | PRN

## 2018-07-18 MED ORDER — DEXAMETHASONE SODIUM PHOSPHATE 10 MG/ML IJ SOLN
INTRAMUSCULAR | Status: AC
  Filled 2018-07-18: qty 1

## 2018-07-18 MED ORDER — BUPIVACAINE IN DEXTROSE 0.75-8.25 % IT SOLN
INTRATHECAL | Status: DC | PRN
  Administered 2018-07-18: 2 mL via INTRATHECAL

## 2018-07-18 MED ORDER — ROCURONIUM BROMIDE 100 MG/10ML IV SOLN
INTRAVENOUS | Status: AC
  Filled 2018-07-18: qty 1

## 2018-07-18 MED ORDER — PROPOFOL 10 MG/ML IV BOLUS
INTRAVENOUS | Status: AC
  Filled 2018-07-18: qty 80

## 2018-07-18 MED ORDER — TRANEXAMIC ACID-NACL 1000-0.7 MG/100ML-% IV SOLN
INTRAVENOUS | Status: AC
  Filled 2018-07-18: qty 100

## 2018-07-18 MED ORDER — METHOCARBAMOL 500 MG IVPB - SIMPLE MED
500.0000 mg | Freq: Four times a day (QID) | INTRAVENOUS | Status: DC | PRN
  Filled 2018-07-18: qty 50

## 2018-07-18 MED ORDER — KETOROLAC TROMETHAMINE 30 MG/ML IJ SOLN
INTRAMUSCULAR | Status: AC
  Filled 2018-07-18: qty 1

## 2018-07-18 MED ORDER — LACTATED RINGERS IV SOLN
INTRAVENOUS | Status: DC
  Administered 2018-07-18 (×3): via INTRAVENOUS

## 2018-07-18 MED ORDER — LOPERAMIDE HCL 2 MG PO CAPS: 2.0000 mg | ORAL_CAPSULE | Freq: Every evening | ORAL | Status: DC | PRN

## 2018-07-18 MED ORDER — BISOPROLOL-HYDROCHLOROTHIAZIDE 5-6.25 MG PO TABS
1.0000 | ORAL_TABLET | Freq: Every day | ORAL | Status: DC
  Administered 2018-07-18: 1 via ORAL
  Filled 2018-07-18: qty 1

## 2018-07-18 MED ORDER — ISOPROPYL ALCOHOL 70 % SOLN
Status: AC
  Filled 2018-07-18: qty 480

## 2018-07-18 MED ORDER — DIVALPROEX SODIUM 250 MG PO DR TAB
500.0000 mg | DELAYED_RELEASE_TABLET | Freq: Two times a day (BID) | ORAL | Status: DC
  Administered 2018-07-18 – 2018-07-19 (×2): 500 mg via ORAL
  Filled 2018-07-18 (×2): qty 2

## 2018-07-18 MED ORDER — METOCLOPRAMIDE HCL 5 MG/ML IJ SOLN: 5.0000 mg | Freq: Three times a day (TID) | INTRAMUSCULAR | Status: DC | PRN

## 2018-07-18 MED ORDER — DIPHENHYDRAMINE HCL 12.5 MG/5ML PO ELIX: 12.5000 mg | ORAL_SOLUTION | ORAL | Status: DC | PRN

## 2018-07-18 MED ORDER — METHOCARBAMOL 500 MG PO TABS
500.0000 mg | ORAL_TABLET | Freq: Four times a day (QID) | ORAL | Status: DC | PRN
  Administered 2018-07-18: 500 mg via ORAL
  Filled 2018-07-18: qty 1

## 2018-07-18 MED ORDER — SODIUM CHLORIDE 0.9 % IV SOLN: INTRAVENOUS | Status: DC

## 2018-07-18 MED ORDER — CEFAZOLIN SODIUM-DEXTROSE 2-4 GM/100ML-% IV SOLN
2.0000 g | INTRAVENOUS | Status: AC
  Administered 2018-07-18: 2 g via INTRAVENOUS
  Filled 2018-07-18: qty 100

## 2018-07-18 SURGICAL SUPPLY — 58 items
BAG DECANTER FOR FLEXI CONT (MISCELLANEOUS) IMPLANT
BAG ZIPLOCK 12X15 (MISCELLANEOUS) IMPLANT
BLADE SURG SZ10 CARB STEEL (BLADE) ×4 IMPLANT
CHLORAPREP W/TINT 26ML (MISCELLANEOUS) ×2 IMPLANT
CLOTH BEACON ORANGE TIMEOUT ST (SAFETY) ×2 IMPLANT
COVER PERINEAL POST (MISCELLANEOUS) ×2 IMPLANT
COVER SURGICAL LIGHT HANDLE (MISCELLANEOUS) ×2 IMPLANT
COVER WAND RF STERILE (DRAPES) IMPLANT
CUP ACET PNNCL SECTR W/GRIP 56 (Hips) ×1 IMPLANT
DECANTER SPIKE VIAL GLASS SM (MISCELLANEOUS) ×2 IMPLANT
DERMABOND ADVANCED (GAUZE/BANDAGES/DRESSINGS) ×2
DERMABOND ADVANCED .7 DNX12 (GAUZE/BANDAGES/DRESSINGS) ×2 IMPLANT
DRAPE IMP U-DRAPE 54X76 (DRAPES) ×2 IMPLANT
DRAPE SHEET LG 3/4 BI-LAMINATE (DRAPES) ×6 IMPLANT
DRAPE STERI IOBAN 125X83 (DRAPES) ×2 IMPLANT
DRAPE TOP SHEET (DRAPES) ×2 IMPLANT
DRAPE U-SHAPE 47X51 STRL (DRAPES) ×4 IMPLANT
DRSG AQUACEL AG ADV 3.5X10 (GAUZE/BANDAGES/DRESSINGS) ×2 IMPLANT
ELECT PENCIL ROCKER SW 15FT (MISCELLANEOUS) ×2 IMPLANT
ELECT REM PT RETURN 15FT ADLT (MISCELLANEOUS) ×2 IMPLANT
GAUZE SPONGE 4X4 12PLY STRL (GAUZE/BANDAGES/DRESSINGS) ×2 IMPLANT
GLOVE BIO SURGEON STRL SZ8.5 (GLOVE) ×4 IMPLANT
GLOVE BIOGEL PI IND STRL 8.5 (GLOVE) ×1 IMPLANT
GLOVE BIOGEL PI INDICATOR 8.5 (GLOVE) ×1
GOWN SPEC L3 XXLG W/TWL (GOWN DISPOSABLE) ×2 IMPLANT
HANDPIECE INTERPULSE COAX TIP (DISPOSABLE) ×2
HEAD CERAMIC DELTA 36 PLUS 1.5 (Hips) ×2 IMPLANT
HOLDER FOLEY CATH W/STRAP (MISCELLANEOUS) ×2 IMPLANT
HOOD PEEL AWAY FLYTE STAYCOOL (MISCELLANEOUS) ×8 IMPLANT
MANIFOLD NEPTUNE II (INSTRUMENTS) ×2 IMPLANT
MARKER SKIN DUAL TIP RULER LAB (MISCELLANEOUS) ×2 IMPLANT
NDL SAFETY ECLIPSE 18X1.5 (NEEDLE) ×1 IMPLANT
NEEDLE HYPO 18GX1.5 SHARP (NEEDLE) ×1
NEEDLE SPNL 18GX3.5 QUINCKE PK (NEEDLE) ×2 IMPLANT
PACK ANTERIOR HIP CUSTOM (KITS) ×2 IMPLANT
PINN SECTOR W/GRIP ACE CUP 56 (Hips) ×2 IMPLANT
PINNACLE ALTRX PLUS 4 N 36X56 (Hips) ×2 IMPLANT
SAW OSC TIP CART 19.5X105X1.3 (SAW) ×2 IMPLANT
SCREW 6.5MMX30MM (Screw) ×2 IMPLANT
SCREW 6.5MMX35MM (Screw) ×2 IMPLANT
SEALER BIPOLAR AQUA 6.0 (INSTRUMENTS) ×2 IMPLANT
SET HNDPC FAN SPRY TIP SCT (DISPOSABLE) ×1 IMPLANT
STEM TRI LOC BPS SZ8 W GRIPTON ×1 IMPLANT
SUT ETHIBOND NAB CT1 #1 30IN (SUTURE) ×4 IMPLANT
SUT MNCRL AB 3-0 PS2 18 (SUTURE) ×2 IMPLANT
SUT MNCRL AB 4-0 PS2 18 (SUTURE) ×2 IMPLANT
SUT MON AB 2-0 CT1 36 (SUTURE) ×4 IMPLANT
SUT STRATAFIX PDO 1 14 VIOLET (SUTURE) ×2
SUT STRATFX PDO 1 14 VIOLET (SUTURE) ×1
SUT VIC AB 2-0 CT1 27 (SUTURE) ×1
SUT VIC AB 2-0 CT1 TAPERPNT 27 (SUTURE) ×1 IMPLANT
SUTURE STRATFX PDO 1 14 VIOLET (SUTURE) ×1 IMPLANT
SYR 3ML LL SCALE MARK (SYRINGE) ×4 IMPLANT
SYR 50ML LL SCALE MARK (SYRINGE) ×2 IMPLANT
TRAY FOLEY MTR SLVR 16FR STAT (SET/KITS/TRAYS/PACK) ×2 IMPLANT
TRI LOC BPS SZ8 W GRIPTON ×2 IMPLANT
WATER STERILE IRR 1000ML POUR (IV SOLUTION) ×2 IMPLANT
YANKAUER SUCT BULB TIP 10FT TU (MISCELLANEOUS) ×2 IMPLANT

## 2018-07-18 NOTE — Interval H&P Note (Signed)
History and Physical Interval Note:  07/18/2018 11:25 AM  Timothy [name]  has presented today for surgery, with the diagnosis of Degenerative joint disease left hip  The various methods of treatment have been discussed with the patient and family. After consideration of risks, benefits and other options for treatment, the patient has consented to  Procedure(s): TOTAL HIP ARTHROPLASTY ANTERIOR APPROACH (Left) as a surgical intervention .  The patient's history has been reviewed, patient examined, no change in status, stable for surgery.  I have reviewed the patient's chart and labs.  Questions were answered to the patient's satisfaction.     [name]

## 2018-07-18 NOTE — Transfer of Care (Signed)
Immediate Anesthesia Transfer of Care Note  Patient: Timothy [name]  Procedure(s) Performed: TOTAL HIP ARTHROPLASTY ANTERIOR APPROACH (Left )  Patient Location: PACU  Anesthesia Type:Spinal  Level of Consciousness: awake, alert , oriented and patient cooperative  Airway & Oxygen Therapy: Patient Spontanous Breathing and Patient connected to face mask oxygen  Post-op Assessment: Report given to RN and Post -op Vital signs reviewed and stable  Post vital signs: stable  Last Vitals:  Vitals Value Taken Time  BP 108/68 07/18/2018  3:09 PM  Temp    Pulse 64 07/18/2018  3:13 PM  Resp 17 07/18/2018  3:13 PM  SpO2 99 % 07/18/2018  3:13 PM  Vitals shown include unvalidated device data.  Last Pain:  Vitals:   07/18/18 0917  PainSc: 6       Patients Stated Pain Goal: 5 (23/36/12 2449)  Complications: No apparent anesthesia complications

## 2018-07-18 NOTE — Discharge Instructions (Signed)
°Dr. Dayle Mcnerney °Joint Replacement Specialist °[facility] °[address] °[place], [place] [address] °[phone] ° ° °TOTAL HIP REPLACEMENT POSTOPERATIVE DIRECTIONS ° ° ° °Hip Rehabilitation, Guidelines Following Surgery  ° °WEIGHT BEARING °Weight bearing as tolerated with assist device (walker, cane, etc) as directed, use it as long as suggested by your surgeon or therapist, typically at least 4-6 weeks. ° °The results of a hip operation are greatly improved after range of motion and muscle strengthening exercises. Follow all safety measures which are given to protect your hip. If any of these exercises cause increased pain or swelling in your joint, decrease the amount until you are comfortable again. Then slowly increase the exercises. Call your caregiver if you have problems or questions.  ° °HOME CARE INSTRUCTIONS  °Most of the following instructions are designed to prevent the dislocation of your new hip.  °Remove items at home which could result in a fall. This includes throw rugs or furniture in walking pathways.  °Continue medications as instructed at time of discharge. °· You may have some home medications which will be placed on hold until you complete the course of blood thinner medication. °· You may start showering once you are discharged home. Do not remove your dressing. °Do not put on socks or shoes without following the instructions of your caregivers.   °Sit on chairs with arms. Use the chair arms to help push yourself up when arising.  °Arrange for the use of a toilet seat elevator so you are not sitting low.  °· Walk with walker as instructed.  °You may resume a sexual relationship in one month or when given the OK by your caregiver.  °Use walker as long as suggested by your caregivers.  °You may put full weight on your legs and walk as much as is comfortable. °Avoid periods of inactivity such as sitting longer than an hour when not asleep. This helps prevent  blood clots.  °You may return to work once you are cleared by your surgeon.  °Do not drive a car for 6 weeks or until released by your surgeon.  °Do not drive while taking narcotics.  °Wear elastic stockings for two weeks following surgery during the day but you may remove then at night.  °Make sure you keep all of your appointments after your operation with all of your doctors and caregivers. You should call the office at the above phone number and make an appointment for approximately two weeks after the date of your surgery. °Please pick up a stool softener and laxative for home use as long as you are requiring pain medications. °· ICE to the affected hip every three hours for 30 minutes at a time and then as needed for pain and swelling. Continue to use ice on the hip for pain and swelling from surgery. You may notice swelling that will progress down to the foot and ankle.  This is normal after surgery.  Elevate the leg when you are not up walking on it.   °It is important for you to complete the blood thinner medication as prescribed by your doctor. °· Continue to use the breathing machine which will help keep your temperature down.  It is common for your temperature to cycle up and down following surgery, especially at night when you are not up moving around and exerting yourself.  The breathing machine keeps your lungs expanded and your temperature down. ° °RANGE OF MOTION AND STRENGTHENING EXERCISES  °These exercises are   designed to help you keep full movement of your hip joint. Follow your caregiver's or physical therapist's instructions. Perform all exercises about fifteen times, three times per day or as directed. Exercise both hips, even if you have had only one joint replacement. These exercises can be done on a training (exercise) mat, on the floor, on a table or on a bed. Use whatever works the best and is most comfortable for you. Use music or television while you are exercising so that the exercises  are a pleasant break in your day. This will make your life better with the exercises acting as a break in routine you can look forward to.  °Lying on your back, slowly slide your foot toward your buttocks, raising your knee up off the floor. Then slowly slide your foot back down until your leg is straight again.  °Lying on your back spread your legs as far apart as you can without causing discomfort.  °Lying on your side, raise your upper leg and foot straight up from the floor as far as is comfortable. Slowly lower the leg and repeat.  °Lying on your back, tighten up the muscle in the front of your thigh (quadriceps muscles). You can do this by keeping your leg straight and trying to raise your heel off the floor. This helps strengthen the largest muscle supporting your knee.  °Lying on your back, tighten up the muscles of your buttocks both with the legs straight and with the knee bent at a comfortable angle while keeping your heel on the floor.  ° °SKILLED REHAB INSTRUCTIONS: °If the patient is transferred to a skilled rehab facility following release from the hospital, a list of the current medications will be sent to the facility for the patient to continue.  When discharged from the skilled rehab facility, please have the facility set up the patient's Home Health Physical Therapy prior to being released. Also, the skilled facility will be responsible for providing the patient with their medications at time of release from the facility to include their pain medication and their blood thinner medication. If the patient is still at the rehab facility at time of the two week follow up appointment, the skilled rehab facility will also need to assist the patient in arranging follow up appointment in our office and any transportation needs. ° °MAKE SURE YOU:  °Understand these instructions.  °Will watch your condition.  °Will get help right away if you are not doing well or get worse. ° °Pick up stool softner and  laxative for home use following surgery while on pain medications. °Do not remove your dressing. °The dressing is waterproof--it is OK to take showers. °Continue to use ice for pain and swelling after surgery. °Do not use any lotions or creams on the incision until instructed by your surgeon. °Total Hip Protocol. ° ° °

## 2018-07-18 NOTE — Op Note (Signed)
OPERATIVE REPORT  SURGEON: Rod Can, MD   ASSISTANT: Sherlean Foot, RNFA.  PREOPERATIVE DIAGNOSIS: Left hip arthritis.   POSTOPERATIVE DIAGNOSIS: Left hip arthritis.   PROCEDURE: Left total hip arthroplasty, anterior approach.   IMPLANTS: DePuy Tri Lock stem, size 8, hi offset. DePuy Pinnacle Cup, size 56 mm. DePuy Altrx liner, size 36 by 56 mm, +4 neutral. DePuy Biolox ceramic head ball, size 36 + 1.5 mm. 6.5 mm cancellous bone screw x2.  ANESTHESIA:  MAC and Spinal  ESTIMATED BLOOD LOSS:-400 mL    ANTIBIOTICS: 2 g Ancef.  DRAINS: None.  COMPLICATIONS: None.   CONDITION: PACU - hemodynamically stable.   BRIEF CLINICAL NOTE: Timothy [name] is a 68 y.o. male with a long-standing history of Left hip arthritis. After failing conservative management, the patient was indicated for total hip arthroplasty. The risks, benefits, and alternatives to the procedure were explained, and the patient elected to proceed.  PROCEDURE IN DETAIL: Surgical site was marked by myself in the pre-op holding area. Once inside the operating room, spinal anesthesia was obtained, and a foley catheter was inserted. The patient was then positioned on the Hana table. All bony prominences were well padded. The hip was prepped and draped in the normal sterile surgical fashion. A time-out was called verifying side and site of surgery. The patient received IV antibiotics within 60 minutes of beginning the procedure.  The direct anterior approach to the hip was performed through the Hueter interval. Lateral femoral circumflex vessels were treated with the Auqumantys. The anterior capsule was exposed and an inverted T capsulotomy was made.The femoral neck cut was made to the level of the templated cut. A corkscrew was placed into the head and the head was removed. The femoral head was found to have eburnated bone. The head was passed to the back table and was measured.  Acetabular exposure was  achieved, and the pulvinar and labrum were excised. Sequential reaming of the acetabulum was then performed up to a size 55 mm reamer. A 56 mm cup was then opened and impacted into place at approximately 40 degrees of abduction and 20 degrees of anteversion. I elected to augment the already acceptable press fit fixation with 6.5 mm cancellous screws x2. The final polyethylene liner was impacted into place and acetabular osteophytes were removed.   I then gained femoral exposure taking care to protect the abductors and greater trochanter. This was performed using standard external rotation, extension, and adduction. The capsule was peeled off the inner aspect of the greater trochanter, taking care to preserve the short external rotators. A cookie cutter was used to enter the femoral canal, and then the femoral canal finder was placed. Sequential broaching was performed up to a size 8. Calcar planer was used on the femoral neck remnant. I placed a hi offset neck and a trial head ball. The hip was reduced. Leg lengths and offset were checked fluoroscopically. The hip was dislocated and trial components were removed. The final implants were placed, and the hip was reduced.  Fluoroscopy was used to confirm component position and leg lengths. At 90 degrees of external rotation and full extension, the hip was stable to an anterior directed force.  The wound was copiously irrigated with normal saline using pulse lavage. Marcaine solution was injected into the periarticular soft tissue. The wound was closed in layers using #1 Vicryl and V-Loc for the fascia, 2-0 Vicryl for the subcutaneous fat, 2-0 Monocryl for the deep dermal layer, 3-0 running Monocryl subcuticular stitch, and  Dermabond for the skin. Once the glue was fully dried, an Aquacell Ag dressing was applied. The patient was transported to the recovery room in stable condition. Sponge, needle, and instrument counts were correct at the end of  the case x2. The patient tolerated the procedure well and there were no known complications.  Please note that a surgical assistant was a medical necessity for this procedure to perform it in a safe and expeditious manner. Assistant was necessary to provide appropriate retraction of vital neurovascular structures, to prevent femoral fracture, and to allow for anatomic placement of the prosthesis.

## 2018-07-18 NOTE — Anesthesia Procedure Notes (Signed)
Spinal  Patient location during procedure: OR Start time: 07/18/2018 12:25 PM End time: 07/18/2018 12:35 PM Staffing Anesthesiologist: Myrtie Soman, MD Performed: anesthesiologist  Preanesthetic Checklist Completed: patient identified, site marked, surgical consent, pre-op evaluation, timeout performed, IV checked, risks and benefits discussed and monitors and equipment checked Spinal Block Patient position: sitting Prep: Betadine Patient monitoring: heart rate, continuous pulse ox and blood pressure Approach: midline Location: L3-4 Injection technique: single-shot Needle Needle type: Sprotte  Needle gauge: 24 G Needle length: 9 cm Additional Notes Expiration date of kit checked and confirmed. Patient tolerated procedure well, without complications.

## 2018-07-18 NOTE — Anesthesia Procedure Notes (Signed)
Procedure Name: MAC Date/Time: 07/18/2018 1:27 PM Performed by: Lissa Morales, CRNA Pre-anesthesia Checklist: Patient identified, Emergency Drugs available, Suction available, Patient being monitored and Timeout performed Patient Re-evaluated:Patient Re-evaluated prior to induction Oxygen Delivery Method: Simple face mask Placement Confirmation: positive ETCO2

## 2018-07-18 NOTE — Anesthesia Postprocedure Evaluation (Signed)
Anesthesia Post Note  Patient: Timothy [name]  Procedure(s) Performed: TOTAL HIP ARTHROPLASTY ANTERIOR APPROACH (Left )     Patient location during evaluation: PACU Anesthesia Type: Spinal Level of consciousness: awake and alert Pain management: pain level controlled Vital Signs Assessment: post-procedure vital signs reviewed and stable Respiratory status: spontaneous breathing, nonlabored ventilation, respiratory function stable and patient connected to nasal cannula oxygen Cardiovascular status: blood pressure returned to baseline and stable Postop Assessment: no apparent nausea or vomiting Anesthetic complications: no    Last Vitals:  Vitals:   07/18/18 1530 07/18/18 1545  BP: 109/72 110/72  Pulse: (!) 59 (!) 55  Resp: 12 10  Temp:  36.5 C  SpO2: 98% 97%    Last Pain:  Vitals:   07/18/18 1545  PainSc: 0-No pain                 Jaret Coppedge S

## 2018-07-19 ENCOUNTER — Encounter (HOSPITAL_COMMUNITY): Payer: Self-pay | Admitting: Orthopedic Surgery

## 2018-07-19 DIAGNOSIS — Z7982 Long term (current) use of aspirin: Secondary | ICD-10-CM | POA: Diagnosis not present

## 2018-07-19 DIAGNOSIS — Z79899 Other long term (current) drug therapy: Secondary | ICD-10-CM | POA: Diagnosis not present

## 2018-07-19 DIAGNOSIS — G4730 Sleep apnea, unspecified: Secondary | ICD-10-CM | POA: Diagnosis not present

## 2018-07-19 DIAGNOSIS — M1612 Unilateral primary osteoarthritis, left hip: Secondary | ICD-10-CM | POA: Diagnosis not present

## 2018-07-19 DIAGNOSIS — Z96649 Presence of unspecified artificial hip joint: Secondary | ICD-10-CM

## 2018-07-19 DIAGNOSIS — I10 Essential (primary) hypertension: Secondary | ICD-10-CM | POA: Diagnosis not present

## 2018-07-19 DIAGNOSIS — F319 Bipolar disorder, unspecified: Secondary | ICD-10-CM | POA: Diagnosis not present

## 2018-07-19 LAB — CBC
HCT: 36.3 % — ABNORMAL LOW (ref 39.0–52.0)
Hemoglobin: 11.5 g/dL — ABNORMAL LOW (ref 13.0–17.0)
MCH: 31.2 pg (ref 26.0–34.0)
MCHC: 31.7 g/dL (ref 30.0–36.0)
MCV: 98.4 fL (ref 80.0–100.0)
Platelets: 152 10*3/uL (ref 150–400)
RBC: 3.69 MIL/uL — ABNORMAL LOW (ref 4.22–5.81)
RDW: 12.6 % (ref 11.5–15.5)
WBC: 12.6 10*3/uL — ABNORMAL HIGH (ref 4.0–10.5)
nRBC: 0 % (ref 0.0–0.2)

## 2018-07-19 LAB — BASIC METABOLIC PANEL
Anion gap: 5 (ref 5–15)
BUN: 26 mg/dL — ABNORMAL HIGH (ref 8–23)
CO2: 26 mmol/L (ref 22–32)
Calcium: 8.2 mg/dL — ABNORMAL LOW (ref 8.9–10.3)
Chloride: 106 mmol/L (ref 98–111)
Creatinine, Ser: 0.84 mg/dL (ref 0.61–1.24)
GFR calc Af Amer: >60 mL/min (ref >60)
GFR calc non Af Amer: >60 mL/min (ref >60)
Glucose, Bld: 137 mg/dL — ABNORMAL HIGH (ref 70–99)
POTASSIUM: 4.2 mmol/L (ref 3.5–5.1)
Sodium: 137 mmol/L (ref 135–145)

## 2018-07-19 MED ORDER — ASPIRIN 81 MG PO CHEW: 81.0000 mg | CHEWABLE_TABLET | Freq: Two times a day (BID) | ORAL | 1 refills | Status: DC

## 2018-07-19 MED ORDER — DOCUSATE SODIUM 100 MG PO CAPS: 100.0000 mg | ORAL_CAPSULE | Freq: Two times a day (BID) | ORAL | 1 refills | Status: DC

## 2018-07-19 MED ORDER — ONDANSETRON HCL 4 MG PO TABS: 4.0000 mg | ORAL_TABLET | Freq: Four times a day (QID) | ORAL | 0 refills | Status: DC | PRN

## 2018-07-19 MED ORDER — SENNA 8.6 MG PO TABS: 2.0000 | ORAL_TABLET | Freq: Every day | ORAL | 1 refills | Status: DC

## 2018-07-19 MED ORDER — HYDROCODONE-ACETAMINOPHEN 5-325 MG PO TABS: 1.0000 | ORAL_TABLET | ORAL | 0 refills | Status: DC | PRN

## 2018-07-19 NOTE — Progress Notes (Signed)
Physical Therapy Treatment Patient Details Name: Dashiell Franchino MRN: [id number] DOB: 1951-02-23 Today's Date: 07/19/2018    History of Present Illness Pt s/p L THR and with hx of bipolar    PT Comments    Pt motivated and progressing well.  Pt and spouse reviewed lower body dressing, car transfers and stairs with written instruction provided.  Pt eager for dc home this date.   Follow Up Recommendations  Follow surgeon's recommendation for DC plan and follow-up therapies     Equipment Recommendations  Rolling walker with 5" wheels;3in1 (PT)    Recommendations for Other Services       Precautions / Restrictions Precautions Precautions: Fall Restrictions Weight Bearing Restrictions: No    Mobility  Bed Mobility               General bed mobility comments: Pt up in chair and requests back to same  Transfers Overall transfer level: Needs assistance Equipment used: Rolling walker (2 wheeled) Transfers: Sit to/from Stand Sit to Stand: Min guard;Supervision         General transfer comment: cues for LE management and use of UEs to self assist  Ambulation/Gait Ambulation/Gait assistance: Min guard;Supervision Gait Distance (Feet): 140 Feet Assistive device: Rolling walker (2 wheeled);Crutches Gait Pattern/deviations: Step-to pattern;Step-through pattern;Decreased step length - right;Decreased step length - left;Shuffle;Trunk flexed Gait velocity: decr   General Gait Details: cues for posture, position from RW and initial sequence; 100' with RW and 19' with crutches   Stairs Stairs: Yes Stairs assistance: Min assist Stair Management: No rails;One rail Left;Step to pattern;Forwards;With crutches Number of Stairs: 10 General stair comments: 5 stairs with bil crutches and 5 with crutch and rail; cues for sequence and foot/crutch placement; written instruction provided; spouse assisting in session   Wheelchair Mobility    Modified Rankin (Stroke Patients Only)       Balance Overall balance assessment: Mild deficits observed, not formally tested                                          Cognition Arousal/Alertness: Awake/alert Behavior During Therapy: WFL for tasks assessed/performed Overall Cognitive Status: Within Functional Limits for tasks assessed                                        Exercises Total Joint Exercises Ankle Circles/Pumps: AROM;Both;15 reps;Supine Quad Sets: AROM;Both;10 reps;Supine Heel Slides: AAROM;Left;20 reps;Supine Hip ABduction/ADduction: AAROM;Left;15 reps;Supine Long Arc Quad: AROM;Left;10 reps;Seated    General Comments        Pertinent Vitals/Pain Pain Assessment: 0-10 Pain Score: 3  Pain Location: L hip Pain Descriptors / Indicators: Aching;Sore Pain Intervention(s): Limited activity within patient's tolerance;Monitored during session;Premedicated before session    [place] expects to be discharged to:: Private residence Living Arrangements: Spouse/significant other Available Help at Discharge: Family Type of Home: House Home Access: Stairs to enter Entrance Stairs-Rails: None;Left Home Layout: Two level Home Equipment: Environmental consultant - 2 wheels;Bedside commode;Cane - single point      Prior Function Level of Independence: Independent          PT Goals (current goals can now be found in the care plan section) Acute Rehab PT Goals Patient Stated Goal: Regain IND PT Goal Formulation: With patient Time For Goal Achievement: 07/26/18 Potential to Achieve Goals: Good  Progress towards PT goals: Progressing toward goals    Frequency    7X/week      PT Plan Current plan remains appropriate    Co-evaluation              AM-PAC PT "6 Clicks" Mobility   Outcome Measure  Help needed turning from your back to your side while in a flat bed without using bedrails?: A Little Help needed moving from lying on your back to sitting on the side  of a flat bed without using bedrails?: A Little Help needed moving to and from a bed to a chair (including a wheelchair)?: A Little Help needed standing up from a chair using your arms (e.g., wheelchair or bedside chair)?: A Little Help needed to walk in hospital room?: A Little Help needed climbing 3-5 steps with a railing? : A Little 6 Click Score: 18    End of Session Equipment Utilized During Treatment: Gait belt Activity Tolerance: Patient tolerated treatment well Patient left: in chair;with call bell/phone within reach;with family/visitor present Nurse Communication: Mobility status PT Visit Diagnosis: Difficulty in walking, not elsewhere classified (R26.2)     Time: 4801-6553 PT Time Calculation (min) (ACUTE ONLY): 32 min  Charges:  $Gait Training: 8-22 mins $Therapeutic Activity: 8-22 mins                     [place] Pager [phone] Office [phone]    Hashem Goynes 07/19/2018, 1:44 PM

## 2018-07-19 NOTE — Plan of Care (Signed)
  Problem: Education: Goal: Knowledge of the prescribed therapeutic regimen will improve Outcome: Progressing   Problem: Activity: Goal: Ability to tolerate increased activity will improve Outcome: Progressing   Problem: Education: Goal: Knowledge of General Education information will improve Description Including pain rating scale, medication(s)/side effects and non-pharmacologic comfort measures Outcome: Progressing   Problem: Health Behavior/Discharge Planning: Goal: Ability to manage health-related needs will improve Outcome: Progressing   Problem: Activity: Goal: Risk for activity intolerance will decrease Outcome: Progressing   Problem: Pain Managment: Goal: General experience of comfort will improve Outcome: Progressing

## 2018-07-19 NOTE — Progress Notes (Signed)
    Subjective:  Patient reports pain as mild to moderate.  Denies N/V/CP/SOB. No c/o.  Objective:   VITALS:   Vitals:   07/18/18 2215 07/19/18 0208 07/19/18 0456 07/19/18 0957  BP: 105/64 105/72 104/65 127/74  Pulse: 67 60 (!) 58 74  Resp: 14 14 14 16   Temp: 97.8 F (36.6 C) 97.8 F (36.6 C) (!) 97.4 F (36.3 C) 98.2 F (36.8 C)  TempSrc:    Oral  SpO2: 98% 100% 100% 96%  Weight:      Height:        NAD ABD soft Sensation intact distally Intact pulses distally Dorsiflexion/Plantar flexion intact Incision: dressing C/D/I Compartment soft   Lab Results  Component Value Date   WBC 12.6 (H) 07/19/2018   HGB 11.5 (L) 07/19/2018   HCT 36.3 (L) 07/19/2018   MCV 98.4 07/19/2018   PLT 152 07/19/2018   BMET    Component Value Date/Time   NA 137 07/19/2018 0548   K 4.2 07/19/2018 0548   CL 106 07/19/2018 0548   CO2 26 07/19/2018 0548   GLUCOSE 137 (H) 07/19/2018 0548   BUN 26 (H) 07/19/2018 0548   CREATININE 0.84 07/19/2018 0548   CREATININE 0.79 05/21/2018 1500   CALCIUM 8.2 (L) 07/19/2018 0548   GFRNONAA >60 07/19/2018 0548   GFRNONAA 93 05/21/2018 1500   GFRAA >60 07/19/2018 0548   GFRAA 108 05/21/2018 1500     Assessment/Plan: 1 Day Post-Op   Principal Problem:   Osteoarthritis of left hip   WBAT with walker DVT ppx: Aspirin, SCDs, TEDS PO pain control PT/OT Dispo: D/C home with HEP   Hilton Cork Makensey Rego 07/19/2018, 10:22 AM   Rod Can, MD Cell: [phone] [place] [facility] now [facility]  [place] [address] [place], [place], [place] [address] Phone: [phone] [email or web address] Facebook  [employer]

## 2018-07-19 NOTE — Discharge Summary (Signed)
Physician Discharge Summary  Patient ID: Timothy [name] MRN: [id number] DOB/AGE: 02/18/1951 68 y.o.  Admit date: 07/18/2018 Discharge date: 07/19/2018  Admission Diagnoses:  Osteoarthritis of left hip  Discharge Diagnoses:  Principal Problem:   Osteoarthritis of left hip   Past Medical History:  Diagnosis Date  . Arthritis   . Bipolar disorder ([place])    controlled by meds since 1997  . Pneumonia   . Sleep apnea    cpap  . Wears glasses    reading    Surgeries: Procedure(s): TOTAL HIP ARTHROPLASTY ANTERIOR APPROACH on 07/18/2018   Consultants (if any):   Discharged Condition: Improved  Hospital Course: [name] is an 68 y.o. male who was admitted 07/18/2018 with a diagnosis of Osteoarthritis of left hip and went to the operating room on 07/18/2018 and underwent the above named procedures.    He was given perioperative antibiotics:  Anti-infectives (From admission, onward)   Start     Dose/Rate Route Frequency Ordered Stop   07/18/18 1830  ceFAZolin (ANCEF) IVPB 2g/100 mL premix     2 g 200 mL/hr over 30 Minutes Intravenous Every 6 hours 07/18/18 1623 07/19/18 0037   07/18/18 0900  ceFAZolin (ANCEF) IVPB 2g/100 mL premix     2 g 200 mL/hr over 30 Minutes Intravenous On call to O.R. 07/18/18 5885 07/18/18 1248    .  He was given sequential compression devices, early ambulation, and ASA for DVT prophylaxis.  He benefited maximally from the hospital stay and there were no complications.    Recent vital signs:  Vitals:   07/19/18 0456 07/19/18 0957  BP: 104/65 127/74  Pulse: (!) 58 74  Resp: 14 16  Temp: (!) 97.4 F (36.3 C) 98.2 F (36.8 C)  SpO2: 100% 96%    Recent laboratory studies:  Lab Results  Component Value Date   HGB 11.5 (L) 07/19/2018   HGB 15.3 07/12/2018   HGB 14.7 05/21/2018   Lab Results  Component Value Date   WBC 12.6 (H) 07/19/2018   PLT 152 07/19/2018   No results found for: INR Lab Results  Component Value Date   NA 137  07/19/2018   K 4.2 07/19/2018   CL 106 07/19/2018   CO2 26 07/19/2018   BUN 26 (H) 07/19/2018   CREATININE 0.84 07/19/2018   GLUCOSE 137 (H) 07/19/2018    Discharge Medications:   Allergies as of 07/19/2018      Reactions   Ciprofloxacin Diarrhea      Medication List    STOP taking these medications   aspirin EC 81 MG tablet Replaced by:  aspirin 81 MG chewable tablet     TAKE these medications   aspirin 81 MG chewable tablet Chew 1 tablet (81 mg total) by mouth 2 (two) times daily. Replaces:  aspirin EC 81 MG tablet   bisoprolol-hydrochlorothiazide 5-6.25 MG tablet Commonly known as:  ZIAC TAKE 1 TABLET BY MOUTH EVERY DAY FOR BLOOD PRESSURE. What changed:  See the new instructions.   divalproex 250 MG DR tablet Commonly known as:  DEPAKOTE Take 500 mg by mouth 2 (two) times daily.   docusate sodium 100 MG capsule Commonly known as:  COLACE Take 1 capsule (100 mg total) by mouth 2 (two) times daily.   HYDROcodone-acetaminophen 5-325 MG tablet Commonly known as:  NORCO Take 1 tablet by mouth every 4 (four) hours as needed for moderate pain.   loperamide 2 MG tablet Commonly known as:  IMODIUM A-D Take 2 mg by mouth  at bedtime as needed for diarrhea or loose stools.   naproxen sodium 220 MG tablet Commonly known as:  ALEVE Take 440 mg by mouth 2 (two) times daily as needed (pain.).   ondansetron 4 MG tablet Commonly known as:  ZOFRAN Take 1 tablet (4 mg total) by mouth every 6 (six) hours as needed for nausea.   senna 8.6 MG Tabs tablet Commonly known as:  SENOKOT Take 2 tablets (17.2 mg total) by mouth at bedtime.   Vitamin D-3 125 MCG (5000 UT) Tabs Take 10,000 Units by mouth every evening.       Diagnostic Studies: Dg Pelvis Portable  Result Date: 07/18/2018 CLINICAL DATA:  Status post left total hip arthroplasty. EXAM: PORTABLE PELVIS 1-2 VIEWS COMPARISON:  Intraoperative radiographs 07/18/2018 FINDINGS: Single AP view of the pelvis demonstrates  left total hip arthroplasty. The hip is located. Components appear to be well seated. No fractures are present. Urinary catheter is noted. IMPRESSION: Single AP view of the pelvis demonstrates left total hip arthroplasty without radiographic evidence for complication. Electronically Signed   By: Timothy [name] M.D.   On: 07/18/2018 15:33   Dg C-arm 1-60 Min-no Report  Result Date: 07/18/2018 Fluoroscopy was utilized by the requesting physician.  No radiographic interpretation.   Dg Hip Operative Unilat W Or W/o Pelvis Left  Result Date: 07/18/2018 CLINICAL DATA:  68 y/o M; intraoperative left anterior hip replacement. EXAM: OPERATIVE LEFT HIP (WITH PELVIS IF PERFORMED)  VIEWS TECHNIQUE: Fluoroscopic spot image(s) were submitted for interpretation post-operatively. COMPARISON:  None. FINDINGS: Thirteen intraoperative fluoroscopic images of left hip replacement. Fluoro time is 49 seconds. IMPRESSION: Left hip replacement intraoperative fluoroscopy. Electronically Signed   By: Timothy [name] M.D.   On: 07/18/2018 14:45    Disposition: Discharge disposition: 01-Home or Self Care       Discharge Instructions    Call MD / Call 911   Complete by:  As directed    If you experience chest pain or shortness of breath, CALL 911 and be transported to the hospital emergency room.  If you develope a fever above 101 F, pus (white drainage) or increased drainage or redness at the wound, or calf pain, call your surgeon's office.   Constipation Prevention   Complete by:  As directed    Drink plenty of fluids.  Prune juice may be helpful.  You may use a stool softener, such as Colace (over the counter) 100 mg twice a day.  Use MiraLax (over the counter) for constipation as needed.   Diet - low sodium heart healthy   Complete by:  As directed    Driving restrictions   Complete by:  As directed    No driving for 4 weeks   Increase activity slowly as tolerated   Complete by:  As directed     Lifting restrictions   Complete by:  As directed    No lifting for 6 weeks   TED hose   Complete by:  As directed    Use stockings (TED hose) for 2 weeks on both leg(s).  You may remove them at night for sleeping.      Follow-up Information    Rod Can, MD. Go on 07/31/2018.   Specialty:  Orthopedic Surgery Why:  You are scheduled for a post-operative appointment on 07-31-2018 at 8:45 am. Contact information: [address] [place] [address] [phone]            Signed: Hilton Cork Ragan Reale 07/19/2018, 10:26 AM

## 2018-07-19 NOTE — Care Management Obs Status (Signed)
MEDICARE OBSERVATION STATUS NOTIFICATION   Patient Details  Name: Timothy [name] MRN: [id number] Date of Birth: 09-04-1950   Medicare Observation Status Notification Given:  Yes    Leeroy Cha, RN 07/19/2018, 11:44 AM

## 2018-07-19 NOTE — Evaluation (Signed)
Physical Therapy Evaluation Patient Details Name: Timothy [name] MRN: [id number] DOB: 12/03/1950 Today's Date: 07/19/2018   History of Present Illness  Pt s/p L THR and with hx of bipolar  Clinical Impression  Pt s/p L THR and presents with decreased L LE strength/ROM and post op pain limiting functional mobility.  Pt should progress to dc home with family assist.  Pt initiated home therex program and written instruction provided.    Follow Up Recommendations Follow surgeon's recommendation for DC plan and follow-up therapies    Equipment Recommendations  Rolling walker with 5" wheels;3in1 (PT)    Recommendations for Other Services       Precautions / Restrictions Precautions Precautions: Fall Restrictions Weight Bearing Restrictions: No      Mobility  Bed Mobility               General bed mobility comments: Pt up on side of bed - states minimal difficulty   Transfers Overall transfer level: Needs assistance   Transfers: Sit to/from Stand Sit to Stand: Min assist         General transfer comment: cues for LE management and use of UEs to self assist  Ambulation/Gait Ambulation/Gait assistance: Min assist;Min guard Gait Distance (Feet): 120 Feet Assistive device: Rolling walker (2 wheeled) Gait Pattern/deviations: Step-to pattern;Step-through pattern;Decreased step length - right;Decreased step length - left;Shuffle;Trunk flexed Gait velocity: decr   General Gait Details: cues for posture, position from RW and initial sequence  Stairs            Wheelchair Mobility    Modified Rankin (Stroke Patients Only)       Balance Overall balance assessment: Mild deficits observed, not formally tested                                           Pertinent Vitals/Pain Pain Assessment: 0-10 Pain Score: 4  Pain Location: L hip Pain Descriptors / Indicators: Aching;Sore Pain Intervention(s): Limited activity within patient's  tolerance;Monitored during session;Premedicated before session;Ice applied    Home Living Family/patient expects to be discharged to:: Private residence Living Arrangements: Spouse/significant other Available Help at Discharge: Family Type of Home: House Home Access: Stairs to enter Entrance Stairs-Rails: Adult nurse of Steps: 6+6 Home Layout: Two level Home Equipment: Environmental consultant - 2 wheels;Bedside commode;Cane - single point      Prior Function Level of Independence: Independent               Hand Dominance        Extremity/Trunk Assessment   Upper Extremity Assessment Upper Extremity Assessment: Overall WFL for tasks assessed    Lower Extremity Assessment Lower Extremity Assessment: LLE deficits/detail LLE Deficits / Details: 2+/5 strength at hip with AAROM at hip to 90 flex and 25 abd    Cervical / Trunk Assessment Cervical / Trunk Assessment: Normal  Communication   Communication: No difficulties  Cognition Arousal/Alertness: Awake/alert Behavior During Therapy: WFL for tasks assessed/performed Overall Cognitive Status: Within Functional Limits for tasks assessed                                        General Comments      Exercises Total Joint Exercises Ankle Circles/Pumps: AROM;Both;15 reps;Supine Quad Sets: AROM;Both;10 reps;Supine Heel Slides: AAROM;Left;20 reps;Supine Hip ABduction/ADduction: AAROM;Left;15 reps;Supine Long  Arc Quad: AROM;Left;10 reps;Seated   Assessment/Plan    PT Assessment Patient needs continued PT services  PT Problem List Decreased strength;Decreased range of motion;Decreased activity tolerance;Decreased balance;Decreased mobility;Decreased knowledge of use of DME;Pain       PT Treatment Interventions DME instruction;Gait training;Stair training;Functional mobility training;Therapeutic activities;Therapeutic exercise;Patient/family education    PT Goals (Current goals can be found in  the Care Plan section)  Acute Rehab PT Goals Patient Stated Goal: Regain IND PT Goal Formulation: With patient Time For Goal Achievement: 07/26/18 Potential to Achieve Goals: Good    Frequency 7X/week   Barriers to discharge        Co-evaluation               AM-PAC PT "6 Clicks" Mobility  Outcome Measure Help needed turning from your back to your side while in a flat bed without using bedrails?: A Little Help needed moving from lying on your back to sitting on the side of a flat bed without using bedrails?: A Little Help needed moving to and from a bed to a chair (including a wheelchair)?: A Little Help needed standing up from a chair using your arms (e.g., wheelchair or bedside chair)?: A Little Help needed to walk in hospital room?: A Little Help needed climbing 3-5 steps with a railing? : A Little 6 Click Score: 18    End of Session Equipment Utilized During Treatment: Gait belt Activity Tolerance: Patient tolerated treatment well Patient left: in chair;with call bell/phone within reach;with family/visitor present Nurse Communication: Mobility status PT Visit Diagnosis: Difficulty in walking, not elsewhere classified (R26.2)    Time: 6440-3474 PT Time Calculation (min) (ACUTE ONLY): 30 min   Charges:   PT Evaluation $PT Eval Low Complexity: 1 Low PT Treatments $Gait Training: 8-22 mins        [place] Pager [phone] Office [phone]   Valena Ivanov 07/19/2018, 1:28 PM

## 2018-07-19 NOTE — Care Management CC44 (Signed)
Condition Code 44 Documentation Completed  Patient Details  Name: Nyal Schachter MRN: [id number] Date of Birth: [birth year]   Condition Code 44 given:  Yes Patient signature on Condition Code 44 notice:  Yes Documentation of 2 MD's agreement:  Yes Code 44 added to claim:  Yes    Leeroy Cha, RN 07/19/2018, 11:44 AM

## 2018-07-19 NOTE — Care Plan (Signed)
Ortho Bundle Case Management Note  Patient Details  Name: Timothy [name] MRN: [id number] Date of Birth: [birth year]  L THA 07-18-2018 DCP:  Home with fiance'.  2 story home with 10 ste. DME:  RW ordered through [facility].  Doesn't need/want a 3-in-1. PT:  HEP                   DME Arranged:  Walker rolling DME Agency:  [facility]  HH Arranged:  NA HH Agency:  NA  Additional Comments: Please contact me with any questions of if this plan should need to change.  Marianne Sofia, RN,CCM [facility]  [phone] 07/19/2018, 8:22 AM

## 2018-07-19 NOTE — Care Management Note (Signed)
Case Management Note  Patient Details  Name: Timothy [name] MRN: [id number] Date of Birth: 07/24/1950  Subjective/Objective:                  Discharged to home  Action/Plan: [facility]_HEP DMe-rolling walker and 3 in 1 through [facility].  Expected Discharge Date:  07/19/18               Expected Discharge Plan:  Home/Self Care  In-House Referral:     Discharge planning Services  CM Consult  Post Acute Care Choice:  Durable Medical Equipment Choice offered to:     DME Arranged:  Walker rolling DME Agency:  [facility]  HH Arranged:  NA HH Agency:  NA  Status of Service:  Completed, signed off  If discussed at [place] of Stay Meetings, dates discussed:    Additional Comments:  Leeroy Cha, RN 07/19/2018, 11:05 AM

## 2018-07-31 DIAGNOSIS — Z96642 Presence of left artificial hip joint: Secondary | ICD-10-CM | POA: Diagnosis not present

## 2018-07-31 DIAGNOSIS — Z471 Aftercare following joint replacement surgery: Secondary | ICD-10-CM | POA: Diagnosis not present

## 2018-09-04 ENCOUNTER — Other Ambulatory Visit: Payer: Self-pay | Admitting: Internal Medicine

## 2018-09-04 DIAGNOSIS — Z96642 Presence of left artificial hip joint: Secondary | ICD-10-CM | POA: Diagnosis not present

## 2018-09-04 DIAGNOSIS — I10 Essential (primary) hypertension: Secondary | ICD-10-CM

## 2018-09-04 DIAGNOSIS — Z471 Aftercare following joint replacement surgery: Secondary | ICD-10-CM | POA: Diagnosis not present

## 2018-10-10 DIAGNOSIS — D225 Melanocytic nevi of trunk: Secondary | ICD-10-CM | POA: Diagnosis not present

## 2018-10-10 DIAGNOSIS — Z87898 Personal history of other specified conditions: Secondary | ICD-10-CM | POA: Diagnosis not present

## 2018-10-10 DIAGNOSIS — L570 Actinic keratosis: Secondary | ICD-10-CM | POA: Diagnosis not present

## 2018-10-10 DIAGNOSIS — Z85828 Personal history of other malignant neoplasm of skin: Secondary | ICD-10-CM | POA: Diagnosis not present

## 2018-10-10 DIAGNOSIS — C44612 Basal cell carcinoma of skin of right upper limb, including shoulder: Secondary | ICD-10-CM | POA: Diagnosis not present

## 2018-10-10 DIAGNOSIS — D485 Neoplasm of uncertain behavior of skin: Secondary | ICD-10-CM | POA: Diagnosis not present

## 2018-10-10 DIAGNOSIS — L821 Other seborrheic keratosis: Secondary | ICD-10-CM | POA: Diagnosis not present

## 2018-10-31 ENCOUNTER — Other Ambulatory Visit: Payer: Self-pay

## 2018-10-31 MED ORDER — DIVALPROEX SODIUM 250 MG PO DR TAB: 500.0000 mg | DELAYED_RELEASE_TABLET | Freq: Two times a day (BID) | ORAL | 0 refills | Status: DC

## 2018-11-05 ENCOUNTER — Other Ambulatory Visit: Payer: Self-pay

## 2018-11-05 ENCOUNTER — Ambulatory Visit (INDEPENDENT_AMBULATORY_CARE_PROVIDER_SITE_OTHER): Payer: Medicare Other | Admitting: Psychiatry

## 2018-11-05 ENCOUNTER — Encounter: Payer: Self-pay | Admitting: Psychiatry

## 2018-11-05 DIAGNOSIS — F319 Bipolar disorder, unspecified: Secondary | ICD-10-CM

## 2018-11-05 NOTE — Progress Notes (Signed)
Timothy [name] [id number] [birth year] 68 y.o.  Subjective:   Patient ID:  Timothy [name] is a 68 y.o. (DOB 04-May-1951) male.  Chief Complaint:  Chief Complaint  Patient presents with  . Follow-up    Medication Management  . Depression    Medication Management    HPI Timothy [name] presents to the office today for follow-up of bipolar disorder and has been on Depakote DR 500 mg twice daily for many years.  Last seen June 2019.  Typically seen yearly in recent years because of long-term stability  Health good.  Sleep 6 1/2 hours with CPAP.  Alert daytime.  Mood stable and good.  Patient reports stable mood and denies depressed or irritable moods.  Patient denies any recent difficulty with anxiety.  Patient denies difficulty with sleep initiation or maintenance. Denies appetite disturbance.  Patient reports that energy and motivation have been good.  Patient denies any difficulty with concentration.  Patient denies any suicidal ideation.   Living with fiance [name] with plans to marry and Covid interfered.   Past Psychiatric Medication Trials: Depakote 500 mg twice daily, paroxetine 20, Ambien, ProSom, lorazepam, Effexor He has been on Depakote alone at 7 52,000 mg daily since 1998  Review of Systems:  Review of Systems  Musculoskeletal: Negative for back pain and gait problem.  Neurological: Negative for tremors and weakness.  Hip replacement July.2 CPAP since Mid July. Going well.  Medications: I have reviewed the patient's current medications.  Current Outpatient Medications  Medication Sig Dispense Refill  . aspirin 81 MG chewable tablet Chew 1 tablet (81 mg total) by mouth 2 (two) times daily. 60 tablet 1  . bisoprolol-hydrochlorothiazide (ZIAC) 5-6.25 MG tablet TAKE 1 TABLET BY MOUTH EVERY DAY FOR BLOOD PRESSURE. 90 tablet 1  . Cholecalciferol (VITAMIN D-3) 125 MCG (5000 UT) TABS Take 10,000 Units by mouth every evening.    . divalproex (DEPAKOTE) 250 MG DR tablet Take 2  tablets (500 mg total) by mouth 2 (two) times daily. 360 tablet 0  . loperamide (IMODIUM A-D) 2 MG tablet Take 2 mg by mouth at bedtime as needed for diarrhea or loose stools.     . naproxen sodium (ALEVE) 220 MG tablet Take 440 mg by mouth 2 (two) times daily as needed (pain.).     No current facility-administered medications for this visit.     Medication Side Effects: None  Allergies:  Allergies  Allergen Reactions  . Ciprofloxacin Diarrhea    Past Medical History:  Diagnosis Date  . Arthritis   . Bipolar disorder ([place])    controlled by meds since 1997  . Pneumonia   . Sleep apnea    cpap  . Wears glasses    reading    History reviewed. No pertinent family history.  Social History   Socioeconomic History  . Marital status: Legally Separated    Spouse name: Not on file  . Number of children: Not on file  . Years of education: Not on file  . Highest education level: Not on file  Occupational History  . Not on file  Social Needs  . Financial resource strain: Not on file  . Food insecurity:    Worry: Not on file    Inability: Not on file  . Transportation needs:    Medical: Not on file    Non-medical: Not on file  Tobacco Use  . Smoking status: Never Smoker  . Smokeless tobacco: Never Used  Substance and Sexual Activity  . Alcohol use: Yes  Comment: 5 days a week  . Drug use: No  . Sexual activity: Yes  Lifestyle  . Physical activity:    Days per week: Not on file    Minutes per session: Not on file  . Stress: Not on file  Relationships  . Social connections:    Talks on phone: Not on file    Gets together: Not on file    Attends religious service: Not on file    Active member of club or organization: Not on file    Attends meetings of clubs or organizations: Not on file    Relationship status: Not on file  . Intimate partner violence:    Fear of current or ex partner: Not on file    Emotionally abused: Not on file    Physically abused: Not on  file    Forced sexual activity: Not on file  Other Topics Concern  . Not on file  Social History Narrative  . Not on file    Past Medical History, Surgical history, Social history, and Family history were reviewed and updated as appropriate.   Please see review of systems for further details on the patient's review from today.   Objective:   Physical Exam:  There were no vitals taken for this visit.  Physical Exam Constitutional:      General: He is not in acute distress.    Appearance: He is well-developed.  Musculoskeletal:        General: No deformity.  Neurological:     Mental Status: He is alert and oriented to person, place, and time.     Coordination: Coordination normal.  Psychiatric:        Attention and Perception: Attention normal. He is attentive. He does not perceive auditory hallucinations.        Mood and Affect: Mood normal. Mood is not anxious or depressed. Affect is not labile, blunt, angry or inappropriate.        Speech: Speech normal.        Behavior: Behavior normal.        Thought Content: Thought content normal. Thought content does not include homicidal or suicidal ideation. Thought content does not include homicidal or suicidal plan.        Cognition and Memory: Cognition normal.        Judgment: Judgment normal.     Comments: Insight is good.     Lab Review:     Component Value Date/Time   NA 137 07/19/2018 0548   K 4.2 07/19/2018 0548   CL 106 07/19/2018 0548   CO2 26 07/19/2018 0548   GLUCOSE 137 (H) 07/19/2018 0548   BUN 26 (H) 07/19/2018 0548   CREATININE 0.84 07/19/2018 0548   CREATININE 0.79 05/21/2018 1500   CALCIUM 8.2 (L) 07/19/2018 0548   PROT 6.8 05/21/2018 1500   ALBUMIN 4.1 09/01/2016 1519   AST 15 05/21/2018 1500   ALT 16 05/21/2018 1500   ALKPHOS 45 09/01/2016 1519   BILITOT 0.4 05/21/2018 1500   GFRNONAA >60 07/19/2018 0548   GFRNONAA 93 05/21/2018 1500   GFRAA >60 07/19/2018 0548   GFRAA 108 05/21/2018 1500        Component Value Date/Time   WBC 12.6 (H) 07/19/2018 0548   RBC 3.69 (L) 07/19/2018 0548   HGB 11.5 (L) 07/19/2018 0548   HCT 36.3 (L) 07/19/2018 0548   PLT 152 07/19/2018 0548   MCV 98.4 07/19/2018 0548   MCH 31.2 07/19/2018 0548   MCHC 31.7  07/19/2018 0548   RDW 12.6 07/19/2018 0548   LYMPHSABS 2,690 05/21/2018 1500   MONOABS 721 07/21/2016 1630   EOSABS 129 05/21/2018 1500   BASOSABS 30 05/21/2018 1500    No results found for: POCLITH, LITHIUM   Lab Results  Component Value Date   VALPROATE 44.0 (L) 05/21/2018     .res Assessment: Plan:    Bipolar I disorder ([place])   No change indicated.  Good response.  Labs per PCP yearly - good as noted above incl LFT's and CBC and VPA usually low normal and working.  FU 1 year.  Lynder Parents, MD, DFAPA  Please see After Visit Summary for patient specific instructions.  Future Appointments  Date Time Provider [place]  11/08/2018  8:30 AM Ward Givens, NP [facility] None  11/22/2018  2:00 PM Liane Comber, NP [facility] None  07/03/2019  3:00 PM [name], MD [facility] None    No orders of the defined types were placed in this encounter.     -------------------------------

## 2018-11-06 ENCOUNTER — Telehealth: Payer: Self-pay | Admitting: *Deleted

## 2018-11-06 NOTE — Telephone Encounter (Signed)
Due to current COVID 19 pandemic, our office is severely reducing in office visits until further notice, in order to minimize the risk to our patients and healthcare providers. He did not want to do a VV.  Made in office appt.

## 2018-11-08 ENCOUNTER — Other Ambulatory Visit: Payer: Self-pay

## 2018-11-08 ENCOUNTER — Encounter: Payer: Self-pay | Admitting: Adult Health

## 2018-11-08 ENCOUNTER — Ambulatory Visit (INDEPENDENT_AMBULATORY_CARE_PROVIDER_SITE_OTHER): Payer: Medicare Other | Admitting: Adult Health

## 2018-11-08 VITALS — BP 129/75 | HR 61 | Temp 98.6°F | Ht 69.0 in | Wt 187.0 lb

## 2018-11-08 DIAGNOSIS — Z9989 Dependence on other enabling machines and devices: Secondary | ICD-10-CM | POA: Diagnosis not present

## 2018-11-08 DIAGNOSIS — G4733 Obstructive sleep apnea (adult) (pediatric): Secondary | ICD-10-CM | POA: Diagnosis not present

## 2018-11-08 NOTE — Progress Notes (Signed)
PATIENT: Arrow Tomko DOB: April 13, 1951  REASON FOR VISIT: follow up HISTORY FROM: patient  HISTORY OF PRESENT ILLNESS: Today 11/08/18:  Mr. Mosco is a 68 year old male with a history of obstructive sleep apnea on CPAP.  He returns today for follow-up.  His CPAP download indicates that he uses machine 29 out of 30 days for compliance of 97%.  He uses machine greater than 4 hours 26 out of 30 days for compliance of 87%.  On average he uses his machine 5 hours and 36 minutes.  His residual AHI is 4 on 5 to 15 cm of water with EPR of 2.  His leak in the 95th percentile is 21.9 L/min.  He reports that the CPAP continues to work well for him.  He has order new supplies which may explain his leak.  He joins me today for follow-up.  HISTORY 03/08/18: Mr. Cerullo is a 68 year old male with a history of obstructive sleep apnea on CPAP.  He returns today for his first CPAP download.  His download indicates that he uses machine 29 out of 30 days for compliance of 97%.  He uses machine greater than 4 hours 26 out of 30 days for compliance of 87%.  On average he uses his machine 5 hours and 21 minutes.  His residual AHI is 4.7 on 5 to 15 cm of water with EPR of 2.  His pressure in the 95th percentile is 9 cm of water.  He reports that he has seen a benefit from using the CPAP machine.  He returns today for evaluation.   REVIEW OF SYSTEMS: Out of a complete 14 system review of symptoms, the patient complains only of the following symptoms, and all other reviewed systems are negative.  See HPI  ALLERGIES: Allergies  Allergen Reactions   Ciprofloxacin Diarrhea    HOME MEDICATIONS: Outpatient Medications Prior to Visit  Medication Sig Dispense Refill   aspirin 81 MG chewable tablet Chew 1 tablet (81 mg total) by mouth 2 (two) times daily. 60 tablet 1   bisoprolol-hydrochlorothiazide (ZIAC) 5-6.25 MG tablet TAKE 1 TABLET BY MOUTH EVERY DAY FOR BLOOD PRESSURE. 90 tablet 1   Cholecalciferol (VITAMIN  D-3) 125 MCG (5000 UT) TABS Take 10,000 Units by mouth every evening.     divalproex (DEPAKOTE) 250 MG DR tablet Take 2 tablets (500 mg total) by mouth 2 (two) times daily. 360 tablet 0   loperamide (IMODIUM A-D) 2 MG tablet Take 2 mg by mouth at bedtime as needed for diarrhea or loose stools.      naproxen sodium (ALEVE) 220 MG tablet Take 440 mg by mouth 2 (two) times daily as needed (pain.).     No facility-administered medications prior to visit.     PAST MEDICAL HISTORY: Past Medical History:  Diagnosis Date   Arthritis    Bipolar disorder ([place])    controlled by meds since 1997   Pneumonia    Sleep apnea    cpap   Wears glasses    reading    PAST SURGICAL HISTORY: Past Surgical History:  Procedure Laterality Date   APPENDECTOMY     COLONOSCOPY     KNEE ARTHROSCOPY W/ MENISCAL REPAIR  2004   left   ORIF HUMERUS FRACTURE Left 09/19/2013   Procedure: LEFT OPEN REDUCTION INTERNAL FIXATION (ORIF) PROXIMAL HUMERUS; ROTATOR CUFF REPAIR;  Surgeon: [name], MD;  Location: [facility];  Service: Orthopedics;  Laterality: Left;   TONSILLECTOMY     TOTAL  HIP ARTHROPLASTY Left 07/18/2018   Procedure: TOTAL HIP ARTHROPLASTY ANTERIOR APPROACH;  Surgeon: Rod Can, MD;  Location: [facility];  Service: Orthopedics;  Laterality: Left;    FAMILY HISTORY: No family history on file.  SOCIAL HISTORY: Social History   Socioeconomic History   Marital status: Legally Separated    Spouse name: Not on file   Number of children: Not on file   Years of education: Not on file   Highest education level: Not on file  Occupational History   Not on file  Social Needs   Financial resource strain: Not on file   Food insecurity    Worry: Not on file    Inability: Not on file   Transportation needs    Medical: Not on file    Non-medical: Not on file  Tobacco Use   Smoking status: Never Smoker   Smokeless tobacco: Never Used  Substance and  Sexual Activity   Alcohol use: Yes    Comment: 5 days a week   Drug use: No   Sexual activity: Yes  Lifestyle   Physical activity    Days per week: Not on file    Minutes per session: Not on file   Stress: Not on file  Relationships   Social connections    Talks on phone: Not on file    Gets together: Not on file    Attends religious service: Not on file    Active member of club or organization: Not on file    Attends meetings of clubs or organizations: Not on file    Relationship status: Not on file   Intimate partner violence    Fear of current or ex partner: Not on file    Emotionally abused: Not on file    Physically abused: Not on file    Forced sexual activity: Not on file  Other Topics Concern   Not on file  Social History Narrative   Not on file      PHYSICAL EXAM  Vitals:   11/08/18 0820  BP: 129/75  Pulse: 61  Temp: 98.6 F (37 C)  Weight: 187 lb (84.8 kg)  Height: 5\' 9"  (1.753 m)   Body mass index is 27.62 kg/m.  Generalized: Well developed, in no acute distress  Chest: Lungs clear to auscultation bilaterally  Neurological examination  Mentation: Alert oriented to time, place, history taking. Follows all commands speech and language fluent Cranial nerve II-XII: Pupils were equal round reactive to light. Extraocular movements were full, visual field were full on confrontational test. Facial sensation and strength were normal. Uvula tongue midline. Head turning and shoulder shrug  were normal and symmetric. Motor: The motor testing reveals 5 over 5 strength of all 4 extremities. Good symmetric motor tone is noted throughout.  Sensory: Sensory testing is intact to soft touch on all 4 extremities. No evidence of extinction is noted.  Coordination: Cerebellar testing reveals good finger-nose-finger and heel-to-shin bilaterally.  Gait and station: Gait is normal.    DIAGNOSTIC DATA (LABS, IMAGING, TESTING) - I reviewed patient records, labs,  notes, testing and imaging myself where available.  Lab Results  Component Value Date   WBC 12.6 (H) 07/19/2018   HGB 11.5 (L) 07/19/2018   HCT 36.3 (L) 07/19/2018   MCV 98.4 07/19/2018   PLT 152 07/19/2018      Component Value Date/Time   NA 137 07/19/2018 0548   K 4.2 07/19/2018 0548   CL 106 07/19/2018 0548   CO2 26 07/19/2018  0548   GLUCOSE 137 (H) 07/19/2018 0548   BUN 26 (H) 07/19/2018 0548   CREATININE 0.84 07/19/2018 0548   CREATININE 0.79 05/21/2018 1500   CALCIUM 8.2 (L) 07/19/2018 0548   PROT 6.8 05/21/2018 1500   ALBUMIN 4.1 09/01/2016 1519   AST 15 05/21/2018 1500   ALT 16 05/21/2018 1500   ALKPHOS 45 09/01/2016 1519   BILITOT 0.4 05/21/2018 1500   GFRNONAA >60 07/19/2018 0548   GFRNONAA 93 05/21/2018 1500   GFRAA >60 07/19/2018 0548   GFRAA 108 05/21/2018 1500   Lab Results  Component Value Date   CHOL 163 05/21/2018   HDL 59 05/21/2018   LDLCALC 80 05/21/2018   TRIG 139 05/21/2018   CHOLHDL 2.8 05/21/2018   Lab Results  Component Value Date   HGBA1C 5.2 05/21/2018   No results found for: [id number] Lab Results  Component Value Date   TSH 0.60 05/21/2018      ASSESSMENT AND PLAN 68 y.o. year old male  has a past medical history of Arthritis, Bipolar disorder ([place]), Pneumonia, Sleep apnea, and Wears glasses. here with :  1.  Obstructive sleep apnea on CPAP  The patient CPAP download shows excellent compliance and good treatment of his apnea.  He is encouraged to continue using CPAP nightly and greater than 4 hours each night.  He is advised that if his symptoms worsen or he develops new symptoms he should let us know.  He will follow-up in 6 months or sooner if needed.   I spent 15 minutes with the patient. 50% of this time was spent reviewing the patient CPAP download   Ward Givens, MSN, NP-C 11/08/2018, 8:39 AM [facility] [address], [place], [place] [address] [phone]

## 2018-11-21 NOTE — Progress Notes (Signed)
MEDICARE ANNUAL WELLNESS VISIT AND FOLLOW UP Assessment:   Diagnoses and all orders for this visit:  Encounter for Medicare annual wellness exam  Essential hypertension Continue medication Monitor blood pressure at home; call if consistently over 130/80 Continue DASH diet.   Reminder to go to the ER if any CP, SOB, nausea, dizziness, severe HA, changes vision/speech, left arm numbness and tingling and jaw pain.  Vitamin D deficiency Continue supplementation; goal is 60-100;  Check vitamin D level  OSA on CPAP Reports 100% compliance; no concerns; follows with Dr. [name]  Other abnormal glucose Recent A1Cs at goal Discussed diet/exercise, weight management  Defer A1C; check CMP  Medication management CBC, CMP/GFR  Depression, unspecified depression type Continue follow up with Dr. Clovis Pu Check valproic acid levels PRN only; has been stable for many years  BMI 27,adult Continue to recommend diet heavy in fruits and veggies and low in animal meats, cheeses, and dairy products, appropriate calorie intake Discuss exercise recommendations routinely Continue to monitor weight at each visit  Need for hep C screening - hepatitis C antibody  Need for pneumococcal vaccine - 23 valent administered without complication today    Over 30 minutes of exam, counseling, chart review, and critical decision making was performed  Future Appointments  Date Time Provider [place]  07/03/2019  3:00 PM [name], MD [facility] None  11/14/2019  9:30 AM Ward Givens, NP [facility] None     Plan:   During the course of the visit the patient was educated and counseled about appropriate screening and preventive services including:    Pneumococcal vaccine   Influenza vaccine  Prevnar 13  Td vaccine  Screening electrocardiogram  Colorectal cancer screening  Diabetes screening  Glaucoma screening  Nutrition counseling    Subjective:  Timothy [name] is a 68  y.o. male who presents for Medicare Annual Wellness Visit and 6 month follow up for HTN, hyperlipidemia, glucose management, and vitamin D Def.   Patient  followed by Dr Clovis Pu for Depression & is on Depakote. Patient had a colonoscopy (May 2017) with a benign polyp per Dr Amedeo Plenty, 5 year follow up recommended.   He has OSA on CPAP since 11/2017; no issues, endorses 100% compliance and endorses restorative sleep. Followed by Dr. Beacher May.  He had L hip LHA by Dr. Lyla Glassing in 06/2018 and has done well since.   BMI is Body mass index is 27.85 kg/m., he has been working on diet and exercise. Wt Readings from Last 3 Encounters:  11/22/18 188 lb 9.6 oz (85.5 kg)  11/08/18 187 lb (84.8 kg)  07/18/18 181 lb (82.1 kg)   His blood pressure has been controlled at home, today their BP is BP: 120/68 He does workout. He denies chest pain, shortness of breath, dizziness.   He is not on cholesterol medication and denies myalgias. His cholesterol is at goal. The cholesterol last visit was:   Lab Results  Component Value Date   CHOL 163 05/21/2018   HDL 59 05/21/2018   LDLCALC 80 05/21/2018   TRIG 139 05/21/2018   CHOLHDL 2.8 05/21/2018   He has been working on diet and exercise for glucose management, and denies foot ulcerations, increased appetite, nausea, paresthesia of the feet, polydipsia, polyuria, visual disturbances, vomiting and weight loss. Last A1C in the office was:  Lab Results  Component Value Date   HGBA1C 5.2 05/21/2018   Last GFR Lab Results  Component Value Date   GFRNONAA >60 07/19/2018    Patient is on  Vitamin D supplement, has reduced dose from 15000 IU to 10000 IU    Lab Results  Component Value Date   VD25OH 121 (H) 05/21/2018      Medication Review:   Current Outpatient Medications (Cardiovascular):  .  bisoprolol-hydrochlorothiazide (ZIAC) 5-6.25 MG tablet, TAKE 1 TABLET BY MOUTH EVERY DAY FOR BLOOD PRESSURE.   Current Outpatient Medications (Analgesics):  .   aspirin 81 MG chewable tablet, Chew 1 tablet (81 mg total) by mouth 2 (two) times daily. (Patient taking differently: Chew 81 mg by mouth daily. ) .  naproxen sodium (ALEVE) 220 MG tablet, Take 440 mg by mouth 2 (two) times daily as needed (pain.).   Current Outpatient Medications (Other):  Marland Kitchen  Cholecalciferol (VITAMIN D-3) 125 MCG (5000 UT) TABS, Take 10,000 Units by mouth every evening. .  divalproex (DEPAKOTE) 250 MG DR tablet, Take 2 tablets (500 mg total) by mouth 2 (two) times daily. Marland Kitchen  loperamide (IMODIUM A-D) 2 MG tablet, Take 2 mg by mouth at bedtime as needed for diarrhea or loose stools.   Allergies: Allergies  Allergen Reactions  . Ciprofloxacin Diarrhea    Current Problems (verified) has HTN (hypertension); Vitamin D deficiency; Other abnormal glucose; Diarrhea; Medication management; Depression; Tubular adenoma of colon; S/P total hip arthroplasty; and OSA on CPAP on their problem list.  Screening Tests Immunization History  Administered Date(s) Administered  . Pneumococcal Conjugate-13 04/05/2017  . Td 11/06/2017   Preventative care: Last colonoscopy: May/2017 - benign polyp - Dr. Amedeo Plenty -5 year follow up  Prior vaccinations: TD or Tdap: 2019 Influenza: 04/2018  Pneumococcal: Due 1 year from [place]: 2018 Shingles/Zostavax: had at previous practice  Names of Other Physician/Practitioners you currently use: 1. [facility] here for primary care 2. Dr. ?, eye doctor, last visit 2019, no issues, readers  3. Dr. Orvil Feil, dentist, last visit 2019, goes annually, no issues  4. Dermatology specialists, annually, last in 09/2018  Patient Care Team: [name], MD as PCP - General (Internal Medicine) Cottle, Billey Co., MD as Attending Physician (Psychiatry)  Surgical: He  has a past surgical history that includes Tonsillectomy; Appendectomy; Colonoscopy; Knee arthroscopy w/ meniscal repair (2004); ORIF humerus  fracture (Left, 09/19/2013); and Total hip arthroplasty (Left, 07/18/2018). Family His family history is not on file. Social history  He reports that he has never smoked. He has never used smokeless tobacco. He reports current alcohol use. He reports that he does not use drugs.  MEDICARE WELLNESS OBJECTIVES: Physical activity: Current Exercise Habits: Home exercise routine, Type of exercise: strength training/weights;Other - see comments(swimming), Time (Minutes): 60, Frequency (Times/Week): 4, Weekly Exercise (Minutes/Week): 240, Intensity: Mild, Exercise limited by: None identified Cardiac risk factors: Cardiac Risk Factors include: advanced age (>32men, >27 women);male gender;hypertension Depression/mood screen:   Depression screen [facility] 2/9 11/22/2018  Decreased Interest 0  Down, Depressed, Hopeless 0  PHQ - 2 Score 0    ADLs:  In your present state of health, do you have any difficulty performing the following activities: 11/22/2018 07/18/2018  Hearing? N N  Vision? N N  Difficulty concentrating or making decisions? N N  Walking or climbing stairs? N N  Dressing or bathing? N N  Doing errands, shopping? N N  Some recent data might be hidden     Cognitive Testing  Alert? Yes  Normal Appearance?Yes  Oriented to person? Yes  Place? Yes   Time? Yes  Recall of three objects?  Yes  Can perform simple calculations?  Yes  Displays appropriate judgment?Yes  Can read the correct time from a watch face?Yes  EOL planning: Does Patient Have a Medical Advance Directive?: Yes Type of Advance Directive: Healthcare Power of Attorney, Living will Does patient want to make changes to medical advance directive?: No - Patient declined Copy of [place] in Chart?: Yes - validated most recent copy scanned in chart (See row information)   Objective:   Today's Vitals   11/22/18 1353  BP: 120/68  Pulse: 65  Temp: (!) 97.5 F (36.4 C)  SpO2: 96%  Weight: 188 lb 9.6 oz (85.5 kg)   Height: 5\' 9"  (1.753 m)   Body mass index is 27.85 kg/m.  General appearance: alert, no distress, WD/WN, male HEENT: normocephalic, sclerae anicteric, TMs pearly, nares patent, no discharge or erythema, pharynx normal Oral cavity: MMM, no lesions Neck: supple, no lymphadenopathy, no thyromegaly, no masses Heart: RRR, normal S1, S2, no murmurs Lungs: CTA bilaterally, no wheezes, rhonchi, or rales Abdomen: +bs, soft, non tender, non distended, no masses, no hepatomegaly, no splenomegaly Musculoskeletal: nontender, no swelling, no obvious deformity Extremities: no edema, no cyanosis, no clubbing Pulses: 2+ symmetric, upper and lower extremities, normal cap refill Neurological: alert, oriented x 3, CN2-12 intact, strength normal upper extremities and lower extremities, sensation normal throughout, DTRs 2+ throughout, no cerebellar signs, gait normal Psychiatric: normal affect, behavior normal, pleasant   Medicare Attestation I have personally reviewed: The patient's medical and social history Their use of alcohol, tobacco or illicit drugs Their current medications and supplements The patient's functional ability including ADLs,fall risks, home safety risks, cognitive, and hearing and visual impairment Diet and physical activities Evidence for depression or mood disorders  The patient's weight, height, BMI, and visual acuity have been recorded in the chart.  I have made referrals, counseling, and provided education to the patient based on review of the above and I have provided the patient with a written personalized care plan for preventive services.     Izora Ribas, NP   11/22/2018

## 2018-11-22 ENCOUNTER — Ambulatory Visit (INDEPENDENT_AMBULATORY_CARE_PROVIDER_SITE_OTHER): Payer: Medicare Other | Admitting: Adult Health

## 2018-11-22 ENCOUNTER — Other Ambulatory Visit: Payer: Self-pay

## 2018-11-22 ENCOUNTER — Encounter: Payer: Self-pay | Admitting: Adult Health

## 2018-11-22 VITALS — BP 120/68 | HR 65 | Temp 97.5°F | Ht 69.0 in | Wt 188.6 lb

## 2018-11-22 DIAGNOSIS — R6889 Other general symptoms and signs: Secondary | ICD-10-CM | POA: Diagnosis not present

## 2018-11-22 DIAGNOSIS — D126 Benign neoplasm of colon, unspecified: Secondary | ICD-10-CM

## 2018-11-22 DIAGNOSIS — Z6827 Body mass index (BMI) 27.0-27.9, adult: Secondary | ICD-10-CM | POA: Diagnosis not present

## 2018-11-22 DIAGNOSIS — R197 Diarrhea, unspecified: Secondary | ICD-10-CM | POA: Diagnosis not present

## 2018-11-22 DIAGNOSIS — G4733 Obstructive sleep apnea (adult) (pediatric): Secondary | ICD-10-CM

## 2018-11-22 DIAGNOSIS — R0683 Snoring: Secondary | ICD-10-CM | POA: Diagnosis not present

## 2018-11-22 DIAGNOSIS — Z0000 Encounter for general adult medical examination without abnormal findings: Secondary | ICD-10-CM

## 2018-11-22 DIAGNOSIS — F329 Major depressive disorder, single episode, unspecified: Secondary | ICD-10-CM

## 2018-11-22 DIAGNOSIS — Z79899 Other long term (current) drug therapy: Secondary | ICD-10-CM

## 2018-11-22 DIAGNOSIS — M1612 Unilateral primary osteoarthritis, left hip: Secondary | ICD-10-CM

## 2018-11-22 DIAGNOSIS — E559 Vitamin D deficiency, unspecified: Secondary | ICD-10-CM

## 2018-11-22 DIAGNOSIS — I10 Essential (primary) hypertension: Secondary | ICD-10-CM

## 2018-11-22 DIAGNOSIS — Z1159 Encounter for screening for other viral diseases: Secondary | ICD-10-CM

## 2018-11-22 DIAGNOSIS — Z96649 Presence of unspecified artificial hip joint: Secondary | ICD-10-CM

## 2018-11-22 DIAGNOSIS — F32A Depression, unspecified: Secondary | ICD-10-CM

## 2018-11-22 DIAGNOSIS — Z0001 Encounter for general adult medical examination with abnormal findings: Secondary | ICD-10-CM

## 2018-11-22 DIAGNOSIS — R7309 Other abnormal glucose: Secondary | ICD-10-CM

## 2018-11-22 DIAGNOSIS — Z23 Encounter for immunization: Secondary | ICD-10-CM | POA: Diagnosis not present

## 2018-11-22 NOTE — Patient Instructions (Addendum)
Mr. Klemann , Thank you for taking time to come for your Medicare Wellness Visit. I appreciate your ongoing commitment to your health goals. Please review the following plan we discussed and let me know if I can assist you in the future.   This is a list of the screening recommended for you and due dates:  Health Maintenance  Topic Date Due  .  Hepatitis C: One time screening is recommended by Center for Disease Control  (CDC) for  adults born from [birth year] through 1965.   04-Apr-1951  . Pneumonia vaccines (2 of 2 - PPSV23) 04/05/2018  . Colon Cancer Screening  10/29/2020  . Tetanus Vaccine  11/07/2027  . Flu Shot  Discontinued    Try calling [phone] to set up covid 19 testing    Pneumococcal Vaccine, Polyvalent solution for injection What is this medicine? PNEUMOCOCCAL VACCINE, POLYVALENT (NEU mo KOK al vak SEEN, pol ee VEY luhnt) is a vaccine to prevent pneumococcus bacteria infection. These bacteria are a major cause of ear infections, Strep throat infections, and serious pneumonia, meningitis, or blood infections worldwide. These vaccines help the body to produce antibodies (protective substances) that help your body defend against these bacteria. This vaccine is recommended for people 68 years of age and older with health problems. It is also recommended for all adults over 68 years old. This vaccine will not treat an infection. This medicine may be used for other purposes; ask your health care provider or pharmacist if you have questions. COMMON BRAND NAME(S): Pneumovax 23 What should I tell my health care provider before I take this medicine? They need to know if you have any of these conditions: -bleeding problems -bone marrow or organ transplant -cancer, Hodgkin's disease -fever -infection -immune system problems -low platelet count in the blood -seizures -an unusual or allergic reaction to pneumococcal vaccine, diphtheria toxoid, other vaccines, latex, other medicines, foods,  dyes, or preservatives -pregnant or trying to get pregnant -breast-feeding How should I use this medicine? This vaccine is for injection into a muscle or under the skin. It is given by a health care professional. A copy of Vaccine Information Statements will be given before each vaccination. Read this sheet carefully each time. The sheet may change frequently. Talk to your pediatrician regarding the use of this medicine in children. While this drug may be prescribed for children as young as 68 years of age for selected conditions, precautions do apply. Overdosage: If you think you have taken too much of this medicine contact a poison control center or emergency room at once. NOTE: This medicine is only for you. Do not share this medicine with others. What if I miss a dose? It is important not to miss your dose. Call your doctor or health care professional if you are unable to keep an appointment. What may interact with this medicine? -medicines for cancer chemotherapy -medicines that suppress your immune function -medicines that treat or prevent blood clots like warfarin, enoxaparin, and dalteparin -steroid medicines like prednisone or cortisone This list may not describe all possible interactions. Give your health care provider a list of all the medicines, herbs, non-prescription drugs, or dietary supplements you use. Also tell them if you smoke, drink alcohol, or use illegal drugs. Some items may interact with your medicine. What should I watch for while using this medicine? Mild fever and pain should go away in 3 days or less. Report any unusual symptoms to your doctor or health care professional. What side effects may I  notice from receiving this medicine? Side effects that you should report to your doctor or health care professional as soon as possible: -allergic reactions like skin rash, itching or hives, swelling of the face, lips, or tongue -breathing problems -confused -fever over 102  degrees F -pain, tingling, numbness in the hands or feet -seizures -unusual bleeding or bruising -unusual muscle weakness Side effects that usually do not require medical attention (report to your doctor or health care professional if they continue or are bothersome): -aches and pains -diarrhea -fever of 102 degrees F or less -headache -irritable -loss of appetite -pain, tender at site where injected -trouble sleeping This list may not describe all possible side effects. Call your doctor for medical advice about side effects. You may report side effects to FDA at 1-800-FDA-1088. Where should I keep my medicine? This does not apply. This vaccine is given in a clinic, pharmacy, doctor's office, or other health care setting and will not be stored at home. NOTE: This sheet is a summary. It may not cover all possible information. If you have questions about this medicine, talk to your doctor, pharmacist, or health care provider.  2019 Elsevier/Gold Standard (2007-12-21 14:32:37)

## 2018-11-23 LAB — CBC WITH DIFFERENTIAL/PLATELET
Absolute Monocytes: 856 cells/uL (ref 200–950)
Basophils Absolute: 37 cells/uL (ref 0–200)
Basophils Relative: 0.4 %
Eosinophils Absolute: 140 cells/uL (ref 15–500)
Eosinophils Relative: 1.5 %
HCT: 44.3 % (ref 38.5–50.0)
Hemoglobin: 14.9 g/dL (ref 13.2–17.1)
Lymphs Abs: 3255 cells/uL (ref 850–3900)
MCH: 30.2 pg (ref 27.0–33.0)
MCHC: 33.6 g/dL (ref 32.0–36.0)
MCV: 89.9 fL (ref 80.0–100.0)
MPV: 11.8 fL (ref 7.5–12.5)
Monocytes Relative: 9.2 %
Neutro Abs: 5013 cells/uL (ref 1500–7800)
Neutrophils Relative %: 53.9 %
Platelets: 191 10*3/uL (ref 140–400)
RBC: 4.93 10*6/uL (ref 4.20–5.80)
RDW: 13.4 % (ref 11.0–15.0)
Total Lymphocyte: 35 %
WBC: 9.3 10*3/uL (ref 3.8–10.8)

## 2018-11-23 LAB — COMPLETE METABOLIC PANEL WITH GFR
AG Ratio: 1.5 (calc) (ref 1.0–2.5)
ALT: 15 U/L (ref 9–46)
AST: 14 U/L (ref 10–35)
Albumin: 4 g/dL (ref 3.6–5.1)
Alkaline phosphatase (APISO): 55 U/L (ref 35–144)
BUN: 23 mg/dL (ref 7–25)
CO2: 26 mmol/L (ref 20–32)
Calcium: 9.3 mg/dL (ref 8.6–10.3)
Chloride: 102 mmol/L (ref 98–110)
Creat: 0.9 mg/dL (ref 0.70–1.25)
GFR, Est African American: 101 mL/min/{1.73_m2} (ref >=60)
GFR, Est Non African American: 87 mL/min/{1.73_m2} (ref >=60)
Globulin: 2.6 g/dL (calc) (ref 1.9–3.7)
Glucose, Bld: 93 mg/dL (ref 65–99)
Potassium: 4.2 mmol/L (ref 3.5–5.3)
Sodium: 137 mmol/L (ref 135–146)
Total Bilirubin: 0.4 mg/dL (ref 0.2–1.2)
Total Protein: 6.6 g/dL (ref 6.1–8.1)

## 2018-11-23 LAB — VITAMIN D 25 HYDROXY (VIT D DEFICIENCY, FRACTURES): Vit D, 25-Hydroxy: 111 ng/mL — ABNORMAL HIGH (ref 30–100)

## 2018-11-23 LAB — HEPATITIS C ANTIBODY
Hepatitis C Ab: NONREACTIVE
SIGNAL TO CUT-OFF: 0.07 (ref <1.00)

## 2018-11-23 LAB — MAGNESIUM: Magnesium: 2 mg/dL (ref 1.5–2.5)

## 2018-12-25 DIAGNOSIS — C44612 Basal cell carcinoma of skin of right upper limb, including shoulder: Secondary | ICD-10-CM | POA: Diagnosis not present

## 2019-01-19 DIAGNOSIS — R05 Cough: Secondary | ICD-10-CM | POA: Diagnosis not present

## 2019-01-19 DIAGNOSIS — Z20828 Contact with and (suspected) exposure to other viral communicable diseases: Secondary | ICD-10-CM | POA: Diagnosis not present

## 2019-01-19 DIAGNOSIS — J029 Acute pharyngitis, unspecified: Secondary | ICD-10-CM | POA: Diagnosis not present

## 2019-01-19 DIAGNOSIS — I10 Essential (primary) hypertension: Secondary | ICD-10-CM | POA: Diagnosis not present

## 2019-01-24 ENCOUNTER — Other Ambulatory Visit: Payer: Self-pay

## 2019-01-24 MED ORDER — DIVALPROEX SODIUM 250 MG PO DR TAB: 500.0000 mg | DELAYED_RELEASE_TABLET | Freq: Two times a day (BID) | ORAL | 3 refills | Status: DC

## 2019-02-25 ENCOUNTER — Other Ambulatory Visit: Payer: Self-pay | Admitting: Adult Health Nurse Practitioner

## 2019-02-25 DIAGNOSIS — I10 Essential (primary) hypertension: Secondary | ICD-10-CM

## 2019-04-23 DIAGNOSIS — Z20828 Contact with and (suspected) exposure to other viral communicable diseases: Secondary | ICD-10-CM | POA: Diagnosis not present

## 2019-07-03 ENCOUNTER — Other Ambulatory Visit: Payer: Self-pay

## 2019-07-03 ENCOUNTER — Ambulatory Visit (INDEPENDENT_AMBULATORY_CARE_PROVIDER_SITE_OTHER): Payer: PPO | Admitting: Internal Medicine

## 2019-07-03 ENCOUNTER — Encounter: Payer: Self-pay | Admitting: Internal Medicine

## 2019-07-03 VITALS — BP 126/80 | HR 76 | Temp 97.6°F | Resp 16 | Ht 69.0 in | Wt 179.2 lb

## 2019-07-03 DIAGNOSIS — Z0000 Encounter for general adult medical examination without abnormal findings: Secondary | ICD-10-CM

## 2019-07-03 DIAGNOSIS — E559 Vitamin D deficiency, unspecified: Secondary | ICD-10-CM | POA: Diagnosis not present

## 2019-07-03 DIAGNOSIS — N138 Other obstructive and reflux uropathy: Secondary | ICD-10-CM | POA: Diagnosis not present

## 2019-07-03 DIAGNOSIS — R7309 Other abnormal glucose: Secondary | ICD-10-CM

## 2019-07-03 DIAGNOSIS — Z79899 Other long term (current) drug therapy: Secondary | ICD-10-CM | POA: Diagnosis not present

## 2019-07-03 DIAGNOSIS — G4733 Obstructive sleep apnea (adult) (pediatric): Secondary | ICD-10-CM

## 2019-07-03 DIAGNOSIS — Z125 Encounter for screening for malignant neoplasm of prostate: Secondary | ICD-10-CM | POA: Diagnosis not present

## 2019-07-03 DIAGNOSIS — Z136 Encounter for screening for cardiovascular disorders: Secondary | ICD-10-CM | POA: Diagnosis not present

## 2019-07-03 DIAGNOSIS — N401 Enlarged prostate with lower urinary tract symptoms: Secondary | ICD-10-CM | POA: Diagnosis not present

## 2019-07-03 DIAGNOSIS — I10 Essential (primary) hypertension: Secondary | ICD-10-CM

## 2019-07-03 DIAGNOSIS — E782 Mixed hyperlipidemia: Secondary | ICD-10-CM

## 2019-07-03 DIAGNOSIS — Z0001 Encounter for general adult medical examination with abnormal findings: Secondary | ICD-10-CM

## 2019-07-03 DIAGNOSIS — Z1211 Encounter for screening for malignant neoplasm of colon: Secondary | ICD-10-CM

## 2019-07-03 NOTE — Progress Notes (Signed)
Annual  Screening/Preventative Visit  & Comprehensive Evaluation & Examination     This very nice 69 y.o. MWM presents for a Screening /Preventative Visit & comprehensive evaluation and management of multiple medical co-morbidities.  Patient has been followed for HTN, HLD, Prediabetes and Vitamin D Deficiency.  Patient is followed by Dr Lynder Parents and on Depakote  for Bipolar 1 Disorder. Patient is on CPAP for OSA with improved sleep hygiene. In Feb 2020, he had an anterior Rt THR by Dr Lyla Glassing & has done well since. Patient has IBS-D controlled on 1 loperamide /day.      HTN predates since 2012. Patient's BP has been controlled at home.  Today's BP is at goal - 126/80. Patient denies any cardiac symptoms as chest pain, palpitations, shortness of breath, dizziness or ankle swelling.     Patient's hyperlipidemia is controlled with diet and medications. Patient denies myalgias or other medication SE's. Last lipids were at goal:  Lab Results  Component Value Date   CHOL 163 05/21/2018   HDL 59 05/21/2018   LDLCALC 80 05/21/2018   TRIG 139 05/21/2018   CHOLHDL 2.8 05/21/2018      Patient is monitored expectantly for glucose intolerance  and patient denies reactive hypoglycemic symptoms, visual blurring, diabetic polys or paresthesias. Last A1c was Normal & at goal:  Lab Results  Component Value Date   HGBA1C 5.2 05/21/2018       Finally, patient has history of Vitamin D Deficiency ("28" / 2017)  and last vitamin D was slightly elevated:  Lab Results  Component Value Date   VD25OH 111 (H) 11/22/2018    Current Outpatient Medications on File Prior to Visit  Medication Sig  . aspirin 81 MG chewable tablet Chew 1 tablet (81 mg total) by mouth 2 (two) times daily. (Patient taking differently: Chew 81 mg by mouth daily. )  . bisoprolol-hydrochlorothiazide (ZIAC) 5-6.25 MG tablet Take 1 tablet Daily for BP  . Cholecalciferol (VITAMIN D-3) 125 MCG (5000 UT) TABS Take 5,000 Units by mouth  every evening.   . divalproex (DEPAKOTE) 250 MG DR tablet Take 2 tablets (500 mg total) by mouth 2 (two) times daily.  . naproxen sodium (ALEVE) 220 MG tablet Take 440 mg by mouth 2 (two) times daily as needed (pain.).   No current facility-administered medications on file prior to visit.   Allergies  Allergen Reactions  . Ciprofloxacin Diarrhea   Past Medical History:  Diagnosis Date  . Arthritis   . Bipolar disorder ([place])    controlled by meds since 1997  . Pneumonia   . Sleep apnea    cpap  . Wears glasses    reading   Health Maintenance  Topic Date Due  . COLONOSCOPY  10/29/2020  . TETANUS/TDAP  11/07/2027  . Hepatitis C Screening  Completed  . PNA vac Low Risk Adult  Completed  . INFLUENZA VACCINE  Discontinued   Immunization History  Administered Date(s) Administered  . Moderna SARS-COVID-2 Vaccination 07/02/2019  . Pneumococcal Conjugate-13 04/05/2017  . Pneumococcal Polysaccharide-23 11/22/2018  . Td 11/06/2017   Last Colon - 10/30/2015 - Dr Deborha Payment recc 5 yr f/u  due June 2023  Past Surgical History:  Procedure Laterality Date  . APPENDECTOMY    . COLONOSCOPY    . KNEE ARTHROSCOPY W/ MENISCAL REPAIR  2004   left  . ORIF HUMERUS FRACTURE Left 09/19/2013   Procedure: LEFT OPEN REDUCTION INTERNAL FIXATION (ORIF) PROXIMAL HUMERUS; ROTATOR CUFF REPAIR;  Surgeon: Nestor Ramp  Percell Miller, MD;  Location: [facility];  Service: Orthopedics;  Laterality: Left;  . TONSILLECTOMY    . TOTAL HIP ARTHROPLASTY Left 07/18/2018   Procedure: TOTAL HIP ARTHROPLASTY ANTERIOR APPROACH;  Surgeon: Rod Can, MD;  Location: [facility];  Service: Orthopedics;  Laterality: Left;   History reviewed. No pertinent family history. Social History   Socioeconomic History  . Marital status: Legally Separated    Spouse name: Not on file  . Number of children: 4 children & 11 Grandchildren     Occupational History  . atty  Tobacco Use  . Smoking status: Never Smoker  .  Smokeless tobacco: Never Used  Substance and Sexual Activity  . Alcohol use: Yes    Comment: 5 days a week  . Drug use: No  . Sexual activity: Yes    ROS Constitutional: Denies fever, chills, weight loss/gain, headaches, insomnia,  night sweats or change in appetite. Does c/o fatigue. Eyes: Denies redness, blurred vision, diplopia, discharge, itchy or watery eyes.  ENT: Denies discharge, congestion, post nasal drip, epistaxis, sore throat, earache, hearing loss, dental pain, Tinnitus, Vertigo, Sinus pain or snoring.  Cardio: Denies chest pain, palpitations, irregular heartbeat, syncope, dyspnea, diaphoresis, orthopnea, PND, claudication or edema Respiratory: denies cough, dyspnea, DOE, pleurisy, hoarseness, laryngitis or wheezing.  Gastrointestinal: Denies dysphagia, heartburn, reflux, water brash, pain, cramps, nausea, vomiting, bloating, diarrhea, constipation, hematemesis, melena, hematochezia, jaundice or hemorrhoids Genitourinary: Denies dysuria, frequency, discharge, hematuria or flank pain. Has urgency, nocturia x 2-3 & occasional hesitancy. Musculoskeletal: Denies arthralgia, myalgia, stiffness, Jt. Swelling, pain, limp or strain/sprain. Denies Falls. Skin: Denies puritis, rash, hives, warts, acne, eczema or change in skin lesion Neuro: No weakness, tremor, incoordination, spasms, paresthesia or pain Psychiatric: Denies confusion, memory loss or sensory loss. Denies Depression. Endocrine: Denies change in weight, skin, hair change, nocturia, and paresthesia, diabetic polys, visual blurring or hyper / hypo glycemic episodes.  Heme/Lymph: No excessive bleeding, bruising or enlarged lymph nodes.  Physical Exam  BP 126/80   Pulse 76   Temp 97.6 F (36.4 C)   Resp 16   Ht 5\' 9"  (1.753 m)   Wt 179 lb 3.2 oz (81.3 kg)   BMI 26.46 kg/m   General Appearance: Well nourished and well groomed and in no apparent distress.  Eyes: PERRLA, EOMs, conjunctiva no swelling or erythema, normal  fundi and vessels. Sinuses: No frontal/maxillary tenderness ENT/Mouth: EACs patent / TMs  nl. Nares clear without erythema, swelling, mucoid exudates. Oral hygiene is good. No erythema, swelling, or exudate. Tongue normal, non-obstructing. Tonsils not swollen or erythematous. Hearing normal.  Neck: Supple, thyroid not palpable. No bruits, nodes or JVD. Respiratory: Respiratory effort normal.  BS equal and clear bilateral without rales, rhonci, wheezing or stridor. Cardio: Heart sounds are normal with regular rate and rhythm and no murmurs, rubs or gallops. Peripheral pulses are normal and equal bilaterally without edema. No aortic or femoral bruits. Chest: symmetric with normal excursions and percussion.  Abdomen: Soft, with Nl bowel sounds. Nontender, no guarding, rebound, hernias, masses, or organomegaly.  Lymphatics: Non tender without lymphadenopathy.  Musculoskeletal: Full ROM all peripheral extremities, joint stability, 5/5 strength, and normal gait. Skin: Warm and dry without rashes, lesions, cyanosis, clubbing or  ecchymosis.  Neuro: Cranial nerves intact, reflexes equal bilaterally. Normal muscle tone, no cerebellar symptoms. Sensation intact.  Pysch: Alert and oriented X 3 with normal affect, insight and judgment appropriate.   Assessment and Plan  1. Annual Preventative/Screening Exam   2. Essential hypertension  -  EKG 12-Lead - [place], RETROPERITNL ABD,  LTD - Urinalysis, Routine w reflex microscopic - Microalbumin / creatinine urine ratio - CBC with Differential/Platelet - COMPLETE METABOLIC PANEL WITH GFR - Magnesium - TSH  3. Hyperlipidemia, mixed  - EKG 12-Lead - [place], RETROPERITNL ABD,  LTD - Lipid panel - TSH  4. Abnormal glucose  - EKG 12-Lead - [place], RETROPERITNL ABD,  LTD - Hemoglobin A1c - Insulin, random  5. Vitamin D deficiency  - VITAMIN D 25 Hydroxy  6. OSA on CPAP   7. Screening for colorectal cancer  - POC Hemoccult Bld/Stl (  8. BPH with  obstruction/lower urinary tract symptoms  - PSA  9. On valproic acid therapy  - Valproic acid level  10. Screening for ischemic heart disease  - EKG 12-Lead  11. Prostate cancer screening  - PSA  12. Screening for AAA (aortic abdominal aneurysm)  - [place], RETROPERITNL ABD,  LTD  13. Medication management  - Urinalysis, Routine w reflex microscopic - Microalbumin / creatinine urine ratio - CBC with Differential/Platelet - COMPLETE METABOLIC PANEL WITH GFR - Magnesium - Lipid panel - TSH - Hemoglobin A1c - Insulin, random - VITAMIN D 25 Hydroxy - Valproic acid level        Patient was counseled in prudent diet, weight control to achieve/maintain BMI less than 25, BP monitoring, regular exercise and medications as discussed.  Discussed med effects and SE's. Routine screening labs and tests as requested with regular follow-up as recommended. Over 40 minutes of exam, counseling, chart review and high complex critical decision making was performed   Kirtland Bouchard, MD

## 2019-07-03 NOTE — Patient Instructions (Signed)

## 2019-07-04 LAB — URINALYSIS, ROUTINE W REFLEX MICROSCOPIC
Bacteria, UA: NONE SEEN /HPF
Bilirubin Urine: NEGATIVE
Glucose, UA: NEGATIVE
Hgb urine dipstick: NEGATIVE
Hyaline Cast: NONE SEEN /LPF
Nitrite: NEGATIVE
Protein, ur: NEGATIVE
RBC / HPF: NONE SEEN /HPF (ref 0–2)
Specific Gravity, Urine: 1.027 (ref 1.001–1.03)
Squamous Epithelial / HPF: NONE SEEN /HPF (ref <=5)
pH: <=5 (ref 5.0–8.0)

## 2019-07-04 LAB — CBC WITH DIFFERENTIAL/PLATELET
Absolute Monocytes: 703 cells/uL (ref 200–950)
Basophils Absolute: 10 cells/uL (ref 0–200)
Basophils Relative: 0.1 %
Eosinophils Absolute: 10 cells/uL — ABNORMAL LOW (ref 15–500)
Eosinophils Relative: 0.1 %
HCT: 46 % (ref 38.5–50.0)
Hemoglobin: 15.8 g/dL (ref 13.2–17.1)
Lymphs Abs: 1672 cells/uL (ref 850–3900)
MCH: 31.5 pg (ref 27.0–33.0)
MCHC: 34.3 g/dL (ref 32.0–36.0)
MCV: 91.8 fL (ref 80.0–100.0)
MPV: 12.5 fL (ref 7.5–12.5)
Monocytes Relative: 7.4 %
Neutro Abs: 7106 cells/uL (ref 1500–7800)
Neutrophils Relative %: 74.8 %
Platelets: 183 10*3/uL (ref 140–400)
RBC: 5.01 10*6/uL (ref 4.20–5.80)
RDW: 13 % (ref 11.0–15.0)
Total Lymphocyte: 17.6 %
WBC: 9.5 10*3/uL (ref 3.8–10.8)

## 2019-07-04 LAB — PSA: PSA: 1.2 ng/mL (ref <=4.0)

## 2019-07-04 LAB — COMPLETE METABOLIC PANEL WITH GFR
AG Ratio: 1.5 (calc) (ref 1.0–2.5)
ALT: 23 U/L (ref 9–46)
AST: 22 U/L (ref 10–35)
Albumin: 4.3 g/dL (ref 3.6–5.1)
Alkaline phosphatase (APISO): 48 U/L (ref 35–144)
BUN/Creatinine Ratio: 30 (calc) — ABNORMAL HIGH (ref 6–22)
BUN: 29 mg/dL — ABNORMAL HIGH (ref 7–25)
CO2: 29 mmol/L (ref 20–32)
Calcium: 9.9 mg/dL (ref 8.6–10.3)
Chloride: 99 mmol/L (ref 98–110)
Creat: 0.98 mg/dL (ref 0.70–1.25)
GFR, Est African American: 91 mL/min/{1.73_m2} (ref >=60)
GFR, Est Non African American: 79 mL/min/{1.73_m2} (ref >=60)
Globulin: 2.8 g/dL (calc) (ref 1.9–3.7)
Glucose, Bld: 86 mg/dL (ref 65–99)
Potassium: 4.8 mmol/L (ref 3.5–5.3)
Sodium: 138 mmol/L (ref 135–146)
Total Bilirubin: 0.4 mg/dL (ref 0.2–1.2)
Total Protein: 7.1 g/dL (ref 6.1–8.1)

## 2019-07-04 LAB — LIPID PANEL
Cholesterol: 157 mg/dL (ref <200)
HDL: 51 mg/dL (ref >=40)
LDL Cholesterol (Calc): 89 mg/dL (calc)
Non-HDL Cholesterol (Calc): 106 mg/dL (calc) (ref <130)
Total CHOL/HDL Ratio: 3.1 (calc) (ref <5.0)
Triglycerides: 77 mg/dL (ref <150)

## 2019-07-04 LAB — TSH: TSH: 0.65 mIU/L (ref 0.40–4.50)

## 2019-07-04 LAB — VALPROIC ACID LEVEL: Valproic Acid Lvl: 40.8 mg/L — ABNORMAL LOW (ref 50.0–100.0)

## 2019-07-04 LAB — MICROALBUMIN / CREATININE URINE RATIO
Creatinine, Urine: 205 mg/dL (ref 20–320)
Microalb Creat Ratio: 4 mcg/mg creat (ref <30)
Microalb, Ur: 0.9 mg/dL

## 2019-07-04 LAB — HEMOGLOBIN A1C
Hgb A1c MFr Bld: 5.2 % of total Hgb (ref <5.7)
Mean Plasma Glucose: 103 (calc)
eAG (mmol/L): 5.7 (calc)

## 2019-07-04 LAB — VITAMIN D 25 HYDROXY (VIT D DEFICIENCY, FRACTURES): Vit D, 25-Hydroxy: 77 ng/mL (ref 30–100)

## 2019-07-04 LAB — INSULIN, RANDOM: Insulin: 4.6 u[IU]/mL

## 2019-07-04 LAB — MAGNESIUM: Magnesium: 1.7 mg/dL (ref 1.5–2.5)

## 2019-07-05 ENCOUNTER — Other Ambulatory Visit: Payer: Self-pay | Admitting: Internal Medicine

## 2019-07-05 MED ORDER — MECLIZINE HCL 25 MG PO TABS: ORAL_TABLET | ORAL | 3 refills | Status: DC

## 2019-07-11 DIAGNOSIS — Z20828 Contact with and (suspected) exposure to other viral communicable diseases: Secondary | ICD-10-CM | POA: Diagnosis not present

## 2019-07-11 DIAGNOSIS — Z20822 Contact with and (suspected) exposure to covid-19: Secondary | ICD-10-CM | POA: Diagnosis not present

## 2019-08-15 DIAGNOSIS — G4733 Obstructive sleep apnea (adult) (pediatric): Secondary | ICD-10-CM | POA: Diagnosis not present

## 2019-10-10 DIAGNOSIS — L578 Other skin changes due to chronic exposure to nonionizing radiation: Secondary | ICD-10-CM | POA: Diagnosis not present

## 2019-10-10 DIAGNOSIS — D225 Melanocytic nevi of trunk: Secondary | ICD-10-CM | POA: Diagnosis not present

## 2019-10-10 DIAGNOSIS — L821 Other seborrheic keratosis: Secondary | ICD-10-CM | POA: Diagnosis not present

## 2019-10-10 DIAGNOSIS — Z85828 Personal history of other malignant neoplasm of skin: Secondary | ICD-10-CM | POA: Diagnosis not present

## 2019-10-10 DIAGNOSIS — Z85820 Personal history of malignant melanoma of skin: Secondary | ICD-10-CM | POA: Diagnosis not present

## 2019-10-10 DIAGNOSIS — L570 Actinic keratosis: Secondary | ICD-10-CM | POA: Diagnosis not present

## 2019-10-10 DIAGNOSIS — D2272 Melanocytic nevi of left lower limb, including hip: Secondary | ICD-10-CM | POA: Diagnosis not present

## 2019-11-12 ENCOUNTER — Encounter: Payer: Self-pay | Admitting: Neurology

## 2019-11-14 ENCOUNTER — Ambulatory Visit: Payer: PPO | Admitting: Adult Health

## 2019-11-14 ENCOUNTER — Other Ambulatory Visit: Payer: Self-pay

## 2019-11-14 VITALS — BP 120/74 | HR 56 | Ht 69.0 in | Wt 181.0 lb

## 2019-11-14 DIAGNOSIS — G4733 Obstructive sleep apnea (adult) (pediatric): Secondary | ICD-10-CM

## 2019-11-14 DIAGNOSIS — Z9989 Dependence on other enabling machines and devices: Secondary | ICD-10-CM | POA: Diagnosis not present

## 2019-11-14 NOTE — Patient Instructions (Signed)
Continue using CPAP nightly and greater than 4 hours each night °If your symptoms worsen or you develop new symptoms please let us know.  ° °

## 2019-11-14 NOTE — Progress Notes (Signed)
PATIENT: Timothy [name] DOB: 11/28/1950  REASON FOR VISIT: follow up HISTORY FROM: patient  HISTORY OF PRESENT ILLNESS: Today 11/14/19:  Timothy [name] is a 69 year old male with a history of obstructive sleep apnea on CPAP.  He returns today for follow-up.  His download indicates that he uses machine nightly for compliance of 100%.  He uses machine greater than 4 hours 29 days for compliance of 97%.  On average he uses his machine 5 hours and 42 minutes.  His residual AHI is 3.2 on 5 to 15 cm of water with EPR of 2.  Leak in the 95th percentile is 21.5 L/min he returns today for an evaluation.  HISTORY 11/08/18:  Timothy [name] is a 69 year old male with a history of obstructive sleep apnea on CPAP.  He returns today for follow-up.  His CPAP download indicates that he uses machine 29 out of 30 days for compliance of 97%.  He uses machine greater than 4 hours 26 out of 30 days for compliance of 87%.  On average he uses his machine 5 hours and 36 minutes.  His residual AHI is 4 on 5 to 15 cm of water with EPR of 2.  His leak in the 95th percentile is 21.9 L/min.  He reports that the CPAP continues to work well for him.  He has order new supplies which may explain his leak.  He joins me today for follow-up.  REVIEW OF SYSTEMS: Out of a complete 14 system review of symptoms, the patient complains only of the following symptoms, and all other reviewed systems are negative.   ESS 2  ALLERGIES: Allergies  Allergen Reactions   Ciprofloxacin Diarrhea    HOME MEDICATIONS: Outpatient Medications Prior to Visit  Medication Sig Dispense Refill   aspirin 81 MG chewable tablet Chew 1 tablet (81 mg total) by mouth 2 (two) times daily. (Patient taking differently: Chew 81 mg by mouth daily. ) 60 tablet 1   bisoprolol-hydrochlorothiazide (ZIAC) 5-6.25 MG tablet Take 1 tablet Daily for BP 90 tablet 3   Cholecalciferol (VITAMIN D-3) 125 MCG (5000 UT) TABS Take 5,000 Units by mouth every evening.       divalproex (DEPAKOTE) 250 MG DR tablet Take 2 tablets (500 mg total) by mouth 2 (two) times daily. 360 tablet 3   meclizine (ANTIVERT) 25 MG tablet Take 1/2 to 1 tablet 2 to 3 x /day as needed for Dizziness / Vertigo 90 tablet 3   naproxen sodium (ALEVE) 220 MG tablet Take 440 mg by mouth 2 (two) times daily as needed (pain.).     No facility-administered medications prior to visit.    PAST MEDICAL HISTORY: Past Medical History:  Diagnosis Date   Arthritis    Bipolar disorder ([place])    controlled by meds since 1997   Pneumonia    Sleep apnea    cpap   Wears glasses    reading    PAST SURGICAL HISTORY: Past Surgical History:  Procedure Laterality Date   APPENDECTOMY     COLONOSCOPY     KNEE ARTHROSCOPY W/ MENISCAL REPAIR  2004   left   ORIF HUMERUS FRACTURE Left 09/19/2013   Procedure: LEFT OPEN REDUCTION INTERNAL FIXATION (ORIF) PROXIMAL HUMERUS; ROTATOR CUFF REPAIR;  Surgeon: [name], MD;  Location: [facility];  Service: Orthopedics;  Laterality: Left;   TONSILLECTOMY     TOTAL HIP ARTHROPLASTY Left 07/18/2018   Procedure: TOTAL HIP ARTHROPLASTY ANTERIOR APPROACH;  Surgeon: Rod Can, MD;  Location:  [facility];  Service: Orthopedics;  Laterality: Left;    FAMILY HISTORY: No family history on file.  SOCIAL HISTORY: Social History   Socioeconomic History   Marital status: Married    Spouse name: Not on file   Number of children: Not on file   Years of education: Not on file   Highest education level: Not on file  Occupational History   Not on file  Tobacco Use   Smoking status: Never Smoker   Smokeless tobacco: Never Used  Vaping Use   Vaping Use: Never used  Substance and Sexual Activity   Alcohol use: Yes    Comment: 5 days a week   Drug use: No   Sexual activity: Yes  Other Topics Concern   Not on file  Social History Narrative   Not on file   Social Determinants of Health   Financial Resource Strain:      Difficulty of Paying Living Expenses:   Food Insecurity:    Worried About Charity fundraiser in the Last Year:    Arboriculturist in the Last Year:   Transportation Needs:    Film/video editor (Medical):    Lack of Transportation (Non-Medical):   Physical Activity:    Days of Exercise per Week:    Minutes of Exercise per Session:   Stress:    Feeling of Stress :   Social Connections:    Frequency of Communication with Friends and Family:    Frequency of Social Gatherings with Friends and Family:    Attends Religious Services:    Active Member of Clubs or Organizations:    Attends Archivist Meetings:    Marital Status:   Intimate Partner Violence:    Fear of Current or Ex-Partner:    Emotionally Abused:    Physically Abused:    Sexually Abused:       PHYSICAL EXAM  Vitals:   11/14/19 0937  BP: 120/74  Pulse: (!) 56  Weight: 181 lb (82.1 kg)  Height: 5\' 9"  (1.753 m)   Body mass index is 26.73 kg/m.  Generalized: Well developed, in no acute distress  Chest: Lungs clear to auscultation bilaterally  Neurological examination  Mentation: Alert oriented to time, place, history taking. Follows all commands speech and language fluent Cranial nerve II-XII: Extraocular movements were full, visual field were full on confrontational test Head turning and shoulder shrug  were normal and symmetric. Motor: The motor testing reveals 5 over 5 strength of all 4 extremities. Good symmetric motor tone is noted throughout.  Sensory: Sensory testing is intact to soft touch on all 4 extremities. No evidence of extinction is noted.  Gait and station: Gait is normal.    DIAGNOSTIC DATA (LABS, IMAGING, TESTING) - I reviewed patient records, labs, notes, testing and imaging myself where available.  Lab Results  Component Value Date   WBC 9.5 07/03/2019   HGB 15.8 07/03/2019   HCT 46.0 07/03/2019   MCV 91.8 07/03/2019   PLT 183 07/03/2019       Component Value Date/Time   NA 138 07/03/2019 1541   K 4.8 07/03/2019 1541   CL 99 07/03/2019 1541   CO2 29 07/03/2019 1541   GLUCOSE 86 07/03/2019 1541   BUN 29 (H) 07/03/2019 1541   CREATININE 0.98 07/03/2019 1541   CALCIUM 9.9 07/03/2019 1541   PROT 7.1 07/03/2019 1541   ALBUMIN 4.1 09/01/2016 1519   AST 22 07/03/2019 1541   ALT 23 07/03/2019 1541  ALKPHOS 45 09/01/2016 1519   BILITOT 0.4 07/03/2019 1541   GFRNONAA 79 07/03/2019 1541   GFRAA 91 07/03/2019 1541   Lab Results  Component Value Date   CHOL 157 07/03/2019   HDL 51 07/03/2019   LDLCALC 89 07/03/2019   TRIG 77 07/03/2019   CHOLHDL 3.1 07/03/2019   Lab Results  Component Value Date   HGBA1C 5.2 07/03/2019   No results found for: [id number] Lab Results  Component Value Date   TSH 0.65 07/03/2019      ASSESSMENT AND PLAN 69 y.o. year old male  has a past medical history of Arthritis, Bipolar disorder ([place]), Pneumonia, Sleep apnea, and Wears glasses. here with:  1. OSA on CPAP  - CPAP compliance excellent - Good treatment of AHI  - Encourage patient to use CPAP nightly and > 4 hours each night - F/U in 1 year or sooner if needed   I spent 20 minutes of face-to-face and non-face-to-face time with patient.  This included previsit chart review, lab review, study review, order entry, electronic health record documentation, patient education.  Ward Givens, MSN, NP-C 11/14/2019, 10:02 AM [facility] [address], [place], [place] [address] [phone]

## 2019-11-26 ENCOUNTER — Ambulatory Visit: Payer: Medicare Other | Admitting: Adult Health

## 2019-12-03 ENCOUNTER — Encounter: Payer: Self-pay | Admitting: Adult Health

## 2019-12-03 ENCOUNTER — Other Ambulatory Visit: Payer: Self-pay

## 2019-12-03 ENCOUNTER — Ambulatory Visit (INDEPENDENT_AMBULATORY_CARE_PROVIDER_SITE_OTHER): Payer: PPO | Admitting: Adult Health

## 2019-12-03 VITALS — BP 122/72 | HR 69 | Temp 97.3°F | Resp 16 | Ht 69.0 in | Wt 179.6 lb

## 2019-12-03 DIAGNOSIS — R7309 Other abnormal glucose: Secondary | ICD-10-CM | POA: Diagnosis not present

## 2019-12-03 DIAGNOSIS — Z9989 Dependence on other enabling machines and devices: Secondary | ICD-10-CM

## 2019-12-03 DIAGNOSIS — G4733 Obstructive sleep apnea (adult) (pediatric): Secondary | ICD-10-CM

## 2019-12-03 DIAGNOSIS — Z86010 Personal history of colonic polyps: Secondary | ICD-10-CM | POA: Diagnosis not present

## 2019-12-03 DIAGNOSIS — Z0001 Encounter for general adult medical examination with abnormal findings: Secondary | ICD-10-CM | POA: Diagnosis not present

## 2019-12-03 DIAGNOSIS — R6889 Other general symptoms and signs: Secondary | ICD-10-CM

## 2019-12-03 DIAGNOSIS — F329 Major depressive disorder, single episode, unspecified: Secondary | ICD-10-CM | POA: Diagnosis not present

## 2019-12-03 DIAGNOSIS — Z79899 Other long term (current) drug therapy: Secondary | ICD-10-CM | POA: Diagnosis not present

## 2019-12-03 DIAGNOSIS — F319 Bipolar disorder, unspecified: Secondary | ICD-10-CM | POA: Insufficient documentation

## 2019-12-03 DIAGNOSIS — Z0000 Encounter for general adult medical examination without abnormal findings: Secondary | ICD-10-CM

## 2019-12-03 DIAGNOSIS — I10 Essential (primary) hypertension: Secondary | ICD-10-CM | POA: Diagnosis not present

## 2019-12-03 DIAGNOSIS — E559 Vitamin D deficiency, unspecified: Secondary | ICD-10-CM

## 2019-12-03 DIAGNOSIS — F32A Depression, unspecified: Secondary | ICD-10-CM

## 2019-12-03 NOTE — Progress Notes (Signed)
MEDICARE ANNUAL WELLNESS VISIT AND FOLLOW UP Assessment:   Diagnoses and all orders for this visit:  Encounter for Medicare annual wellness exam  Essential hypertension Continue medication Monitor blood pressure at home; call if consistently over 130/80 Continue DASH diet.   Reminder to go to the ER if any CP, SOB, nausea, dizziness, severe HA, changes vision/speech, left arm numbness and tingling and jaw pain.  Vitamin D deficiency Continue supplementation; goal is 60-100;  Defer vitamin D level  OSA on CPAP Reports 100% compliance; no concerns; follows with Dr. [name]  Other abnormal glucose Recent A1Cs at goal Discussed diet/exercise, weight management  Defer A1C; check CMP  Medication management CBC, CMP/GFR  Bipolar type 1 depression ([place]) Continue follow up with Dr. Clovis Pu Check valproic acid levels PRN only; has been stable for many years  BMI 26, adult Continue to recommend diet heavy in fruits and veggies and low in animal meats, cheeses, and dairy products, appropriate calorie intake Discuss exercise recommendations routinely Continue to monitor weight at each visit  Over 30 minutes of exam, counseling, chart review, and critical decision making was performed  Future Appointments  Date Time Provider [place]  07/20/2020  3:00 PM [name], MD [facility] None  11/19/2020  8:30 AM Ward Givens, NP [facility] None     Plan:   During the course of the visit the patient was educated and counseled about appropriate screening and preventive services including:    Pneumococcal vaccine   Influenza vaccine  Prevnar 13  Td vaccine  Screening electrocardiogram  Colorectal cancer screening  Diabetes screening  Glaucoma screening  Nutrition counseling    Subjective:  Timothy [name] is a 69 y.o. male who presents for Medicare Annual Wellness Visit and 6 month follow up for HTN, hyperlipidemia, glucose management, and vitamin D Def.    Patient  followed by Dr Clovis Pu for Bipolar Depression & is on Depakote. Levels were checked in Feb.   Patient had a colonoscopy (May 2017) with a benign polyp per Dr Amedeo Plenty, 5 year follow up recommended.   He has OSA on CPAP since 11/2017; no issues, endorses 100% compliance and endorses restorative sleep. Followed by Dr. Beacher May annually.   BMI is Body mass index is 26.52 kg/m., he has been working on diet and exercise, does free weights at home 45-60 min  twice a week, fairly active around the home with yard, etc.  Wt Readings from Last 3 Encounters:  12/03/19 179 lb 9.6 oz (81.5 kg)  11/14/19 181 lb (82.1 kg)  07/03/19 179 lb 3.2 oz (81.3 kg)   His blood pressure has been controlled at home, today their BP is BP: 122/72 He does workout. He denies chest pain, shortness of breath, dizziness.   He is not on cholesterol medication and denies myalgias. His cholesterol is at goal and has been for several years. The cholesterol last visit was:   Lab Results  Component Value Date   CHOL 157 07/03/2019   HDL 51 07/03/2019   LDLCALC 89 07/03/2019   TRIG 77 07/03/2019   CHOLHDL 3.1 07/03/2019   He has been working on diet and exercise for glucose management, and denies foot ulcerations, increased appetite, nausea, paresthesia of the feet, polydipsia, polyuria, visual disturbances, vomiting and weight loss. Last A1C in the office was:  Lab Results  Component Value Date   HGBA1C 5.2 07/03/2019   Last GFR Lab Results  Component Value Date   GFRNONAA 79 07/03/2019    Patient is on Vitamin  D supplement, no recent dose change  Lab Results  Component Value Date   VD25OH 77 07/03/2019      Medication Review:   Current Outpatient Medications (Cardiovascular):  .  bisoprolol-hydrochlorothiazide (ZIAC) 5-6.25 MG tablet, Take 1 tablet Daily for BP   Current Outpatient Medications (Analgesics):  .  aspirin 81 MG chewable tablet, Chew 1 tablet (81 mg total) by mouth 2 (two) times daily.  (Patient taking differently: Chew 81 mg by mouth daily. ) .  naproxen sodium (ALEVE) 220 MG tablet, Take 440 mg by mouth 2 (two) times daily as needed (pain.).   Current Outpatient Medications (Other):  Marland Kitchen  Cholecalciferol (VITAMIN D-3) 125 MCG (5000 UT) TABS, Take 5,000 Units by mouth every evening.  .  divalproex (DEPAKOTE) 250 MG DR tablet, Take 2 tablets (500 mg total) by mouth 2 (two) times daily. .  meclizine (ANTIVERT) 25 MG tablet, Take 1/2 to 1 tablet 2 to 3 x /day as needed for Dizziness / Vertigo  Allergies: Allergies  Allergen Reactions  . Ciprofloxacin Diarrhea    Current Problems (verified) has HTN (hypertension); Vitamin D deficiency; Other abnormal glucose; Diarrhea; Medication management; Depression; History of adenomatous polyp of colon; S/P total hip arthroplasty; OSA on CPAP; and Bipolar I disorder ([place]) on their problem list.  Screening Tests Immunization History  Administered Date(s) Administered  . Moderna SARS-COVID-2 Vaccination 07/02/2019, 07/30/2019  . Pneumococcal Conjugate-13 04/05/2017  . Pneumococcal Polysaccharide-23 11/22/2018  . Td 11/06/2017   Preventative care: Last colonoscopy: May/2017 - benign polyp - Dr. Amedeo Plenty -5 year follow up  Prior vaccinations: TD or Tdap: 2019 Influenza: 04/2018  Pneumococcal: 10/2018 Prevnar13: 2018 Shingles/Zostavax: had at previous practice Covid 19: 2/2, 2021, moderna   Names of Other Physician/Practitioners you currently use: 1. [facility] here for primary care 2. Dr. ?, eye doctor, last visit 2019, no issues, readers  3. Dr. Orvil Feil, dentist, last visit 08/2019, q17m 4. Dermatology specialists, annually, last in 09/2019  Patient Care Team: [name], MD as PCP - General (Internal Medicine) Cottle, Billey Co., MD as Attending Physician (Psychiatry)  Surgical: He  has a past surgical history that includes Tonsillectomy; Appendectomy; Colonoscopy; Knee arthroscopy  w/ meniscal repair (2004); ORIF humerus fracture (Left, 09/19/2013); and Total hip arthroplasty (Left, 07/18/2018). Family His family history is not on file. Social history  He reports that he has never smoked. He has never used smokeless tobacco. He reports current alcohol use. He reports that he does not use drugs.  MEDICARE WELLNESS OBJECTIVES: Physical activity: Current Exercise Habits: Home exercise routine, Type of exercise: strength training/weights, Time (Minutes): 60, Frequency (Times/Week): 2, Weekly Exercise (Minutes/Week): 120, Intensity: Mild, Exercise limited by: None identified Cardiac risk factors: Cardiac Risk Factors include: advanced age (>52men, >52 women);male gender;hypertension Depression/mood screen:   Depression screen [facility] 2/9 12/03/2019  Decreased Interest 0  Down, Depressed, Hopeless 0  PHQ - 2 Score 0  Altered sleeping 0  Tired, decreased energy 0  Change in appetite 0  Feeling bad or failure about yourself  0  Trouble concentrating 0  Moving slowly or fidgety/restless 0  Suicidal thoughts 0  PHQ-9 Score 0    ADLs:  In your present state of health, do you have any difficulty performing the following activities: 12/03/2019 07/03/2019  Hearing? N N  Vision? N N  Difficulty concentrating or making decisions? N N  Walking or climbing stairs? N N  Dressing or bathing? N N  Doing errands, shopping? N N  Some recent data might be hidden     Cognitive Testing  Alert? Yes  Normal Appearance?Yes  Oriented to person? Yes  Place? Yes   Time? Yes  Recall of three objects?  Yes  Can perform simple calculations? Yes  Displays appropriate judgment?Yes  Can read the correct time from a watch face?Yes  EOL planning: Does Patient Have a Medical Advance Directive?: Yes Type of Advance Directive: Healthcare Power of Attorney, Living will Does patient want to make changes to medical advance directive?: No - Patient declined Copy of [place] in Chart?:  No - copy requested   Objective:   Today's Vitals   12/03/19 1506  BP: 122/72  Pulse: 69  Resp: 16  Temp: (!) 97.3 F (36.3 C)  SpO2: 97%  Weight: 179 lb 9.6 oz (81.5 kg)  Height: 5\' 9"  (1.753 m)   Body mass index is 26.52 kg/m.  General appearance: alert, no distress, WD/WN, male HEENT: normocephalic, sclerae anicteric, TMs pearly, nares patent, no discharge or erythema, pharynx normal Oral cavity: MMM, no lesions Neck: supple, no lymphadenopathy, no thyromegaly, no masses Heart: RRR, normal S1, S2, no murmurs Lungs: CTA bilaterally, no wheezes, rhonchi, or rales Abdomen: +bs, soft, non tender, non distended, no masses, no hepatomegaly, no splenomegaly Musculoskeletal: nontender, no swelling, no obvious deformity Extremities: no edema, no cyanosis, no clubbing Pulses: 2+ symmetric, upper and lower extremities, normal cap refill Neurological: alert, oriented x 3, CN2-12 intact, strength normal upper extremities and lower extremities, sensation normal throughout, DTRs 2+ throughout, no cerebellar signs, gait normal Psychiatric: normal affect, behavior normal, pleasant   Medicare Attestation I have personally reviewed: The patient's medical and social history Their use of alcohol, tobacco or illicit drugs Their current medications and supplements The patient's functional ability including ADLs,fall risks, home safety risks, cognitive, and hearing and visual impairment Diet and physical activities Evidence for depression or mood disorders  The patient's weight, height, BMI, and visual acuity have been recorded in the chart.  I have made referrals, counseling, and provided education to the patient based on review of the above and I have provided the patient with a written personalized care plan for preventive services.     Izora Ribas, NP   12/03/2019

## 2019-12-03 NOTE — Patient Instructions (Addendum)
    Timothy [name] , Thank you for taking time to come for your Medicare Wellness Visit. I appreciate your ongoing commitment to your health goals. Please review the following plan we discussed and let me know if I can assist you in the future.   These are the goals we discussed: Goals    . Exercise 150 min/wk Moderate Activity       This is a list of the screening recommended for you and due dates:  Health Maintenance  Topic Date Due  . Colon Cancer Screening  10/29/2020  . Tetanus Vaccine  11/07/2027  . COVID-19 Vaccine  Completed  .  Hepatitis C: One time screening is recommended by Center for Disease Control  (CDC) for  adults born from [birth year] through 1965.   Completed  . Pneumonia vaccines  Completed  . Flu Shot  Discontinued      Know what a healthy weight is for you (roughly BMI <25) and aim to maintain this  Aim for 7+ servings of fruits and vegetables daily  65-80+ fluid ounces of water or unsweet tea for healthy kidneys  Limit to max 1 drink of alcohol per day; avoid smoking/tobacco  Limit animal fats in diet for cholesterol and heart health - choose grass fed whenever available  Avoid highly processed foods, and foods high in saturated/trans fats  Aim for low stress - take time to unwind and care for your mental health  Aim for 150 min of moderate intensity exercise weekly for heart health, and weights twice weekly for bone health  Aim for 7-9 hours of sleep daily

## 2019-12-04 LAB — COMPLETE METABOLIC PANEL WITH GFR
AG Ratio: 1.5 (calc) (ref 1.0–2.5)
ALT: 13 U/L (ref 9–46)
AST: 15 U/L (ref 10–35)
Albumin: 4 g/dL (ref 3.6–5.1)
Alkaline phosphatase (APISO): 49 U/L (ref 35–144)
BUN/Creatinine Ratio: 34 (calc) — ABNORMAL HIGH (ref 6–22)
BUN: 31 mg/dL — ABNORMAL HIGH (ref 7–25)
CO2: 27 mmol/L (ref 20–32)
Calcium: 9.1 mg/dL (ref 8.6–10.3)
Chloride: 106 mmol/L (ref 98–110)
Creat: 0.91 mg/dL (ref 0.70–1.25)
GFR, Est African American: 99 mL/min/{1.73_m2} (ref >=60)
GFR, Est Non African American: 86 mL/min/{1.73_m2} (ref >=60)
Globulin: 2.7 g/dL (calc) (ref 1.9–3.7)
Glucose, Bld: 125 mg/dL — ABNORMAL HIGH (ref 65–99)
Potassium: 4.1 mmol/L (ref 3.5–5.3)
Sodium: 138 mmol/L (ref 135–146)
Total Bilirubin: 0.3 mg/dL (ref 0.2–1.2)
Total Protein: 6.7 g/dL (ref 6.1–8.1)

## 2019-12-04 LAB — CBC WITH DIFFERENTIAL/PLATELET
Absolute Monocytes: 765 cells/uL (ref 200–950)
Basophils Absolute: 27 cells/uL (ref 0–200)
Basophils Relative: 0.3 %
Eosinophils Absolute: 117 cells/uL (ref 15–500)
Eosinophils Relative: 1.3 %
HCT: 43.3 % (ref 38.5–50.0)
Hemoglobin: 14.5 g/dL (ref 13.2–17.1)
Lymphs Abs: 3222 cells/uL (ref 850–3900)
MCH: 31.1 pg (ref 27.0–33.0)
MCHC: 33.5 g/dL (ref 32.0–36.0)
MCV: 92.9 fL (ref 80.0–100.0)
MPV: 11.6 fL (ref 7.5–12.5)
Monocytes Relative: 8.5 %
Neutro Abs: 4869 cells/uL (ref 1500–7800)
Neutrophils Relative %: 54.1 %
Platelets: 173 10*3/uL (ref 140–400)
RBC: 4.66 10*6/uL (ref 4.20–5.80)
RDW: 12.5 % (ref 11.0–15.0)
Total Lymphocyte: 35.8 %
WBC: 9 10*3/uL (ref 3.8–10.8)

## 2019-12-04 LAB — MAGNESIUM: Magnesium: 1.9 mg/dL (ref 1.5–2.5)

## 2020-01-16 ENCOUNTER — Other Ambulatory Visit: Payer: Self-pay | Admitting: Psychiatry

## 2020-01-20 DIAGNOSIS — G4733 Obstructive sleep apnea (adult) (pediatric): Secondary | ICD-10-CM | POA: Diagnosis not present

## 2020-01-23 ENCOUNTER — Ambulatory Visit (INDEPENDENT_AMBULATORY_CARE_PROVIDER_SITE_OTHER): Payer: PRIVATE HEALTH INSURANCE | Admitting: Psychiatry

## 2020-01-23 ENCOUNTER — Encounter: Payer: Self-pay | Admitting: Psychiatry

## 2020-01-23 ENCOUNTER — Other Ambulatory Visit: Payer: Self-pay

## 2020-01-23 DIAGNOSIS — F319 Bipolar disorder, unspecified: Secondary | ICD-10-CM | POA: Diagnosis not present

## 2020-01-23 MED ORDER — DIVALPROEX SODIUM 250 MG PO DR TAB: 500.0000 mg | DELAYED_RELEASE_TABLET | Freq: Two times a day (BID) | ORAL | 3 refills | Status: DC

## 2020-01-23 NOTE — Progress Notes (Signed)
Timothy [name] [id number] September 23, 1950 69 y.o.  Subjective:   Patient ID:  Timothy [name] is a 69 y.o. (DOB September 06, 1950) male.  Chief Complaint:  Chief Complaint  Patient presents with  . Follow-up    Medication Management  . Other    Bipolar    HPI Timothy [name] presents to the office today for follow-up of bipolar disorder and has been on Depakote DR 500 mg twice daily for many years.  Last seen 10/2018.  No meds changed.  Typically seen yearly in recent years because of long-term stability  01/23/20 appt with the following noted: Covid free. Health good.  Sleep 6 1/2 hours with CPAP.  Alert daytime. Overall doing alright mood.  Hip replacement gone bad after 1 and 1/2 year.  Appt Tuesday.  Function otherwise OK. Doesn't plan to retire so still working. No SE.  Mood stable and good.  Patient reports stable mood and denies depressed or irritable moods.  Patient denies any recent difficulty with anxiety.  Patient denies difficulty with sleep initiation or maintenance. Denies appetite disturbance.  Patient reports that energy and motivation have been good.  Patient denies any difficulty with concentration.  Patient denies any suicidal ideation.   Living with fiance Timothy [name] with plans to marry and Covid interfered.   Past Psychiatric Medication Trials: Depakote 500 mg twice daily, paroxetine 20, Ambien, ProSom, lorazepam, Effexor He has been on Depakote alone at 7 52,000 mg daily since 1998  Review of Systems:  Review of Systems  Musculoskeletal: Positive for arthralgias and gait problem. Negative for back pain.  Neurological: Negative for tremors and weakness.  Hip replacement 06/2018 CPAP since [facility] July 2019. Going well.  Medications: I have reviewed the patient's current medications.  Current Outpatient Medications  Medication Sig Dispense Refill  . aspirin 81 MG chewable tablet Chew 1 tablet (81 mg total) by mouth 2 (two) times daily. (Patient taking differently: Chew 81 mg by  mouth daily. ) 60 tablet 1  . bisoprolol-hydrochlorothiazide (ZIAC) 5-6.25 MG tablet Take 1 tablet Daily for BP 90 tablet 3  . Cholecalciferol (VITAMIN D-3) 125 MCG (5000 UT) TABS Take 5,000 Units by mouth every evening.     . divalproex (DEPAKOTE) 250 MG DR tablet TAKE 2 TABLETS (500 MG TOTAL) BY MOUTH 2 (TWO) TIMES DAILY. 360 tablet 0  . naproxen sodium (ALEVE) 220 MG tablet Take 440 mg by mouth 2 (two) times daily as needed (pain.).     No current facility-administered medications for this visit.    Medication Side Effects: None  Allergies:  Allergies  Allergen Reactions  . Ciprofloxacin Diarrhea    Past Medical History:  Diagnosis Date  . Arthritis   . Bipolar disorder ([place])    controlled by meds since 1997  . Pneumonia   . Sleep apnea    cpap  . Wears glasses    reading    History reviewed. No pertinent family history.  Social History   Socioeconomic History  . Marital status: Married    Spouse name: Not on file  . Number of children: Not on file  . Years of education: Not on file  . Highest education level: Not on file  Occupational History  . Not on file  Tobacco Use  . Smoking status: Never Smoker  . Smokeless tobacco: Never Used  Vaping Use  . Vaping Use: Never used  Substance and Sexual Activity  . Alcohol use: Yes    Comment: 5 days a week  . Drug use: No  .  Sexual activity: Yes  Other Topics Concern  . Not on file  Social History Narrative  . Not on file   Social Determinants of Health   Financial Resource Strain:   . Difficulty of Paying Living Expenses: Not on file  Food Insecurity:   . Worried About Charity fundraiser in the Last Year: Not on file  . Ran Out of Food in the Last Year: Not on file  Transportation Needs:   . Lack of Transportation (Medical): Not on file  . Lack of Transportation (Non-Medical): Not on file  Physical Activity:   . Days of Exercise per Week: Not on file  . Minutes of Exercise per Session: Not on file   Stress:   . Feeling of Stress : Not on file  Social Connections:   . Frequency of Communication with Friends and Family: Not on file  . Frequency of Social Gatherings with Friends and Family: Not on file  . Attends Religious Services: Not on file  . Active Member of Clubs or Organizations: Not on file  . Attends Archivist Meetings: Not on file  . Marital Status: Not on file  Intimate Partner Violence:   . Fear of Current or Ex-Partner: Not on file  . Emotionally Abused: Not on file  . Physically Abused: Not on file  . Sexually Abused: Not on file    Past Medical History, Surgical history, Social history, and Family history were reviewed and updated as appropriate.   Please see review of systems for further details on the patient's review from today.   Objective:   Physical Exam:  There were no vitals taken for this visit.  Physical Exam Constitutional:      General: He is not in acute distress.    Appearance: He is well-developed.  Musculoskeletal:        General: No deformity.  Neurological:     Mental Status: He is alert and oriented to person, place, and time.     Coordination: Coordination normal.  Psychiatric:        Attention and Perception: Attention normal. He is attentive. He does not perceive auditory hallucinations.        Mood and Affect: Mood normal. Mood is not anxious or depressed. Affect is not labile, blunt, angry or inappropriate.        Speech: Speech normal. Speech is not rapid and pressured.        Behavior: Behavior normal.        Thought Content: Thought content normal. Thought content does not include homicidal or suicidal ideation. Thought content does not include homicidal or suicidal plan.        Cognition and Memory: Cognition normal.        Judgment: Judgment normal.     Comments: Insight is good.     Lab Review:     Component Value Date/Time   NA 138 12/03/2019 1525   K 4.1 12/03/2019 1525   CL 106 12/03/2019 1525   CO2 27  12/03/2019 1525   GLUCOSE 125 (H) 12/03/2019 1525   BUN 31 (H) 12/03/2019 1525   CREATININE 0.91 12/03/2019 1525   CALCIUM 9.1 12/03/2019 1525   PROT 6.7 12/03/2019 1525   ALBUMIN 4.1 09/01/2016 1519   AST 15 12/03/2019 1525   ALT 13 12/03/2019 1525   ALKPHOS 45 09/01/2016 1519   BILITOT 0.3 12/03/2019 1525   GFRNONAA 86 12/03/2019 1525   GFRAA 99 12/03/2019 1525       Component  Value Date/Time   WBC 9.0 12/03/2019 1525   RBC 4.66 12/03/2019 1525   HGB 14.5 12/03/2019 1525   HCT 43.3 12/03/2019 1525   PLT 173 12/03/2019 1525   MCV 92.9 12/03/2019 1525   MCH 31.1 12/03/2019 1525   MCHC 33.5 12/03/2019 1525   RDW 12.5 12/03/2019 1525   LYMPHSABS 3,222 12/03/2019 1525   MONOABS 721 07/21/2016 1630   EOSABS 117 12/03/2019 1525   BASOSABS 27 12/03/2019 1525    No results found for: POCLITH, LITHIUM   Lab Results  Component Value Date   VALPROATE 40.8 (L) 07/03/2019     .res Assessment: Plan:    Bipolar I disorder ([place])   No change indicated.  Good response.  Labs per PCP yearly - good as noted above incl LFT's and CBC and VPA usually low normal and working.  FU 1 year.  Lynder Parents, MD, DFAPA  Please see After Visit Summary for patient specific instructions.  Future Appointments  Date Time Provider [place]  07/20/2020  3:00 PM [name], MD [facility] None  11/19/2020  8:30 AM Ward Givens, NP [facility] None  12/07/2020  3:00 PM Liane Comber, NP [facility] None    No orders of the defined types were placed in this encounter.     -------------------------------

## 2020-01-28 DIAGNOSIS — Z471 Aftercare following joint replacement surgery: Secondary | ICD-10-CM | POA: Diagnosis not present

## 2020-01-28 DIAGNOSIS — Z96649 Presence of unspecified artificial hip joint: Secondary | ICD-10-CM | POA: Diagnosis not present

## 2020-01-28 DIAGNOSIS — M545 Low back pain: Secondary | ICD-10-CM | POA: Diagnosis not present

## 2020-01-28 DIAGNOSIS — Z96642 Presence of left artificial hip joint: Secondary | ICD-10-CM | POA: Diagnosis not present

## 2020-02-01 DIAGNOSIS — M5416 Radiculopathy, lumbar region: Secondary | ICD-10-CM | POA: Diagnosis not present

## 2020-02-10 DIAGNOSIS — M545 Low back pain: Secondary | ICD-10-CM | POA: Diagnosis not present

## 2020-02-22 ENCOUNTER — Other Ambulatory Visit: Payer: Self-pay | Admitting: Internal Medicine

## 2020-02-22 DIAGNOSIS — I10 Essential (primary) hypertension: Secondary | ICD-10-CM

## 2020-03-06 DIAGNOSIS — M4180 Other forms of scoliosis, site unspecified: Secondary | ICD-10-CM | POA: Insufficient documentation

## 2020-03-06 DIAGNOSIS — R29898 Other symptoms and signs involving the musculoskeletal system: Secondary | ICD-10-CM | POA: Diagnosis not present

## 2020-03-06 DIAGNOSIS — M4150 Other secondary scoliosis, site unspecified: Secondary | ICD-10-CM | POA: Insufficient documentation

## 2020-03-19 DIAGNOSIS — M79652 Pain in left thigh: Secondary | ICD-10-CM | POA: Diagnosis not present

## 2020-03-19 DIAGNOSIS — M25552 Pain in left hip: Secondary | ICD-10-CM | POA: Diagnosis not present

## 2020-03-19 DIAGNOSIS — M6281 Muscle weakness (generalized): Secondary | ICD-10-CM | POA: Diagnosis not present

## 2020-03-19 DIAGNOSIS — R262 Difficulty in walking, not elsewhere classified: Secondary | ICD-10-CM | POA: Diagnosis not present

## 2020-03-31 DIAGNOSIS — M79652 Pain in left thigh: Secondary | ICD-10-CM | POA: Diagnosis not present

## 2020-03-31 DIAGNOSIS — M25552 Pain in left hip: Secondary | ICD-10-CM | POA: Diagnosis not present

## 2020-03-31 DIAGNOSIS — R262 Difficulty in walking, not elsewhere classified: Secondary | ICD-10-CM | POA: Diagnosis not present

## 2020-03-31 DIAGNOSIS — M6281 Muscle weakness (generalized): Secondary | ICD-10-CM | POA: Diagnosis not present

## 2020-04-01 DIAGNOSIS — R262 Difficulty in walking, not elsewhere classified: Secondary | ICD-10-CM | POA: Diagnosis not present

## 2020-04-01 DIAGNOSIS — M25552 Pain in left hip: Secondary | ICD-10-CM | POA: Diagnosis not present

## 2020-04-01 DIAGNOSIS — M79652 Pain in left thigh: Secondary | ICD-10-CM | POA: Diagnosis not present

## 2020-04-01 DIAGNOSIS — M6281 Muscle weakness (generalized): Secondary | ICD-10-CM | POA: Diagnosis not present

## 2020-04-07 DIAGNOSIS — R262 Difficulty in walking, not elsewhere classified: Secondary | ICD-10-CM | POA: Diagnosis not present

## 2020-04-07 DIAGNOSIS — M6281 Muscle weakness (generalized): Secondary | ICD-10-CM | POA: Diagnosis not present

## 2020-04-07 DIAGNOSIS — M25552 Pain in left hip: Secondary | ICD-10-CM | POA: Diagnosis not present

## 2020-04-07 DIAGNOSIS — M79652 Pain in left thigh: Secondary | ICD-10-CM | POA: Diagnosis not present

## 2020-04-08 DIAGNOSIS — M79652 Pain in left thigh: Secondary | ICD-10-CM | POA: Diagnosis not present

## 2020-04-08 DIAGNOSIS — M6281 Muscle weakness (generalized): Secondary | ICD-10-CM | POA: Diagnosis not present

## 2020-04-08 DIAGNOSIS — M25552 Pain in left hip: Secondary | ICD-10-CM | POA: Diagnosis not present

## 2020-04-08 DIAGNOSIS — R262 Difficulty in walking, not elsewhere classified: Secondary | ICD-10-CM | POA: Diagnosis not present

## 2020-04-14 DIAGNOSIS — M6281 Muscle weakness (generalized): Secondary | ICD-10-CM | POA: Diagnosis not present

## 2020-04-14 DIAGNOSIS — M79652 Pain in left thigh: Secondary | ICD-10-CM | POA: Diagnosis not present

## 2020-04-14 DIAGNOSIS — R262 Difficulty in walking, not elsewhere classified: Secondary | ICD-10-CM | POA: Diagnosis not present

## 2020-04-14 DIAGNOSIS — M25552 Pain in left hip: Secondary | ICD-10-CM | POA: Diagnosis not present

## 2020-04-16 DIAGNOSIS — M25552 Pain in left hip: Secondary | ICD-10-CM | POA: Diagnosis not present

## 2020-04-16 DIAGNOSIS — M79652 Pain in left thigh: Secondary | ICD-10-CM | POA: Diagnosis not present

## 2020-04-16 DIAGNOSIS — R262 Difficulty in walking, not elsewhere classified: Secondary | ICD-10-CM | POA: Diagnosis not present

## 2020-04-16 DIAGNOSIS — M6281 Muscle weakness (generalized): Secondary | ICD-10-CM | POA: Diagnosis not present

## 2020-04-20 DIAGNOSIS — M25552 Pain in left hip: Secondary | ICD-10-CM | POA: Diagnosis not present

## 2020-04-20 DIAGNOSIS — M48062 Spinal stenosis, lumbar region with neurogenic claudication: Secondary | ICD-10-CM | POA: Diagnosis not present

## 2020-04-20 DIAGNOSIS — M25551 Pain in right hip: Secondary | ICD-10-CM | POA: Diagnosis not present

## 2020-04-21 DIAGNOSIS — M79652 Pain in left thigh: Secondary | ICD-10-CM | POA: Diagnosis not present

## 2020-04-21 DIAGNOSIS — M6281 Muscle weakness (generalized): Secondary | ICD-10-CM | POA: Diagnosis not present

## 2020-04-21 DIAGNOSIS — R262 Difficulty in walking, not elsewhere classified: Secondary | ICD-10-CM | POA: Diagnosis not present

## 2020-04-28 DIAGNOSIS — M79652 Pain in left thigh: Secondary | ICD-10-CM | POA: Diagnosis not present

## 2020-04-28 DIAGNOSIS — M6281 Muscle weakness (generalized): Secondary | ICD-10-CM | POA: Diagnosis not present

## 2020-04-28 DIAGNOSIS — M25552 Pain in left hip: Secondary | ICD-10-CM | POA: Diagnosis not present

## 2020-04-28 DIAGNOSIS — R262 Difficulty in walking, not elsewhere classified: Secondary | ICD-10-CM | POA: Diagnosis not present

## 2020-04-30 DIAGNOSIS — M79652 Pain in left thigh: Secondary | ICD-10-CM | POA: Diagnosis not present

## 2020-04-30 DIAGNOSIS — M6281 Muscle weakness (generalized): Secondary | ICD-10-CM | POA: Diagnosis not present

## 2020-04-30 DIAGNOSIS — R262 Difficulty in walking, not elsewhere classified: Secondary | ICD-10-CM | POA: Diagnosis not present

## 2020-04-30 DIAGNOSIS — M25552 Pain in left hip: Secondary | ICD-10-CM | POA: Diagnosis not present

## 2020-05-04 DIAGNOSIS — Z20822 Contact with and (suspected) exposure to covid-19: Secondary | ICD-10-CM | POA: Diagnosis not present

## 2020-05-05 DIAGNOSIS — M48062 Spinal stenosis, lumbar region with neurogenic claudication: Secondary | ICD-10-CM | POA: Diagnosis not present

## 2020-05-05 DIAGNOSIS — Z6827 Body mass index (BMI) 27.0-27.9, adult: Secondary | ICD-10-CM | POA: Diagnosis not present

## 2020-05-05 DIAGNOSIS — R030 Elevated blood-pressure reading, without diagnosis of hypertension: Secondary | ICD-10-CM | POA: Diagnosis not present

## 2020-05-06 DIAGNOSIS — Z20822 Contact with and (suspected) exposure to covid-19: Secondary | ICD-10-CM | POA: Diagnosis not present

## 2020-05-07 ENCOUNTER — Other Ambulatory Visit: Payer: Self-pay | Admitting: Orthopedic Surgery

## 2020-05-07 ENCOUNTER — Other Ambulatory Visit (HOSPITAL_COMMUNITY): Payer: Self-pay | Admitting: Orthopedic Surgery

## 2020-05-07 DIAGNOSIS — Z96642 Presence of left artificial hip joint: Secondary | ICD-10-CM

## 2020-05-07 DIAGNOSIS — M25552 Pain in left hip: Secondary | ICD-10-CM

## 2020-05-13 DIAGNOSIS — Z96642 Presence of left artificial hip joint: Secondary | ICD-10-CM | POA: Diagnosis not present

## 2020-05-13 DIAGNOSIS — M25552 Pain in left hip: Secondary | ICD-10-CM | POA: Diagnosis not present

## 2020-05-18 ENCOUNTER — Other Ambulatory Visit: Payer: Self-pay

## 2020-05-18 ENCOUNTER — Encounter (HOSPITAL_COMMUNITY)
Admission: RE | Admit: 2020-05-18 | Discharge: 2020-05-18 | Disposition: A | Discharge status: Home / Self Care | Payer: PPO | Source: Ambulatory Visit | Attending: Orthopedic Surgery | Admitting: Orthopedic Surgery

## 2020-05-18 DIAGNOSIS — M25552 Pain in left hip: Secondary | ICD-10-CM | POA: Diagnosis not present

## 2020-05-18 DIAGNOSIS — Z96642 Presence of left artificial hip joint: Secondary | ICD-10-CM | POA: Diagnosis not present

## 2020-05-18 DIAGNOSIS — Z9889 Other specified postprocedural states: Secondary | ICD-10-CM | POA: Diagnosis not present

## 2020-05-18 DIAGNOSIS — Z471 Aftercare following joint replacement surgery: Secondary | ICD-10-CM | POA: Diagnosis not present

## 2020-05-18 IMAGING — NM NM BONE 3 PHASE
2 series · 12 of 12 positions shown · non-contrast
Comparison: None

Correlation: LEFT hip radiographs [DATE]

CLINICAL DATA: LEFT hip pain ever since LEFT hip replacement
surgery [DATE], some RIGHT hip pain as well

EXAM:
NUCLEAR MEDICINE 3-PHASE BONE SCAN
TECHNIQUE: Radionuclide angiographic images, immediate static blood pool
images, and 3-hour delayed static images were obtained of the hips
after intravenous injection of radiopharmaceutical.
RADIOPHARMACEUTICALS:  20.3 mCi [AN] MDP IV

[Series 1: flow · 2.07mm/px · 6 of 48 frames shown (1 of 2)]
[frame 5/48  full-range]
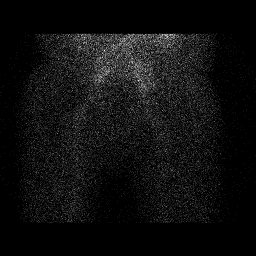
[frame 13/48  full-range]
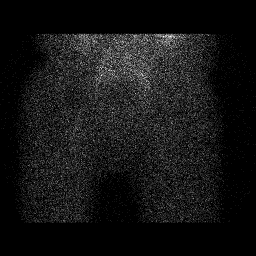
[frame 21/48  full-range]
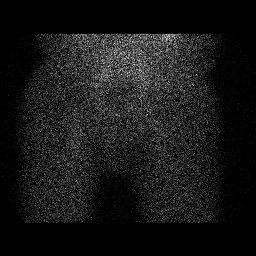
[frame 29/48  full-range]
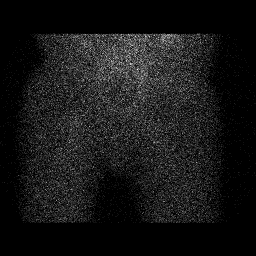
[frame 37/48  full-range]
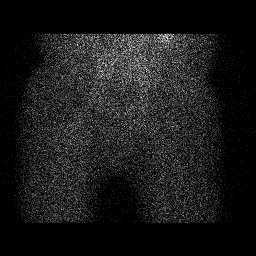
[frame 45/48  full-range]
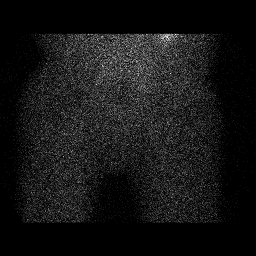

[Series 1: flow · 2.07mm/px · 6 of 48 frames shown (2 of 2)]
[frame 5/48  full-range]
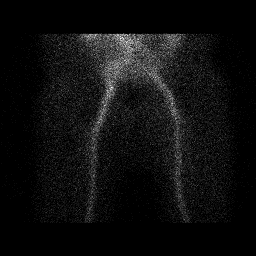
[frame 13/48  full-range]
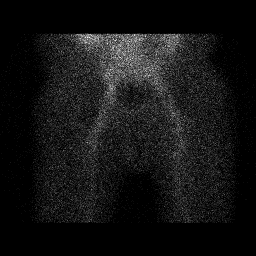
[frame 21/48  full-range]
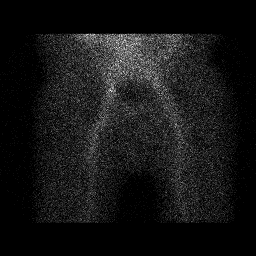
[frame 29/48  full-range]
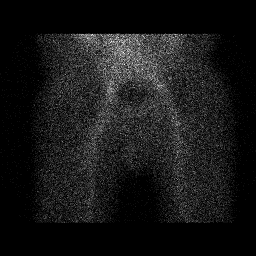
[frame 37/48  full-range]
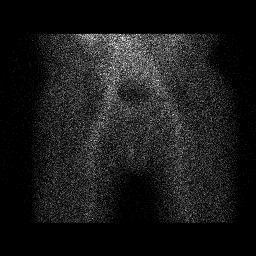
[frame 45/48  full-range]
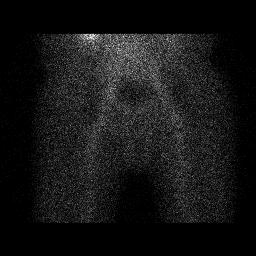

[12 of 12 positions shown; findings below may reference images not displayed]

FINDINGS: Vascular phase: Normal symmetric blood flow to both hips

Blood pool phase: Normal blood pool at both hips

Delayed phase: Photopenic defect at LEFT hip from prosthesis.
Minimal diffuse uptake of tracer at the proximal RIGHT femur
consistent with surgery. Mild diffuse uptake of tracer adjacent to
the femoral component of the LEFT hip prosthesis. No focal areas of
increased tracer accumulation are identified. Normal uptake of
tracer within the pelvis and visualized proximal RIGHT femur.
IMPRESSION: Normal blood flow and blood pool images.

LEFT hip prosthesis demonstrates mild increased tracer accumulation
adjacent to the femoral component; the mild diffuse nature of uptake
favors postsurgical changes rather than aseptic loosening or
infection.

No other scintigraphic abnormalities.

## 2020-05-18 MED ORDER — TECHNETIUM TC 99M MEDRONATE IV KIT
20.0000 | PACK | Freq: Once | INTRAVENOUS | Status: AC | PRN
  Administered 2020-05-18: 20 via INTRAVENOUS

## 2020-05-28 DIAGNOSIS — M25552 Pain in left hip: Secondary | ICD-10-CM | POA: Diagnosis not present

## 2020-06-23 DIAGNOSIS — M25552 Pain in left hip: Secondary | ICD-10-CM | POA: Diagnosis not present

## 2020-06-25 DIAGNOSIS — M25552 Pain in left hip: Secondary | ICD-10-CM | POA: Diagnosis not present

## 2020-06-30 DIAGNOSIS — M25552 Pain in left hip: Secondary | ICD-10-CM | POA: Diagnosis not present

## 2020-07-03 DIAGNOSIS — M25552 Pain in left hip: Secondary | ICD-10-CM | POA: Diagnosis not present

## 2020-07-06 DIAGNOSIS — M25552 Pain in left hip: Secondary | ICD-10-CM | POA: Diagnosis not present

## 2020-07-15 DIAGNOSIS — M25552 Pain in left hip: Secondary | ICD-10-CM | POA: Diagnosis not present

## 2020-07-17 DIAGNOSIS — M25552 Pain in left hip: Secondary | ICD-10-CM | POA: Diagnosis not present

## 2020-07-19 ENCOUNTER — Encounter: Payer: Self-pay | Admitting: Internal Medicine

## 2020-07-19 NOTE — Patient Instructions (Signed)

## 2020-07-19 NOTE — Progress Notes (Signed)
Annual  Screening/Preventative Visit  & Comprehensive Evaluation & Examination      This very nice 70 y.o. MWM presents for a Screening /Preventative Visit & comprehensive evaluation and management of multiple medical co-morbidities.  Patient has been followed for HTN, HLD, Prediabetes and Vitamin D Deficiency.  Patient is followed by Dr Clovis Pu on Depakote for bipolar disorder. Patient has OSA on CPAP.  Patient has IBS-D controlled on 1 loperamide /day.      Today patient expresses concern re: difficulty with his gait predating to Lt THA in Feb 2020 by Dr . Rod Can. Patient relates he developed a limp or what he describes as a "shuffeling" in his gait. He was referred to Dr Newman Pies and by patient's report,  he was told that he had Degenerative Disc Disease and after a bone scan & CBC were done,  he was recommended Physical Marella Chimes and that no surgery was recommended. Patient does describe difficulty with balance , but has not experienced any injuries from falls.       HTN predates circa 2012. Patient's BP has been controlled at home.  Today's BP is at goal - 126/74. Patient denies any cardiac symptoms as chest pain, palpitations, shortness of breath, dizziness or ankle swelling.      Patient's  Lipids are  controlled with diet.  Last lipids were at goal:  Lab Results  Component Value Date   CHOL 157 07/03/2019   HDL 51 07/03/2019   LDLCALC 89 07/03/2019   TRIG 77 07/03/2019   CHOLHDL 3.1 07/03/2019       Patient is monitored for glucose intolerance and patient denies reactive hypoglycemic symptoms, visual blurring, diabetic polys or paresthesias. Last A1c was at goal:   Lab Results  Component Value Date   HGBA1C 5.2 07/03/2019        Finally, patient has history of Vitamin D Deficiency ("28" /2017)and last vitamin D was at goal:   Lab Results  Component Value Date   VD25OH 77 07/03/2019    Current Outpatient Medications on File Prior to Visit  Medication Sig   . aspirin 81 MG chewable tablet Take  1 tablet  daily.  . bisoprolol-hctz  5-6.25 MG tablet TAKE 1 TABLET DAILY   . VITAMIN D 5,000 u Take 5 every evening.   . divalproex  250 MG DR tablet Take 2 tablets  2 times daily.  . naproxen 220 MG tablet Take 440 mg  2  times daily as needed     Allergies  Allergen Reactions  . Ciprofloxacin Diarrhea    Past Medical History:  Diagnosis Date  . Arthritis   . Bipolar disorder ([place])    controlled by meds since 1997  . Pneumonia   . Sleep apnea    cpap  . Wears glasses    reading   Health Maintenance  Topic Date Due  . COVID-19 Vaccine (3 - Booster for Moderna series) 01/30/2020  . COLONOSCOPY  10/29/2020  . TETANUS/TDAP  11/07/2027  . Hepatitis C Screening  Completed  . PNA vac Low Risk Adult  Completed  . INFLUENZA VACCINE  Discontinued   Immunization History  Administered Date(s) Administered  . Moderna Sars-Covid-2 Vaccination 07/02/2019, 07/30/2019  . Pneumococcal Conjugate-13 04/05/2017  . Pneumococcal Polysaccharide-23 11/22/2018  . Td 11/06/2017   Last Colon -  10/30/2015 - Dr Deborha Payment recc 5 yr f/u  due June 2023  Past Surgical History:  Procedure Laterality Date  . APPENDECTOMY    . COLONOSCOPY    .  KNEE ARTHROSCOPY W/ MENISCAL REPAIR  2004   left  . ORIF HUMERUS FRACTURE Left 09/19/2013   Procedure: LEFT OPEN REDUCTION INTERNAL FIXATION (ORIF) PROXIMAL HUMERUS; ROTATOR CUFF REPAIR;  Surgeon: [name], MD;  Location: [facility];  Service: Orthopedics;  Laterality: Left;  . TONSILLECTOMY    . TOTAL HIP ARTHROPLASTY Left 07/18/2018   Procedure: TOTAL HIP ARTHROPLASTY ANTERIOR APPROACH;  Surgeon: Rod Can, MD;  Location: [facility];  Service: Orthopedics;  Laterality: Left;    Social History   Socioeconomic History  . Marital status: Married    Spouse name: [name]   . Number of children: 4 children & 11 Grandchildren    Occupational History  . Attorney  Tobacco Use  . Smoking status:  Never Smoker  . Smokeless tobacco: Never Used  Vaping Use  . Vaping Use: Never used  Substance and Sexual Activity  . Alcohol use: Yes    Comment: 5 days a week  . Drug use: No  . Sexual activity: Yes   ROS Constitutional: Denies fever, chills, weight loss/gain, headaches, insomnia,  night sweats or change in appetite. Does c/o fatigue. Eyes: Denies redness, blurred vision, diplopia, discharge, itchy or watery eyes.  ENT: Denies discharge, congestion, post nasal drip, epistaxis, sore throat, earache, hearing loss, dental pain, Tinnitus, Vertigo, Sinus pain or snoring.  Cardio: Denies chest pain, palpitations, irregular heartbeat, syncope, dyspnea, diaphoresis, orthopnea, PND, claudication or edema Respiratory: denies cough, dyspnea, DOE, pleurisy, hoarseness, laryngitis or wheezing.  Gastrointestinal: Denies dysphagia, heartburn, reflux, water brash, pain, cramps, nausea, vomiting, bloating, diarrhea, constipation, hematemesis, melena, hematochezia, jaundice or hemorrhoids Genitourinary: Denies dysuria, frequency, urgency, nocturia, hesitancy, discharge, hematuria or flank pain Musculoskeletal: Denies arthralgia, myalgia, stiffness, Jt. Swelling, pain, limp or strain/sprain. Denies Falls. Skin: Denies puritis, rash, hives, warts, acne, eczema or change in skin lesion Neuro: No weakness, tremor, incoordination, spasms, paresthesia or pain Psychiatric: Denies confusion, memory loss or sensory loss. Denies Depression. Endocrine: Denies change in weight, skin, hair change, nocturia, and paresthesia, diabetic polys, visual blurring or hyper / hypo glycemic episodes.  Heme/Lymph: No excessive bleeding, bruising or enlarged lymph nodes.  Physical Exam  BP 126/74   Pulse 61   Temp (!) 97.1 F (36.2 C)   Resp 16   Ht 5\' 9"  (1.753 m)   Wt 183 lb (83 kg)   SpO2 98%   BMI 27.02 kg/m   General Appearance: Well nourished and well groomed and in no apparent distress.  Eyes: PERRLA, EOMs,  conjunctiva no swelling or erythema, normal fundi and vessels. Sinuses: No frontal/maxillary tenderness ENT/Mouth: EACs patent / TMs  nl. Nares clear without erythema, swelling, mucoid exudates. Oral hygiene is good. No erythema, swelling, or exudate. Tongue normal, non-obstructing. Tonsils not swollen or erythematous. Hearing normal.  Neck: Supple, thyroid not palpable. No bruits, nodes or JVD. Respiratory: Respiratory effort normal.  BS equal and clear bilateral without rales, rhonci, wheezing or stridor. Cardio: Heart sounds are normal with regular rate and rhythm and no murmurs, rubs or gallops. Peripheral pulses are normal and equal bilaterally without edema. No aortic or femoral bruits. Chest: symmetric with normal excursions and percussion.  Abdomen: Soft, with Nl bowel sounds. Nontender, no guarding, rebound, hernias, masses, or organomegaly.  Lymphatics: Non tender without lymphadenopathy.  Musculoskeletal: Full ROM all peripheral extremities, joint stability, 5/5 strength, and sl broad based gait. Poor or unstable tandem walk. Cannot balance on either foot. No cog-wheeling.  Skin: Warm and dry without rashes, lesions, cyanosis, clubbing or  ecchymosis.  Neuro: Cranial nerves intact, reflexes equal bilaterally. Normal muscle tone, no cerebellar symptoms. Sensation intact. (+) Romberg. No tremor. Nl F- >N. Pysch: Alert and oriented X 3 with normal affect, insight and judgment appropriate.   Assessment and Plan  1. Annual Preventative/Screening Exam    2. Essential hypertension  - EKG 12-Lead - [place], RETROPERITNL ABD,  LTD - Urinalysis, Routine w reflex microscopic - Microalbumin / creatinine urine ratio - CBC with Differential/Platelet - COMPLETE METABOLIC PANEL WITH GFR - Magnesium - TSH  3. Hyperlipidemia, mixed  - EKG 12-Lead - [place], RETROPERITNL ABD,  LTD - Lipid panel - TSH  4. Abnormal glucose  - EKG 12-Lead - [place], RETROPERITNL ABD,  LTD - Hemoglobin A1c - Insulin,  random  5. Vitamin D deficiency  - VITAMIN D 25 Hydroxy  6. OSA on CPAP   7. Bipolar I disorder (H[facility])  - Valproic acid level  8. BPH with obstruction/lower urinary tract symptoms  - PSA  9. Screening for colorectal cancer  - POC Hemoccult Bld/Stl   10. Prostate cancer screening  - PSA  11. Screening for ischemic heart disease  - EKG 12-Lead  12. Screening for AAA (aortic abdominal aneurysm)  - [place], RETROPERITNL ABD,  LTD  13. Unstable Gait   - Neurology referral   14. Medication management  - Urinalysis, Routine w reflex microscopic - Microalbumin / creatinine urine ratio - CBC with Differential/Platelet - COMPLETE METABOLIC PANEL WITH GFR - Magnesium - Lipid panel - TSH - Hemoglobin A1c - Insulin, random - VITAMIN D 25 Hydroxy - Valproic acid level        Patient was counseled in prudent diet, weight control to achieve/maintain BMI less than 25, BP monitoring, regular exercise and medications as discussed.  Discussed med effects and SE's. Routine screening labs and tests as requested with regular follow-up as recommended. Over 40 minutes of exam, counseling, chart review and high complex critical decision making was performed   Kirtland Bouchard, MD

## 2020-07-20 ENCOUNTER — Ambulatory Visit (INDEPENDENT_AMBULATORY_CARE_PROVIDER_SITE_OTHER): Payer: PPO | Admitting: Internal Medicine

## 2020-07-20 ENCOUNTER — Other Ambulatory Visit: Payer: Self-pay

## 2020-07-20 VITALS — BP 126/74 | HR 61 | Temp 97.1°F | Resp 16 | Ht 69.0 in | Wt 183.0 lb

## 2020-07-20 DIAGNOSIS — E559 Vitamin D deficiency, unspecified: Secondary | ICD-10-CM | POA: Diagnosis not present

## 2020-07-20 DIAGNOSIS — F319 Bipolar disorder, unspecified: Secondary | ICD-10-CM

## 2020-07-20 DIAGNOSIS — G4733 Obstructive sleep apnea (adult) (pediatric): Secondary | ICD-10-CM

## 2020-07-20 DIAGNOSIS — Z0001 Encounter for general adult medical examination with abnormal findings: Secondary | ICD-10-CM

## 2020-07-20 DIAGNOSIS — E782 Mixed hyperlipidemia: Secondary | ICD-10-CM

## 2020-07-20 DIAGNOSIS — Z0000 Encounter for general adult medical examination without abnormal findings: Secondary | ICD-10-CM | POA: Diagnosis not present

## 2020-07-20 DIAGNOSIS — N401 Enlarged prostate with lower urinary tract symptoms: Secondary | ICD-10-CM | POA: Diagnosis not present

## 2020-07-20 DIAGNOSIS — Z136 Encounter for screening for cardiovascular disorders: Secondary | ICD-10-CM | POA: Diagnosis not present

## 2020-07-20 DIAGNOSIS — N138 Other obstructive and reflux uropathy: Secondary | ICD-10-CM | POA: Diagnosis not present

## 2020-07-20 DIAGNOSIS — R7309 Other abnormal glucose: Secondary | ICD-10-CM

## 2020-07-20 DIAGNOSIS — Z1211 Encounter for screening for malignant neoplasm of colon: Secondary | ICD-10-CM

## 2020-07-20 DIAGNOSIS — Z125 Encounter for screening for malignant neoplasm of prostate: Secondary | ICD-10-CM

## 2020-07-20 DIAGNOSIS — Z9989 Dependence on other enabling machines and devices: Secondary | ICD-10-CM

## 2020-07-20 DIAGNOSIS — I10 Essential (primary) hypertension: Secondary | ICD-10-CM | POA: Diagnosis not present

## 2020-07-20 DIAGNOSIS — Z79899 Other long term (current) drug therapy: Secondary | ICD-10-CM | POA: Diagnosis not present

## 2020-07-20 DIAGNOSIS — R2681 Unsteadiness on feet: Secondary | ICD-10-CM

## 2020-07-20 NOTE — Progress Notes (Signed)
AortaScan < 3 cm. Within normal limits, per Dr McKeown. 

## 2020-07-21 ENCOUNTER — Encounter: Payer: Self-pay | Admitting: Neurology

## 2020-07-21 DIAGNOSIS — M25552 Pain in left hip: Secondary | ICD-10-CM | POA: Diagnosis not present

## 2020-07-21 LAB — COMPLETE METABOLIC PANEL WITH GFR
AG Ratio: 1.6 (calc) (ref 1.0–2.5)
ALT: 13 U/L (ref 9–46)
AST: 15 U/L (ref 10–35)
Albumin: 4.1 g/dL (ref 3.6–5.1)
Alkaline phosphatase (APISO): 42 U/L (ref 35–144)
BUN/Creatinine Ratio: 29 (calc) — ABNORMAL HIGH (ref 6–22)
BUN: 28 mg/dL — ABNORMAL HIGH (ref 7–25)
CO2: 30 mmol/L (ref 20–32)
Calcium: 8.9 mg/dL (ref 8.6–10.3)
Chloride: 100 mmol/L (ref 98–110)
Creat: 0.96 mg/dL (ref 0.70–1.25)
GFR, Est African American: 93 mL/min/{1.73_m2} (ref >=60)
GFR, Est Non African American: 80 mL/min/{1.73_m2} (ref >=60)
Globulin: 2.5 g/dL (calc) (ref 1.9–3.7)
Glucose, Bld: 79 mg/dL (ref 65–99)
Potassium: 4.4 mmol/L (ref 3.5–5.3)
Sodium: 137 mmol/L (ref 135–146)
Total Bilirubin: 0.5 mg/dL (ref 0.2–1.2)
Total Protein: 6.6 g/dL (ref 6.1–8.1)

## 2020-07-21 LAB — CBC WITH DIFFERENTIAL/PLATELET
Absolute Monocytes: 872 cells/uL (ref 200–950)
Basophils Absolute: 29 cells/uL (ref 0–200)
Basophils Relative: 0.3 %
Eosinophils Absolute: 382 cells/uL (ref 15–500)
Eosinophils Relative: 3.9 %
HCT: 39.6 % (ref 38.5–50.0)
Hemoglobin: 13.3 g/dL (ref 13.2–17.1)
Lymphs Abs: 2891 cells/uL (ref 850–3900)
MCH: 31.4 pg (ref 27.0–33.0)
MCHC: 33.6 g/dL (ref 32.0–36.0)
MCV: 93.4 fL (ref 80.0–100.0)
MPV: 11.5 fL (ref 7.5–12.5)
Monocytes Relative: 8.9 %
Neutro Abs: 5625 cells/uL (ref 1500–7800)
Neutrophils Relative %: 57.4 %
Platelets: 168 10*3/uL (ref 140–400)
RBC: 4.24 10*6/uL (ref 4.20–5.80)
RDW: 12.9 % (ref 11.0–15.0)
Total Lymphocyte: 29.5 %
WBC: 9.8 10*3/uL (ref 3.8–10.8)

## 2020-07-21 LAB — URINALYSIS, ROUTINE W REFLEX MICROSCOPIC
Bilirubin Urine: NEGATIVE
Glucose, UA: NEGATIVE
Hgb urine dipstick: NEGATIVE
Leukocytes,Ua: NEGATIVE
Nitrite: NEGATIVE
Protein, ur: NEGATIVE
Specific Gravity, Urine: 1.025 (ref 1.001–1.03)
pH: 5.5 (ref 5.0–8.0)

## 2020-07-21 LAB — LIPID PANEL
Cholesterol: 165 mg/dL (ref <200)
HDL: 60 mg/dL (ref >=40)
LDL Cholesterol (Calc): 88 mg/dL (calc)
Non-HDL Cholesterol (Calc): 105 mg/dL (calc) (ref <130)
Total CHOL/HDL Ratio: 2.8 (calc) (ref <5.0)
Triglycerides: 78 mg/dL (ref <150)

## 2020-07-21 LAB — TSH: TSH: 0.54 mIU/L (ref 0.40–4.50)

## 2020-07-21 LAB — INSULIN, RANDOM: Insulin: 2.8 u[IU]/mL

## 2020-07-21 LAB — MICROALBUMIN / CREATININE URINE RATIO
Creatinine, Urine: 142 mg/dL (ref 20–320)
Microalb Creat Ratio: 4 ug/mg{creat}
Microalb, Ur: 0.5 mg/dL

## 2020-07-21 LAB — HEMOGLOBIN A1C
Hgb A1c MFr Bld: 5 % of total Hgb (ref <5.7)
Mean Plasma Glucose: 97 mg/dL
eAG (mmol/L): 5.4 mmol/L

## 2020-07-21 LAB — MAGNESIUM: Magnesium: 1.9 mg/dL (ref 1.5–2.5)

## 2020-07-21 LAB — VITAMIN D 25 HYDROXY (VIT D DEFICIENCY, FRACTURES): Vit D, 25-Hydroxy: 77 ng/mL (ref 30–100)

## 2020-07-21 LAB — PSA: PSA: 1.16 ng/mL (ref <=4.0)

## 2020-07-21 LAB — VALPROIC ACID LEVEL: Valproic Acid Lvl: 47.1 mg/L — ABNORMAL LOW (ref 50.0–100.0)

## 2020-07-21 NOTE — Progress Notes (Signed)
============================================================ -   Test results slightly outside the reference range are not unusual. If there is anything important, I will review this with you,  otherwise it is considered normal test values.  If you have further questions,  please do not hesitate to contact me at the office or via My Chart.  ============================================================ ============================================================  - Depakote level  (Divalproex ) - borderline low,                                                                   But consistent with previous levels   - Will forward to Dr Clovis Pu   ============================================================ ============================================================  - Total Chol = 165   and LDL Chol = 88 - Both  Excellent   - Very low risk for Heart Attack  / Stroke ========================================================  - A1c - Normal - No Diabetes - Great !  ============================================================ ============================================================  - Vitamin D = 77  - Excellent  ============================================================ ============================================================  - All Else - CBC - Kidneys - Electrolytes - Liver - Magnesium & Thyroid    - all  Normal / OK ===========================================================

## 2020-07-28 ENCOUNTER — Ambulatory Visit: Payer: PRIVATE HEALTH INSURANCE | Admitting: Psychiatry

## 2020-08-10 DIAGNOSIS — M25552 Pain in left hip: Secondary | ICD-10-CM | POA: Diagnosis not present

## 2020-08-14 DIAGNOSIS — M25552 Pain in left hip: Secondary | ICD-10-CM | POA: Diagnosis not present

## 2020-08-24 ENCOUNTER — Other Ambulatory Visit: Payer: Self-pay

## 2020-08-24 ENCOUNTER — Ambulatory Visit (INDEPENDENT_AMBULATORY_CARE_PROVIDER_SITE_OTHER): Payer: PRIVATE HEALTH INSURANCE | Admitting: Psychiatry

## 2020-08-24 ENCOUNTER — Encounter: Payer: Self-pay | Admitting: Psychiatry

## 2020-08-24 DIAGNOSIS — F319 Bipolar disorder, unspecified: Secondary | ICD-10-CM

## 2020-08-24 NOTE — Progress Notes (Signed)
Timothy [name] [id number] [birth year] 70 y.o.  Subjective:   Patient ID:  Timothy [name] is a 70 y.o. (DOB Dec 12, 1950) male.  Chief Complaint:  Chief Complaint  Patient presents with  . Follow-up  . Stress    marriage    HPI Timothy [name] presents to the office today for follow-up of bipolar disorder and has been on Depakote DR 500 mg twice daily for many years.  seen 10/2018.  No meds changed.  Typically seen yearly in recent years because of long-term stability  01/23/20 appt with the following noted: Covid free. Health good.  Sleep 6 1/2 hours with CPAP.  Alert daytime. Overall doing alright mood.  Hip replacement gone bad after 1 and 1/2 year.  Appt Tuesday.  Function otherwise OK. Doesn't plan to retire so still working. No SE. Plan no med changes  08/24/2020 appointment with the following noted: Sep end of December from [place], married less than 2 years.  She's in counseling.  Is being told  This is stressful.  Have seen each other and vacationed together but not living together.  Would like to find out if he has narcissistic tendencies bc she has said this.  She's written hateful things about him.  Asks for therapist to help him figure out if he has personality disorder.  Mood stable and good despite stressors.  Patient reports stable mood and denies depressed or irritable moods.  Patient denies any recent difficulty with anxiety.  Patient denies difficulty with sleep initiation or maintenance. Denies appetite disturbance.  Patient reports that energy and motivation have been good.  Patient denies any difficulty with concentration.  Patient denies any suicidal ideation.   Past Psychiatric Medication Trials: Depakote 500 mg twice daily,  Ambien, ProSom, lorazepam,  Effexor, paroxetine 20,  He has been on Depakote alone at 7 52,000 mg daily since 1998  Review of Systems:  Review of Systems  Musculoskeletal: Positive for arthralgias and gait problem. Negative for back pain.   Neurological: Negative for tremors and weakness.  Hip replacement 06/2018 CPAP since [facility] July 2019. Going well.  Medications: I have reviewed the patient's current medications.  Current Outpatient Medications  Medication Sig Dispense Refill  . Ascorbic Acid (VITAMIN C PO) Take 1 tablet by mouth daily.    Marland Kitchen aspirin EC 81 MG tablet Take 81 mg by mouth daily. Swallow whole.    . bisoprolol-hydrochlorothiazide (ZIAC) 5-6.25 MG tablet TAKE 1 TABLET DAILY FOR BLOOD PRESSURE 90 tablet 3  . Cholecalciferol (VITAMIN D-3) 125 MCG (5000 UT) TABS Take 5,000 Units by mouth every evening.     . divalproex (DEPAKOTE) 250 MG DR tablet Take 2 tablets (500 mg total) by mouth 2 (two) times daily. 360 tablet 3   No current facility-administered medications for this visit.    Medication Side Effects: None  Allergies:  Allergies  Allergen Reactions  . Ciprofloxacin Diarrhea    Past Medical History:  Diagnosis Date  . Arthritis   . Bipolar disorder ([place])    controlled by meds since 1997  . Pneumonia   . Sleep apnea    cpap  . Wears glasses    reading    History reviewed. No pertinent family history.  Social History   Socioeconomic History  . Marital status: Married    Spouse name: Not on file  . Number of children: Not on file  . Years of education: Not on file  . Highest education level: Not on file  Occupational History  . Not on file  Tobacco Use  . Smoking status: Never Smoker  . Smokeless tobacco: Never Used  Vaping Use  . Vaping Use: Never used  Substance and Sexual Activity  . Alcohol use: Yes    Comment: 5 days a week  . Drug use: No  . Sexual activity: Yes  Other Topics Concern  . Not on file  Social History Narrative  . Not on file   Social Determinants of Health   Financial Resource Strain: Not on file  Food Insecurity: Not on file  Transportation Needs: Not on file  Physical Activity: Not on file  Stress: Not on file  Social Connections: Not on file   Intimate Partner Violence: Not on file    Past Medical History, Surgical history, Social history, and Family history were reviewed and updated as appropriate.   Please see review of systems for further details on the patient's review from today.   Objective:   Physical Exam:  There were no vitals taken for this visit.  Physical Exam Constitutional:      General: He is not in acute distress.    Appearance: He is well-developed.  Musculoskeletal:        General: No deformity.  Neurological:     Mental Status: He is alert and oriented to person, place, and time.     Coordination: Coordination normal.  Psychiatric:        Attention and Perception: Attention normal. He is attentive. He does not perceive auditory hallucinations.        Mood and Affect: Mood normal. Mood is not anxious or depressed. Affect is not labile, blunt, angry, tearful or inappropriate.        Speech: Speech normal. Speech is not rapid and pressured.        Behavior: Behavior normal.        Thought Content: Thought content normal. Thought content does not include homicidal or suicidal ideation. Thought content does not include homicidal or suicidal plan.        Cognition and Memory: Cognition normal.        Judgment: Judgment normal.     Comments: Insight is good.     Lab Review:     Component Value Date/Time   NA 137 07/20/2020 1539   K 4.4 07/20/2020 1539   CL 100 07/20/2020 1539   CO2 30 07/20/2020 1539   GLUCOSE 79 07/20/2020 1539   BUN 28 (H) 07/20/2020 1539   CREATININE 0.96 07/20/2020 1539   CALCIUM 8.9 07/20/2020 1539   PROT 6.6 07/20/2020 1539   ALBUMIN 4.1 09/01/2016 1519   AST 15 07/20/2020 1539   ALT 13 07/20/2020 1539   ALKPHOS 45 09/01/2016 1519   BILITOT 0.5 07/20/2020 1539   GFRNONAA 80 07/20/2020 1539   GFRAA 93 07/20/2020 1539       Component Value Date/Time   WBC 9.8 07/20/2020 1539   RBC 4.24 07/20/2020 1539   HGB 13.3 07/20/2020 1539   HCT 39.6 07/20/2020 1539   PLT  168 07/20/2020 1539   MCV 93.4 07/20/2020 1539   MCH 31.4 07/20/2020 1539   MCHC 33.6 07/20/2020 1539   RDW 12.9 07/20/2020 1539   LYMPHSABS 2,891 07/20/2020 1539   MONOABS 721 07/21/2016 1630   EOSABS 382 07/20/2020 1539   BASOSABS 29 07/20/2020 1539    No results found for: POCLITH, LITHIUM   Lab Results  Component Value Date   VALPROATE 47.1 (L) 07/20/2020     .res Assessment: Plan:    Bipolar I  disorder ([place])   No change indicated.  Good response.  Labs per PCP yearly - good as noted above incl LFT's and CBC and VPA usually low normal and working.  FU 1 year.  Lynder Parents, MD, DFAPA  Please see After Visit Summary for patient specific instructions.  Future Appointments  Date Time Provider [place]  09/24/2020  9:10 AM Pieter Partridge, DO [facility] None  11/19/2020  8:30 AM Ward Givens, NP [facility] None  12/07/2020  3:00 PM Liane Comber, NP [facility] None  07/27/2021  3:00 PM [name], MD [facility] None    No orders of the defined types were placed in this encounter.     -------------------------------

## 2020-09-01 DIAGNOSIS — M48062 Spinal stenosis, lumbar region with neurogenic claudication: Secondary | ICD-10-CM | POA: Diagnosis not present

## 2020-09-03 DIAGNOSIS — M25552 Pain in left hip: Secondary | ICD-10-CM | POA: Diagnosis not present

## 2020-09-07 DIAGNOSIS — M25552 Pain in left hip: Secondary | ICD-10-CM | POA: Diagnosis not present

## 2020-09-08 ENCOUNTER — Other Ambulatory Visit: Payer: Self-pay | Admitting: Student

## 2020-09-08 DIAGNOSIS — M48062 Spinal stenosis, lumbar region with neurogenic claudication: Secondary | ICD-10-CM

## 2020-09-14 ENCOUNTER — Telehealth: Payer: Self-pay

## 2020-09-14 NOTE — Telephone Encounter (Signed)
Spoke with patient for [place] to screen his medications before getting him scheduled for a lumbar myelogram.  He stated an understanding that he will be here two hours, will need a driver and will need to be on strict bedrest (explained) for 24 hours.

## 2020-09-22 NOTE — Progress Notes (Signed)
NEUROLOGY CONSULTATION NOTE  Timothy [name] MRN: [id number] DOB: July 05, 1950  Referring provider: [name], MD Primary care provider: [name], MD  Reason for consult:    Assessment/Plan:   1.  Reports gradually worsening bilateral leg weakness, although left hip flexion is objectively most prominent. 2.  History of left total hip replacement  1.  NCV-EMG lower extremities. 2.  Further recommendations pending results.    Subjective:  Timothy [name] is a 70 year old male with HTN, prediabetes, OSA on CPAP, IBS and Bipolar Disorder who presents for unsteady gait.  History supplemented by referring provider's note.  He had left total hip replacement in February 2020.  Following thi procedure, he continued to experience left hip pain, causing him to limp.  He slowly improved but after a few months gradually felt weaker again.  He has some localized back pain but denies numbness and pain in the legs.  He is able to stand up from a chair but reports effort.  When he went snorkeling last summer, he had trouble kicking his legs.  Denies change in bowel or bladder function.  Denies perineal numbness.  Upper extremities feel fine.  Denies double vision, neck weakness, dysphagia or dyspnea.  He returned to his orthopedist, who told him that his hip was okay.  He was referred to neurosurgeon who told him that he had degenerative disc disease and recommended physical therapy but that surgery was not indicated.  He continues to have low back pain and left hip pain, which he treats with Aleve.  He feels both legs are weak, left greater than right.  No numbness or radicular pain down the leg.  He received L5-S1 interlaminar injection in December 2021 without relief.  He followed back up with neurosurgery earlier this month.  Lumbar CT myelogram is ordered for next week.       PAST MEDICAL HISTORY: Past Medical History:  Diagnosis Date  . Arthritis   . Bipolar disorder ([place])    controlled by  meds since 1997  . Pneumonia   . Sleep apnea    cpap  . Wears glasses    reading    PAST SURGICAL HISTORY: Past Surgical History:  Procedure Laterality Date  . APPENDECTOMY    . COLONOSCOPY    . KNEE ARTHROSCOPY W/ MENISCAL REPAIR  2004   left  . ORIF HUMERUS FRACTURE Left 09/19/2013   Procedure: LEFT OPEN REDUCTION INTERNAL FIXATION (ORIF) PROXIMAL HUMERUS; ROTATOR CUFF REPAIR;  Surgeon: [name], MD;  Location: [place];  Service: Orthopedics;  Laterality: Left;  . TONSILLECTOMY    . TOTAL HIP ARTHROPLASTY Left 07/18/2018   Procedure: TOTAL HIP ARTHROPLASTY ANTERIOR APPROACH;  Surgeon: Rod Can, MD;  Location: [facility];  Service: Orthopedics;  Laterality: Left;    MEDICATIONS: Current Outpatient Medications on File Prior to Visit  Medication Sig Dispense Refill  . Ascorbic Acid (VITAMIN C PO) Take 1 tablet by mouth daily.    Marland Kitchen aspirin EC 81 MG tablet Take 81 mg by mouth daily. Swallow whole.    . bisoprolol-hydrochlorothiazide (ZIAC) 5-6.25 MG tablet TAKE 1 TABLET DAILY FOR BLOOD PRESSURE 90 tablet 3  . Cholecalciferol (VITAMIN D-3) 125 MCG (5000 UT) TABS Take 5,000 Units by mouth every evening.     . divalproex (DEPAKOTE) 250 MG DR tablet Take 2 tablets (500 mg total) by mouth 2 (two) times daily. 360 tablet 3   No current facility-administered medications on file prior to visit.  ALLERGIES: Allergies  Allergen Reactions  . Ciprofloxacin Diarrhea    FAMILY HISTORY: No family history on file.  Objective:  Blood pressure 136/74, pulse 67, height 5\' 9"  (1.753 m), weight 183 lb 9.6 oz (83.3 kg), SpO2 96 %. General: No acute distress.  Patient appears well-groomed.   Head:  Normocephalic/atraumatic Eyes:  fundi examined but not visualized Neck: supple, no paraspinal tenderness, full range of motion Back: No paraspinal tenderness Heart: regular rate and rhythm Lungs: Clear to auscultation bilaterally. Vascular: No carotid  bruits. Neurological Exam: Mental status: alert and oriented to person, place, and time, recent and remote memory intact, fund of knowledge intact, attention and concentration intact, speech fluent and not dysarthric, language intact. Cranial nerves: CN I: not tested CN II: pupils equal, round and reactive to light, visual fields intact CN III, IV, VI:  full range of motion, no nystagmus, no ptosis CN V: facial sensation intact. CN VII: upper and lower face symmetric CN VIII: hearing intact CN IX, X: gag intact, uvula midline CN XI: sternocleidomastoid and trapezius muscles intact CN XII: tongue midline Bulk & Tone: normal, no fasciculations. Motor:  muscle strength 4+/5 left hip flexion and left arm abduction (history of fractured shoulder) but otherwise 5/5 throughout Sensation:  Pinprick and vibratory sensation intact. Deep Tendon Reflexes:  2+ throughout,  toes downgoing.   Finger to nose testing:  Without dysmetria.   Heel to shin:  Without dysmetria.   Gait:  Mildly wide based gait with slight left limp.  Able to walk on heels and toes.  Unable to tandem walk.  Romberg negative.    Thank you for allowing me to take part in the care of this patient.  Metta Clines, DO  CC: [name], MD

## 2020-09-24 ENCOUNTER — Other Ambulatory Visit: Payer: Self-pay

## 2020-09-24 ENCOUNTER — Ambulatory Visit (INDEPENDENT_AMBULATORY_CARE_PROVIDER_SITE_OTHER): Payer: PRIVATE HEALTH INSURANCE | Admitting: Neurology

## 2020-09-24 ENCOUNTER — Encounter: Payer: Self-pay | Admitting: Neurology

## 2020-09-24 VITALS — BP 136/74 | HR 67 | Ht 69.0 in | Wt 183.6 lb

## 2020-09-24 DIAGNOSIS — R29898 Other symptoms and signs involving the musculoskeletal system: Secondary | ICD-10-CM | POA: Diagnosis not present

## 2020-09-24 NOTE — Patient Instructions (Signed)
Nerve conduction study of both legs Follow up afterwards.  Further recommendations pending results.

## 2020-09-25 ENCOUNTER — Ambulatory Visit (INDEPENDENT_AMBULATORY_CARE_PROVIDER_SITE_OTHER): Payer: PRIVATE HEALTH INSURANCE | Admitting: Psychiatry

## 2020-09-25 DIAGNOSIS — F319 Bipolar disorder, unspecified: Secondary | ICD-10-CM | POA: Diagnosis not present

## 2020-09-25 DIAGNOSIS — Z630 Problems in relationship with spouse or partner: Secondary | ICD-10-CM | POA: Diagnosis not present
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
## 2020-10-01 ENCOUNTER — Telehealth: Payer: Self-pay

## 2020-10-01 NOTE — Telephone Encounter (Signed)
Patient has called extremely agitated. Pt's PAP machine stopped working in the middle of the night and is calling us to provide him with new equipment. Tried explaining to him that we can't supply equipment, we are just the testing facility. I had to ask patient to calm down so that I could best understand him and best help him. Found out pt is with [employer] and gave him their telephone number to call. Pt is to contact them and we will to hear from them on what they may need from our end.

## 2020-10-02 ENCOUNTER — Ambulatory Visit
Admission: RE | Admit: 2020-10-02 | Discharge: 2020-10-02 | Disposition: A | Discharge status: Home / Self Care | Payer: PPO | Source: Ambulatory Visit | Attending: Student | Admitting: Student

## 2020-10-02 ENCOUNTER — Other Ambulatory Visit: Payer: Self-pay

## 2020-10-02 DIAGNOSIS — M48062 Spinal stenosis, lumbar region with neurogenic claudication: Secondary | ICD-10-CM

## 2020-10-02 DIAGNOSIS — M5136 Other intervertebral disc degeneration, lumbar region: Secondary | ICD-10-CM | POA: Diagnosis not present

## 2020-10-02 DIAGNOSIS — R531 Weakness: Secondary | ICD-10-CM | POA: Diagnosis not present

## 2020-10-02 DIAGNOSIS — M48061 Spinal stenosis, lumbar region without neurogenic claudication: Secondary | ICD-10-CM | POA: Diagnosis not present

## 2020-10-02 IMAGING — CR DG MYELOGRAPHY LUMBAR INJ LUMBOSACRAL
12 of 13 series · 13 of 14 positions shown · non-contrast
Comparison: MRI [DATE]

CLINICAL DATA: Unexplained bilateral leg weakness, left worse than
right.
TECHNIQUE: Contiguous axial images were obtained through the Lumbar spine after
the intrathecal infusion of infusion. Coronal and sagittal
reconstructions were obtained of the axial image sets.

[vasc adipose (1 of 9)]
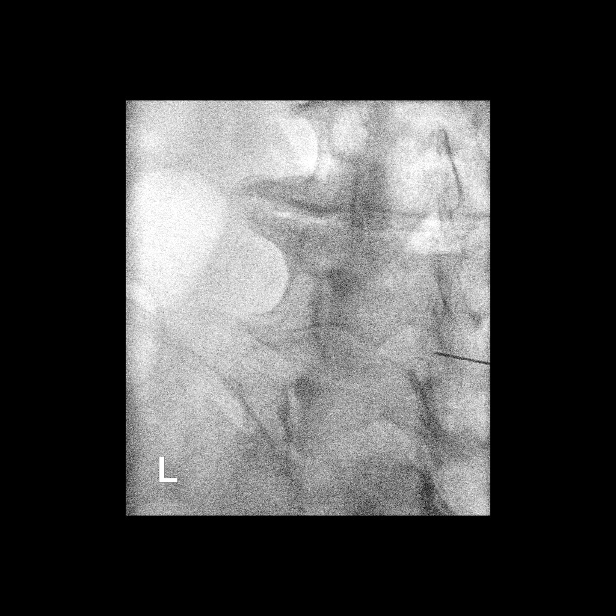

[w lumbar spine lat]
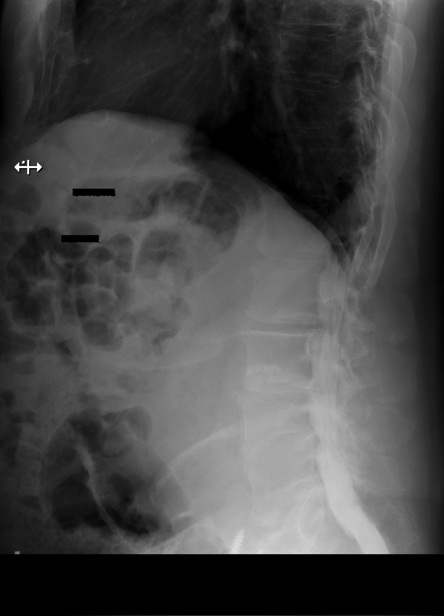

[w lumbar spine flexion]
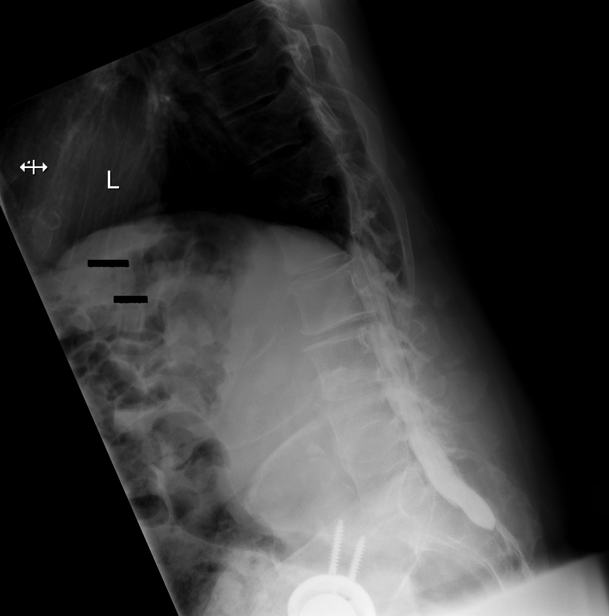

[w lumbar spine extension]
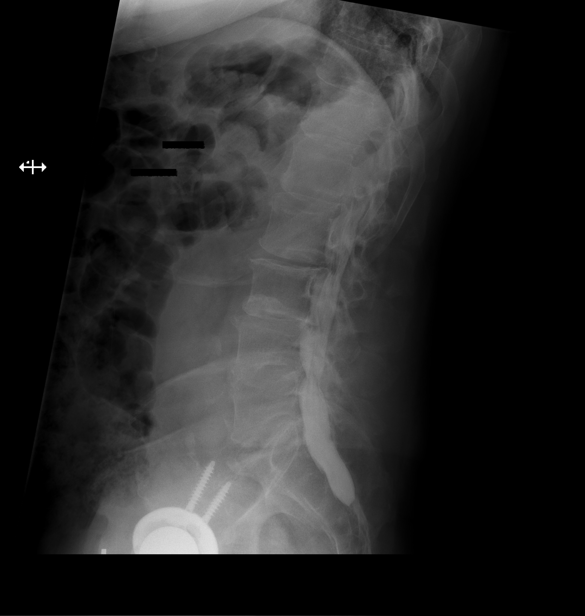

[Series 3: vasc adipose · 2 of 2 slices shown (2 of 9)]
[im 1/2]
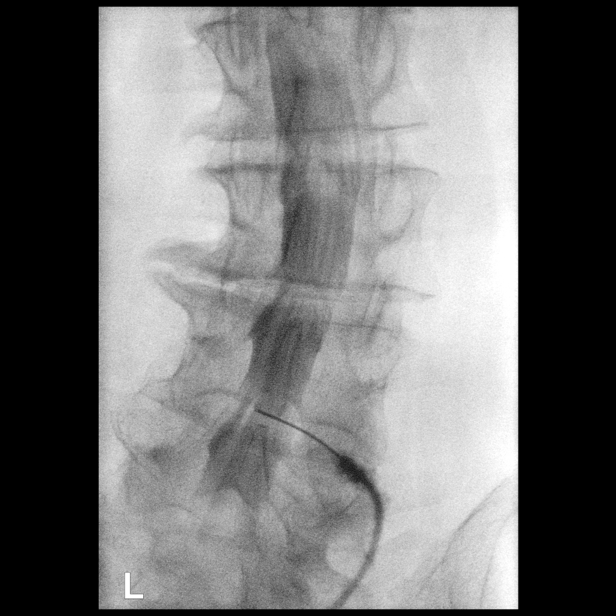
[im 2/2]
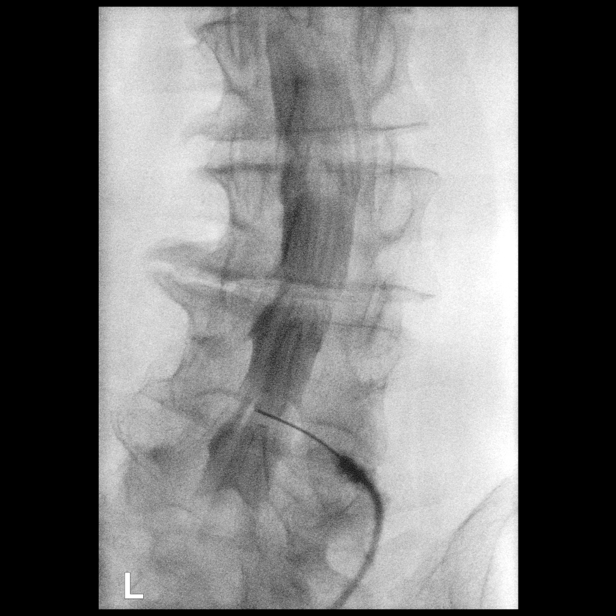

[vasc adipose (3 of 9)]
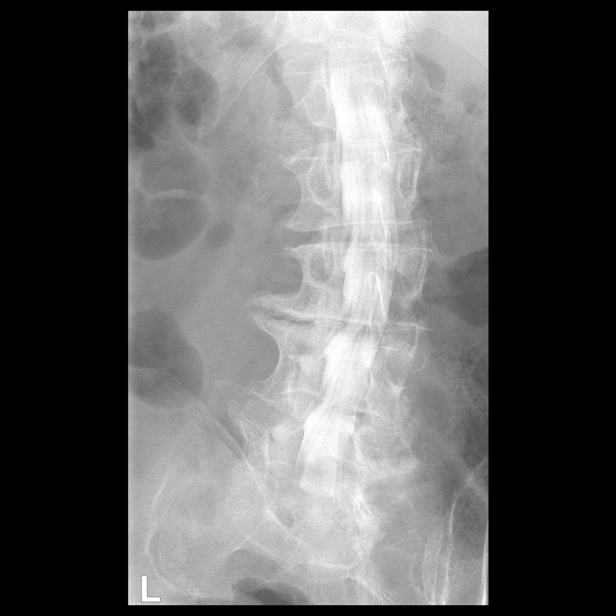

[vasc adipose (4 of 9)]
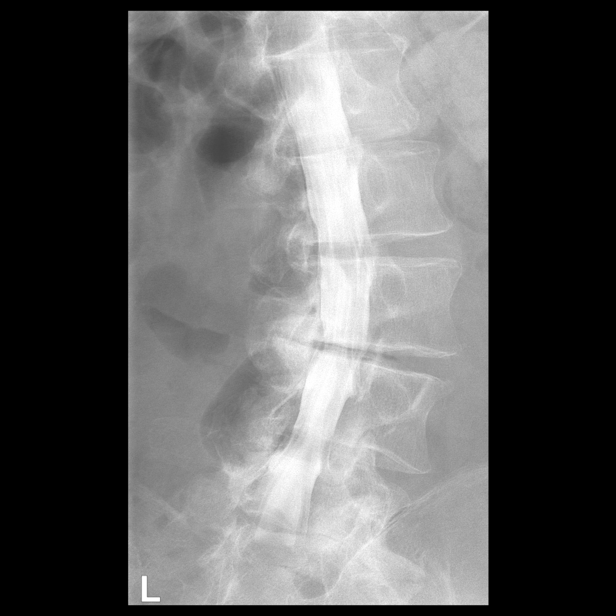

[vasc adipose (5 of 9)]
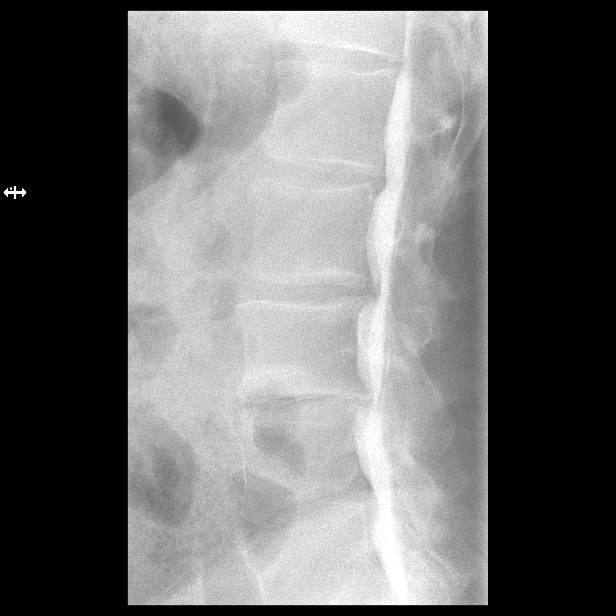

[vasc adipose (6 of 9)]
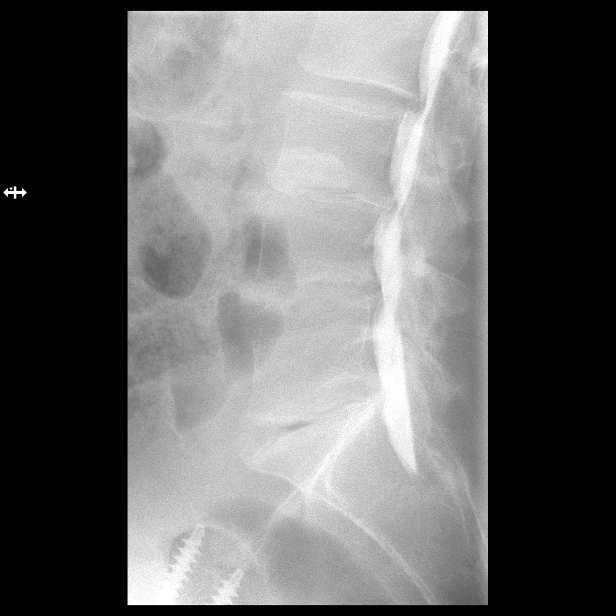

[vasc adipose (7 of 9)]
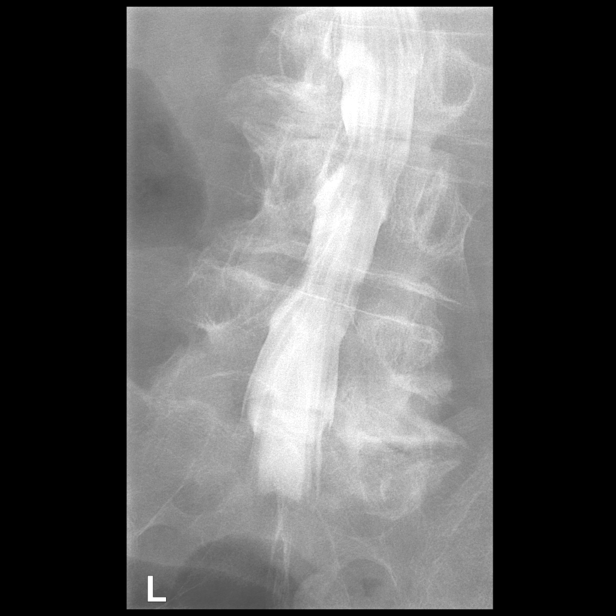

[vasc adipose (8 of 9)]
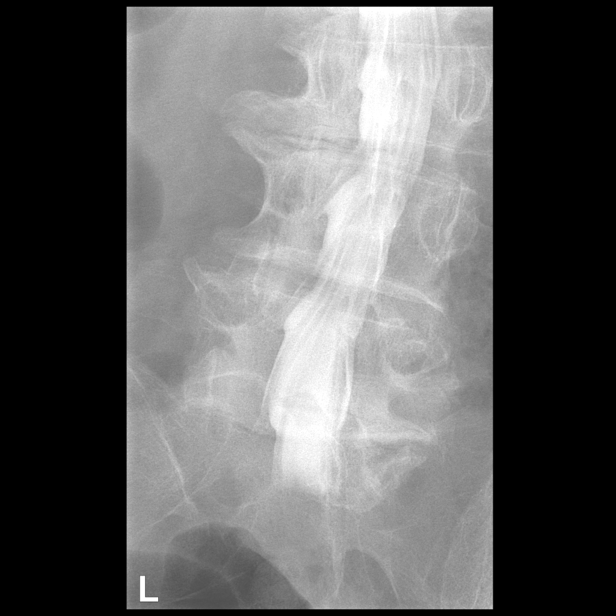

[vasc adipose (9 of 9)]
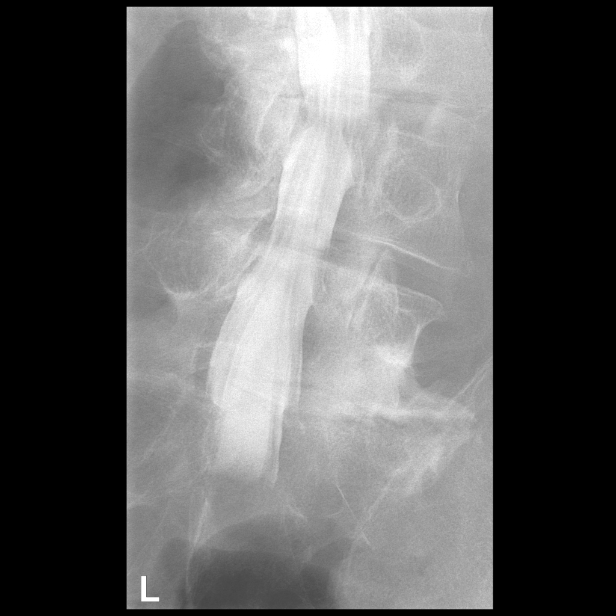

[13 of 14 positions shown; findings below may reference images not displayed]

EXAM:
LUMBAR MYELOGRAM

FLUOROSCOPY TIME:  1 minutes 0 seconds. 180.53 micro gray meter
squared

PROCEDURE:
After thorough discussion of risks and benefits of the procedure
including bleeding, infection, injury to nerves, blood vessels,
adjacent structures as well as headache and CSF leak, written and
oral informed consent was obtained. Consent was obtained by Dr. JEREZ
JEREZ. Time out form was completed.

Patient was positioned prone on the fluoroscopy table. Local
anesthesia was provided with 1% lidocaine without epinephrine after
prepped and draped in the usual sterile fashion. Puncture was
performed at L4-5 using a 3 1/2 inch 22-gauge spinal needle via
right paramedian approach. Using a single pass through the dura, the
needle was placed within the thecal sac, with return of clear CSF.
15 mL of Isovue [JH] was injected into the thecal sac, with normal
opacification of the nerve roots and cauda equina consistent with
free flow within the subarachnoid space.

I personally performed the lumbar puncture and administered the
intrathecal contrast. I also personally performed acquisition of the
myelogram images.
FINDINGS: LUMBAR MYELOGRAM FINDINGS:

Mild scoliotic curvature convex to the right. No antero or
retrolisthesis in the supine position or with standing. No abnormal
motion with flexion extension.

There is mild to moderate stenosis of the left lateral recess at
L3-4 and mild narrowing of the left lateral recess at L2-3. No other
apparent compressive pathology detected by myelography.

CT LUMBAR MYELOGRAM FINDINGS:

Curvature convex to the right with the apex at L3.

T11-12: Facet osteoarthritis right worse than left. No compressive
canal stenosis. Mild right foraminal narrowing.

T12-L1: Mild bulging of the disc towards the right. No compressive
stenosis.

L1-2: Normal interspace.

L2-3: Disc degeneration with loss of height on the left. Small
endplate osteophytes more prominent on the left. Mild bulging of the
disc towards the left. Mild narrowing of the left lateral recess.
Left foraminal narrowing but without gross compression of the
exiting L2 nerve.

L3-4: Disc degeneration more pronounced on the left. Disc space
narrowing with endplate osteophytes. Mild facet and ligamentous
hypertrophy. Stenosis of the left lateral recess and intervertebral
foramen on the left. Left-sided neural compression would be possible
at this level.

L4-5: Disc degeneration with mild vacuum phenomenon. Mild bulging of
the disc. Facet and ligamentous hypertrophy. Bilateral foraminal
stenosis that could compress either or both L4 nerves.

L5-S1: Disc degeneration with disc space narrowing, more pronounced
on the right. Endplate osteophytes and bulging of the disc. Facet
degeneration and hypertrophy on the right. No central canal
stenosis. Right foraminal narrowing that could compress the exiting
right L5 nerve.

Bilateral sacroiliac osteoarthritis is noted.
IMPRESSION: Scoliotic curvature convex to the right with the apex at L3.

L2-3: Left-sided predominant osteophytes and bulging disc. Mild
narrowing of the left lateral recess and intervertebral foramen on
the left but without definite neural compression.

L3-4: Disc degeneration with endplate osteophytes and bulging of the
disc more prominent towards the left. Stenosis of the left lateral
recess and intervertebral foramen on the left that could cause
left-sided neural compression.

L4-5: Disc degeneration and vacuum phenomenon. Endplate osteophytes
and mild bulging of the disc. No apparent compressive canal
stenosis. Bilateral foraminal stenosis that could affect either L4
nerve.

L5-S1: Disc degeneration and facet degeneration worse on the right.
Right foraminal stenosis that could affect the right L5 nerve.

Bilateral sacroiliac osteoarthritis.

## 2020-10-02 IMAGING — CT CT L SPINE W/ CM
1 series · 12 of 14 positions shown, 15 images · non-contrast
Comparison: MRI [DATE]

CLINICAL DATA: Unexplained bilateral leg weakness, left worse than
right.
TECHNIQUE: Contiguous axial images were obtained through the Lumbar spine after
the intrathecal infusion of infusion. Coronal and sagittal
reconstructions were obtained of the axial image sets.

[Series 3: l spine soft · axial · 0.30mm/px · z∈[-330,-104]mm · 12 of 135 slices shown, 15 images]
[im 11/135  soft-tissue]
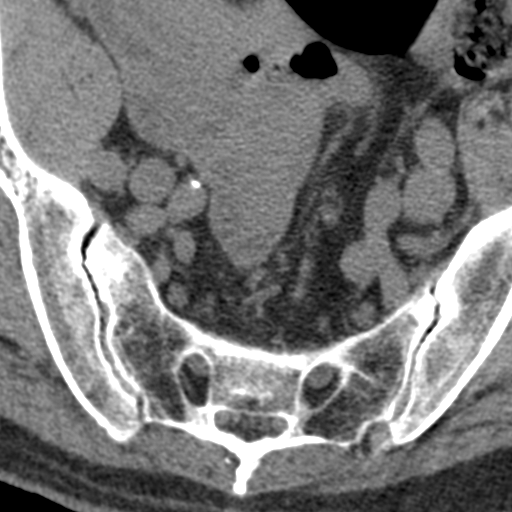
[im 11/135  bone]
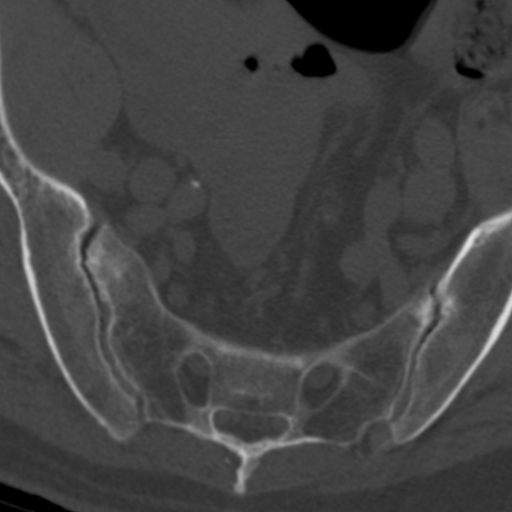
[im 21/135  bone]
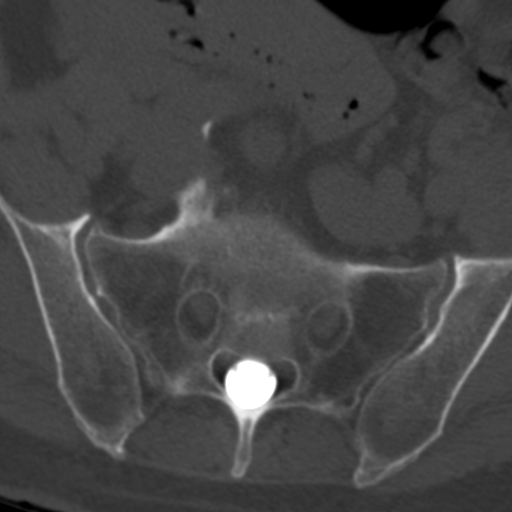
[im 31/135  bone]
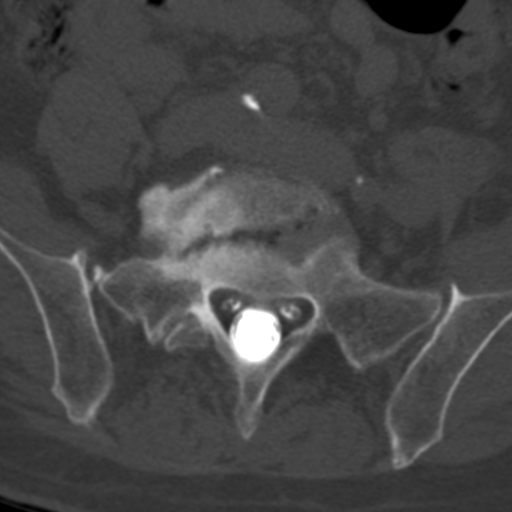
[im 42/135  bone]
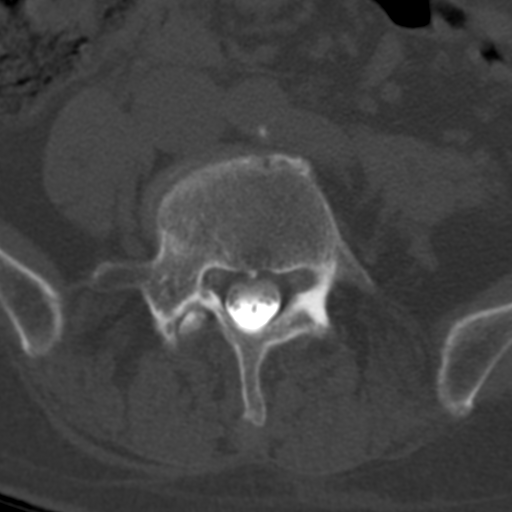
[im 52/135  soft-tissue]
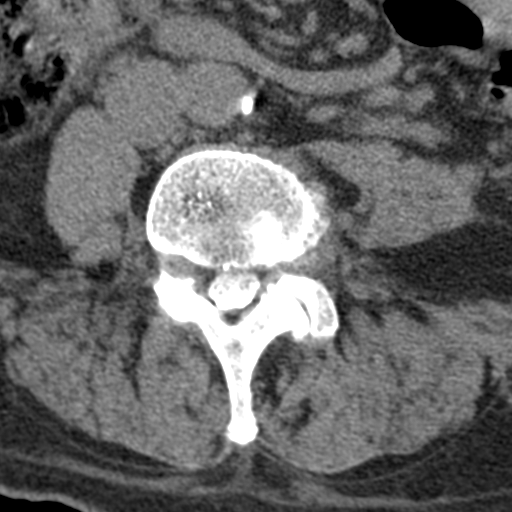
[im 52/135  bone]
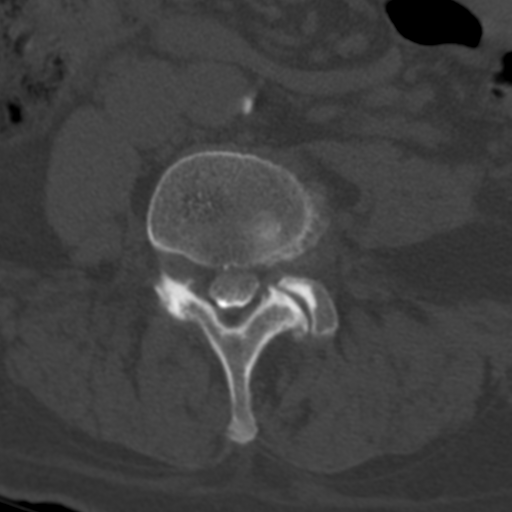
[im 62/135  bone]
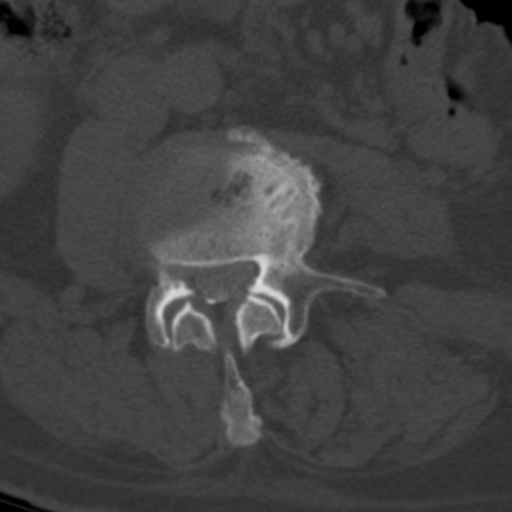
[im 73/135  bone]
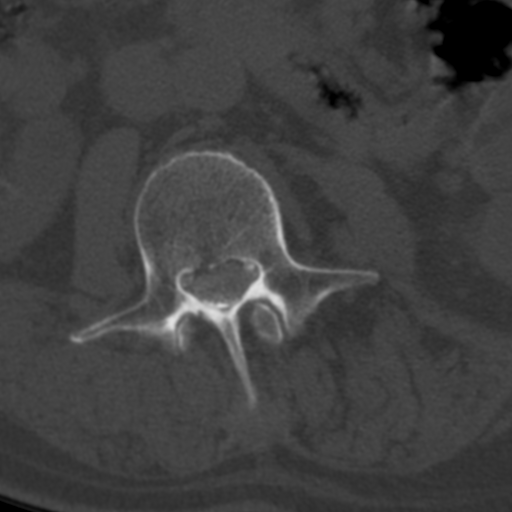
[im 83/135  bone]
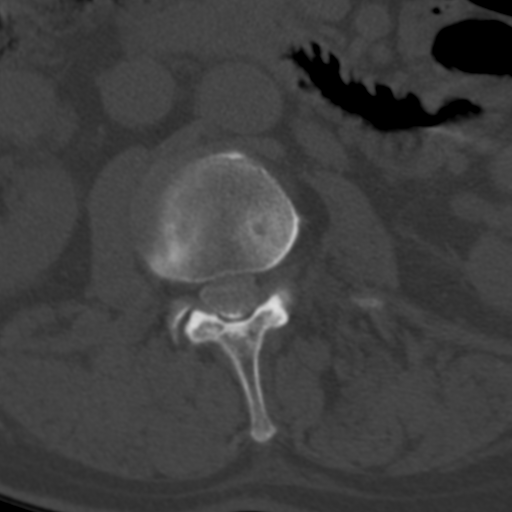
[im 93/135  soft-tissue]
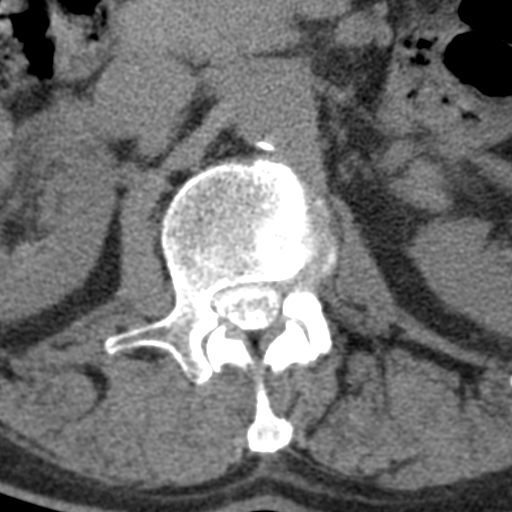
[im 93/135  bone]
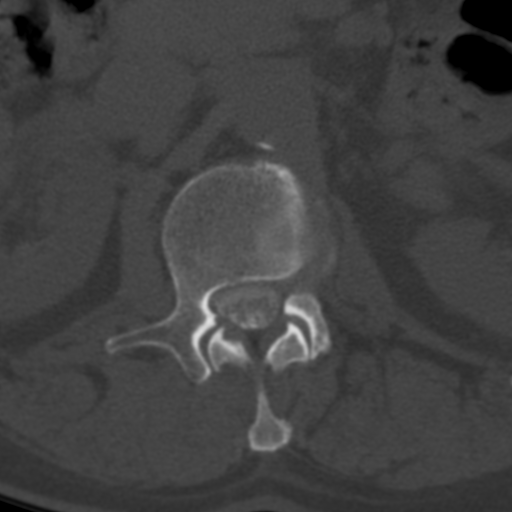
[im 104/135  bone]
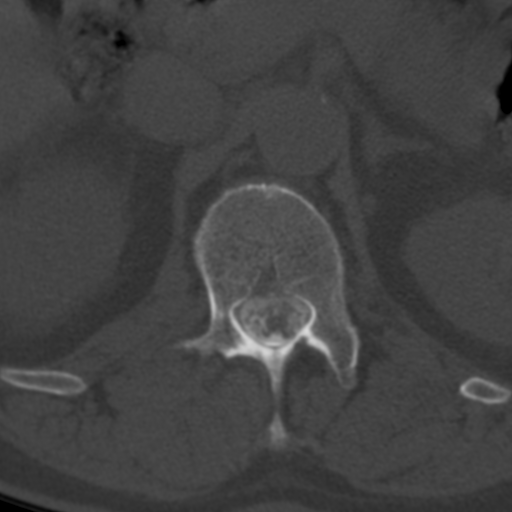
[im 114/135  bone]
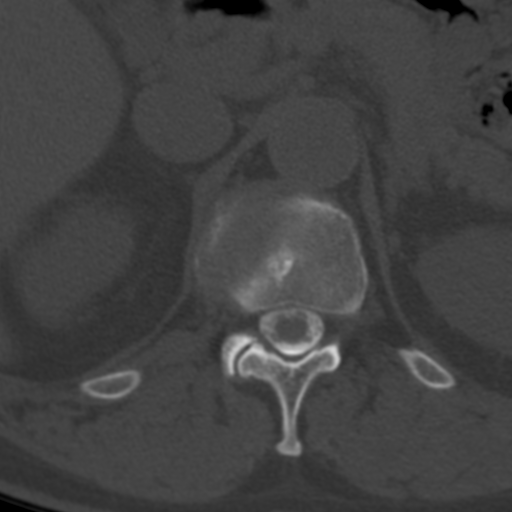
[im 124/135  bone]
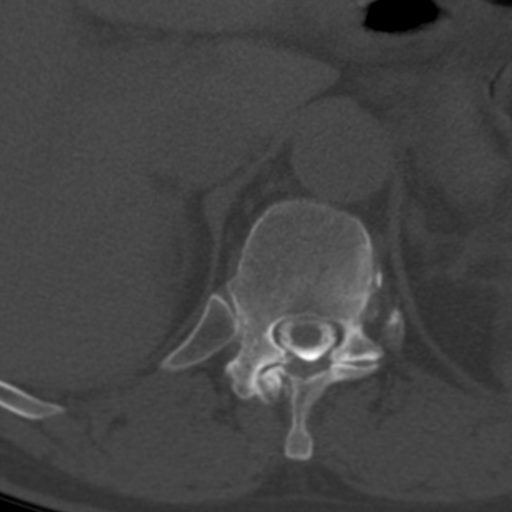

[12 of 14 positions shown; findings below may reference images not displayed]

EXAM:
LUMBAR MYELOGRAM

FLUOROSCOPY TIME:  1 minutes 0 seconds. 180.53 micro gray meter
squared

PROCEDURE:
After thorough discussion of risks and benefits of the procedure
including bleeding, infection, injury to nerves, blood vessels,
adjacent structures as well as headache and CSF leak, written and
oral informed consent was obtained. Consent was obtained by Dr. JEREZ
JEREZ. Time out form was completed.

Patient was positioned prone on the fluoroscopy table. Local
anesthesia was provided with 1% lidocaine without epinephrine after
prepped and draped in the usual sterile fashion. Puncture was
performed at L4-5 using a 3 1/2 inch 22-gauge spinal needle via
right paramedian approach. Using a single pass through the dura, the
needle was placed within the thecal sac, with return of clear CSF.
15 mL of Isovue [JH] was injected into the thecal sac, with normal
opacification of the nerve roots and cauda equina consistent with
free flow within the subarachnoid space.

I personally performed the lumbar puncture and administered the
intrathecal contrast. I also personally performed acquisition of the
myelogram images.
FINDINGS: LUMBAR MYELOGRAM FINDINGS:

Mild scoliotic curvature convex to the right. No antero or
retrolisthesis in the supine position or with standing. No abnormal
motion with flexion extension.

There is mild to moderate stenosis of the left lateral recess at
L3-4 and mild narrowing of the left lateral recess at L2-3. No other
apparent compressive pathology detected by myelography.

CT LUMBAR MYELOGRAM FINDINGS:

Curvature convex to the right with the apex at L3.

T11-12: Facet osteoarthritis right worse than left. No compressive
canal stenosis. Mild right foraminal narrowing.

T12-L1: Mild bulging of the disc towards the right. No compressive
stenosis.

L1-2: Normal interspace.

L2-3: Disc degeneration with loss of height on the left. Small
endplate osteophytes more prominent on the left. Mild bulging of the
disc towards the left. Mild narrowing of the left lateral recess.
Left foraminal narrowing but without gross compression of the
exiting L2 nerve.

L3-4: Disc degeneration more pronounced on the left. Disc space
narrowing with endplate osteophytes. Mild facet and ligamentous
hypertrophy. Stenosis of the left lateral recess and intervertebral
foramen on the left. Left-sided neural compression would be possible
at this level.

L4-5: Disc degeneration with mild vacuum phenomenon. Mild bulging of
the disc. Facet and ligamentous hypertrophy. Bilateral foraminal
stenosis that could compress either or both L4 nerves.

L5-S1: Disc degeneration with disc space narrowing, more pronounced
on the right. Endplate osteophytes and bulging of the disc. Facet
degeneration and hypertrophy on the right. No central canal
stenosis. Right foraminal narrowing that could compress the exiting
right L5 nerve.

Bilateral sacroiliac osteoarthritis is noted.
IMPRESSION: Scoliotic curvature convex to the right with the apex at L3.

L2-3: Left-sided predominant osteophytes and bulging disc. Mild
narrowing of the left lateral recess and intervertebral foramen on
the left but without definite neural compression.

L3-4: Disc degeneration with endplate osteophytes and bulging of the
disc more prominent towards the left. Stenosis of the left lateral
recess and intervertebral foramen on the left that could cause
left-sided neural compression.

L4-5: Disc degeneration and vacuum phenomenon. Endplate osteophytes
and mild bulging of the disc. No apparent compressive canal
stenosis. Bilateral foraminal stenosis that could affect either L4
nerve.

L5-S1: Disc degeneration and facet degeneration worse on the right.
Right foraminal stenosis that could affect the right L5 nerve.

Bilateral sacroiliac osteoarthritis.

## 2020-10-02 MED ORDER — DIAZEPAM 5 MG PO TABS
5.0000 mg | ORAL_TABLET | Freq: Once | ORAL | Status: AC
  Administered 2020-10-02: 5 mg via ORAL

## 2020-10-02 MED ORDER — IOPAMIDOL (ISOVUE-M 200) INJECTION 41%
20.0000 mL | Freq: Once | INTRAMUSCULAR | Status: AC
  Administered 2020-10-02: 20 mL via INTRATHECAL

## 2020-10-02 NOTE — Discharge Instructions (Signed)
Myelogram Discharge Instructions  1. Go home and rest quietly as needed. You may resume normal activities; however, do not exert yourself strongly or do any heavy lifting today and tomorrow.   2. DO NOT drive today.    3. You may resume your normal diet and medications unless otherwise indicated. Drink lots of extra fluids today and tomorrow.   4. The incidence of headache, nausea, or vomiting is about 5% (one in 20 patients).  If you develop a headache, lie flat for 24 hours and drink plenty of fluids until the headache goes away.  Caffeinated beverages may be helpful. If when you get up you still have a headache when standing, go back to bed and force fluids for another 24 hours.   5. If you develop severe nausea and vomiting or a headache that does not go away with the flat bedrest after 48 hours, please call [phone].   6. Call your physician for a follow-up appointment.  The results of your myelogram will be sent directly to your physician by the following day.  7. If you have any questions or if complications develop after you arrive home, please call [phone].  Discharge instructions have been explained to the patient.  The patient, or the person responsible for the patient, fully understands these instructions.   Thank you for visiting our office today.

## 2020-10-30 DIAGNOSIS — G4733 Obstructive sleep apnea (adult) (pediatric): Secondary | ICD-10-CM | POA: Diagnosis not present

## 2020-11-18 ENCOUNTER — Encounter: Payer: PRIVATE HEALTH INSURANCE | Admitting: Neurology

## 2020-11-19 ENCOUNTER — Encounter: Payer: Self-pay | Admitting: Adult Health

## 2020-11-19 ENCOUNTER — Ambulatory Visit (INDEPENDENT_AMBULATORY_CARE_PROVIDER_SITE_OTHER): Payer: PPO | Admitting: Adult Health

## 2020-11-19 ENCOUNTER — Other Ambulatory Visit: Payer: Self-pay

## 2020-11-19 VITALS — BP 146/89 | HR 65 | Ht 69.0 in | Wt 181.0 lb

## 2020-11-19 DIAGNOSIS — G4733 Obstructive sleep apnea (adult) (pediatric): Secondary | ICD-10-CM

## 2020-11-19 DIAGNOSIS — Z9989 Dependence on other enabling machines and devices: Secondary | ICD-10-CM | POA: Diagnosis not present

## 2020-11-19 NOTE — Progress Notes (Signed)
PATIENT: Timothy [name] DOB: 04/11/1951  REASON FOR VISIT: follow up HISTORY FROM: patient Primary Neurologist: Dr. [name]  HISTORY OF PRESENT ILLNESS: Today 11/19/20:  Timothy [name] is a 70 year old male with a history of OSA on CPAP. He returns today for follow-up. Reports CPAP is working well. Denies any new issues. Download is below    11/14/19: Timothy [name] is a 70 year old male with a history of obstructive sleep apnea on CPAP.  He returns today for follow-up.  His download indicates that he uses machine nightly for compliance of 100%.  He uses machine greater than 4 hours 29 days for compliance of 97%.  On average he uses his machine 5 hours and 42 minutes.  His residual AHI is 3.2 on 5 to 15 cm of water with EPR of 2.  Leak in the 95th percentile is 21.5 L/min he returns today for an evaluation.  HISTORY 11/08/18:   Timothy [name] is a 70 year old male with a history of obstructive sleep apnea on CPAP.  He returns today for follow-up.  His CPAP download indicates that he uses machine 29 out of 30 days for compliance of 97%.  He uses machine greater than 4 hours 26 out of 30 days for compliance of 87%.  On average he uses his machine 5 hours and 36 minutes.  His residual AHI is 4 on 5 to 15 cm of water with EPR of 2.  His leak in the 95th percentile is 21.9 L/min.  He reports that the CPAP continues to work well for him.  He has order new supplies which may explain his leak.  He joins me today for follow-up.  REVIEW OF SYSTEMS: Out of a complete 14 system review of symptoms, the patient complains only of the following symptoms, and all other reviewed systems are negative.   ESS 7  ALLERGIES: Allergies  Allergen Reactions  . Ciprofloxacin Diarrhea    HOME MEDICATIONS: Outpatient Medications Prior to Visit  Medication Sig Dispense Refill  . Ascorbic Acid (VITAMIN C PO) Take 1 tablet by mouth daily.    Marland Kitchen aspirin EC 81 MG tablet Take 81 mg by mouth daily. Swallow whole.    .  bisoprolol-hydrochlorothiazide (ZIAC) 5-6.25 MG tablet TAKE 1 TABLET DAILY FOR BLOOD PRESSURE 90 tablet 3  . Cholecalciferol (VITAMIN D-3) 125 MCG (5000 UT) TABS Take 5,000 Units by mouth every evening.     . divalproex (DEPAKOTE) 250 MG DR tablet Take 2 tablets (500 mg total) by mouth 2 (two) times daily. 360 tablet 3   No facility-administered medications prior to visit.    PAST MEDICAL HISTORY: Past Medical History:  Diagnosis Date  . Arthritis   . Bipolar disorder ([place])    controlled by meds since 1997  . Pneumonia   . Sleep apnea    cpap  . Wears glasses    reading    PAST SURGICAL HISTORY: Past Surgical History:  Procedure Laterality Date  . APPENDECTOMY    . COLONOSCOPY    . KNEE ARTHROSCOPY W/ MENISCAL REPAIR  2004   left  . ORIF HUMERUS FRACTURE Left 09/19/2013   Procedure: LEFT OPEN REDUCTION INTERNAL FIXATION (ORIF) PROXIMAL HUMERUS; ROTATOR CUFF REPAIR;  Surgeon: [name], MD;  Location: [facility];  Service: Orthopedics;  Laterality: Left;  . TONSILLECTOMY    . TOTAL HIP ARTHROPLASTY Left 07/18/2018   Procedure: TOTAL HIP ARTHROPLASTY ANTERIOR APPROACH;  Surgeon: Rod Can, MD;  Location: [facility];  Service: Orthopedics;  Laterality: Left;    FAMILY HISTORY: Family History  Adopted: Yes    SOCIAL HISTORY: Social History   Socioeconomic History  . Marital status: Married    Spouse name: Not on file  . Number of children: Not on file  . Years of education: Not on file  . Highest education level: Not on file  Occupational History  . Not on file  Tobacco Use  . Smoking status: Never  . Smokeless tobacco: Never  Vaping Use  . Vaping Use: Never used  Substance and Sexual Activity  . Alcohol use: Yes    Comment: 5 days a week  . Drug use: No  . Sexual activity: Yes  Other Topics Concern  . Not on file  Social History Narrative   Right handed   Social Determinants of Health   Financial Resource Strain: Not on file  Food  Insecurity: Not on file  Transportation Needs: Not on file  Physical Activity: Not on file  Stress: Not on file  Social Connections: Not on file  Intimate Partner Violence: Not on file      PHYSICAL EXAM  Vitals:   11/19/20 0818  BP: (!) 146/89  Pulse: 65  Weight: 181 lb (82.1 kg)  Height: 5\' 9"  (1.753 m)   Body mass index is 26.73 kg/m.  Generalized: Well developed, in no acute distress  Chest: Lungs clear to auscultation bilaterally  Neurological examination  Mentation: Alert oriented to time, place, history taking. Follows all commands speech and language fluent Cranial nerve II-XII: Extraocular movements were full, visual field were full on confrontational test Head turning and shoulder shrug  were normal and symmetric. Motor: The motor testing reveals 5 over 5 strength of all 4 extremities. Good symmetric motor tone is noted throughout.  Sensory: Sensory testing is intact to soft touch on all 4 extremities. No evidence of extinction is noted.  Gait and station: Gait is normal.    DIAGNOSTIC DATA (LABS, IMAGING, TESTING) - I reviewed patient records, labs, notes, testing and imaging myself where available.  Lab Results  Component Value Date   WBC 9.8 07/20/2020   HGB 13.3 07/20/2020   HCT 39.6 07/20/2020   MCV 93.4 07/20/2020   PLT 168 07/20/2020      Component Value Date/Time   NA 137 07/20/2020 1539   K 4.4 07/20/2020 1539   CL 100 07/20/2020 1539   CO2 30 07/20/2020 1539   GLUCOSE 79 07/20/2020 1539   BUN 28 (H) 07/20/2020 1539   CREATININE 0.96 07/20/2020 1539   CALCIUM 8.9 07/20/2020 1539   PROT 6.6 07/20/2020 1539   ALBUMIN 4.1 09/01/2016 1519   AST 15 07/20/2020 1539   ALT 13 07/20/2020 1539   ALKPHOS 45 09/01/2016 1519   BILITOT 0.5 07/20/2020 1539   GFRNONAA 80 07/20/2020 1539   GFRAA 93 07/20/2020 1539   Lab Results  Component Value Date   CHOL 165 07/20/2020   HDL 60 07/20/2020   LDLCALC 88 07/20/2020   TRIG 78 07/20/2020   CHOLHDL  2.8 07/20/2020   Lab Results  Component Value Date   HGBA1C 5.0 07/20/2020   No results found for: [id number] Lab Results  Component Value Date   TSH 0.54 07/20/2020      ASSESSMENT AND PLAN 70 y.o. year old male  has a past medical history of Arthritis, Bipolar disorder ([place]), Pneumonia, Sleep apnea, and Wears glasses. here with:  OSA on CPAP  - CPAP compliance excellent - Good treatment of AHI  -  Encourage patient to use CPAP nightly and > 4 hours each night - F/U in 1 year or sooner if needed   Ward Givens, MSN, NP-C 11/19/2020, 8:36 AM [facility] [address], [place], [place] [address] [phone]   Patient ID: Timothy [name], male   DOB: 1950/10/23, 70 y.o.   MRN: [id number]

## 2020-11-19 NOTE — Patient Instructions (Signed)
Your Plan:  Continue using CPAP nightly If your symptoms worsen or you develop new symptoms please let us know.   Thank you for coming to see us at [facility]. I hope we have been able to provide you high quality care today.  You may receive a patient satisfaction survey over the next few weeks. We would appreciate your feedback and comments so that we may continue to improve ourselves and the health of our patients.  

## 2020-11-20 ENCOUNTER — Other Ambulatory Visit: Payer: Self-pay

## 2020-11-20 ENCOUNTER — Ambulatory Visit (INDEPENDENT_AMBULATORY_CARE_PROVIDER_SITE_OTHER): Payer: PRIVATE HEALTH INSURANCE | Admitting: Psychiatry

## 2020-11-20 DIAGNOSIS — F319 Bipolar disorder, unspecified: Secondary | ICD-10-CM

## 2020-11-20 DIAGNOSIS — Z630 Problems in relationship with spouse or partner: Secondary | ICD-10-CM

## 2020-11-20 NOTE — Progress Notes (Signed)
Psychotherapy Progress Note [facility] Luan Moore, PhD LP  Patient ID: Timothy [name]     MRN: [id number] Therapy format: Individual psychotherapy Date: 11/20/2020      Start: 2:10p     Stop: 3:00p     Time Spent: 50 min Location: In-person   Session narrative (presenting needs, interim history, self-report of stressors and symptoms, applications of prior therapy, status changes, and interventions made in session) Living together about half time at this point, leery of overcommitting.  Brings a copy of the damning announcement [name] made in December to friends and family about him being bipolar, narcissistic.  Still stunning to see it in print and know how she has seemed to think it should be no offense.  Remains in a rental house himself for emotional safety, stability, and the capacity to tame conflict and shortcut any paranoid pursuit on her part.  Still can get accused by [name] of leering at other people, just the other day in fact.    Has not yet used the question "What do you need right now?" but has been trying to more spontaneously ask her what she would like or could use from him at the moment, in hopes of helping her see his sincere interest in returning to a loving relationship after the separation they've known.  Still clear that he cannot in good conscience or self-respect admit to cheating or impulses when he has none and they are all paranoid fantasies of hers, clear to him that she is projecting her womanizing father onto him.  Discussed at length ways to reshape patterns of conflict, especially when unjustly accused, emphasizing his continued willingness to transparency (e.g., OK to look through his phone), frankness without drama in the event something is irritating or offputting, and consider being willing to let [name] know that she has the capacity to run him off if she has to continue to compulsively accuse and suspect him.  Advised also be ready to ask her  whether it's actually possible in her mind for him to prove his innocence, or does it come down to an article of faith on her part that she is or is not willing to place in him.  Otherwise, be kindly inquiring what she needs out of strained moments, to at least usher her toward an assertive request instead of controlling by accusation, as she has gotten away with before.  Therapeutic modalities: Cognitive Behavioral Therapy, Solution-Oriented/Positive Psychology, and Assertiveness/Communication  Mental Status/Observations:  Appearance:   Casual and Neat     Behavior:  Appropriate  Motor:  Normal  Speech/Language:   Clear and Coherent  Affect:  Appropriate  Mood:  wearied  Thought process:  normal  Thought content:    WNL  Sensory/Perceptual disturbances:    WNL  Orientation:  Fully oriented  Attention:  Good    Concentration:  Good  Memory:  WNL  Insight:    Good  Judgment:   Good  Impulse Control:  Good   Risk Assessment: Danger to Self: No Self-injurious Behavior: No Danger to Others: No Physical Aggression / Violence: No Duty to Warn: No Access to Firearms a concern: No  Assessment of progress:  progressing  Diagnosis:   ICD-10-CM   1. Bipolar I disorder ([place]) -- very stable  F31.9    No personality disorder sxs apparent     2. Relationship problem between partners  Z63.0      Plan:  Use assertive communication tactics as noted in conflict with,  or under duress from, [employer] to include [name] at discretion Continue to find no evidence of personality disorder, and no signs of active mood disorder Other recommendations/advice as may be noted above Continue to utilize previously learned skills ad lib Maintain medication as prescribed and work faithfully with relevant prescriber(s) if any changes are desired or seem indicated Call the clinic on-call service, present to ER, or call 911 if any life-threatening psychiatric crisis Return for time at  discretion. Already scheduled visit in this office Visit date not found.  Blanchie Serve, PhD Luan Moore, PhD LP Clinical Psychologist, [facility] [address], [place], [place] [address] [phone]

## 2020-12-01 ENCOUNTER — Other Ambulatory Visit: Payer: Self-pay

## 2020-12-01 ENCOUNTER — Ambulatory Visit: Payer: PPO | Admitting: Neurology

## 2020-12-01 DIAGNOSIS — M5417 Radiculopathy, lumbosacral region: Secondary | ICD-10-CM

## 2020-12-01 DIAGNOSIS — R29898 Other symptoms and signs involving the musculoskeletal system: Secondary | ICD-10-CM | POA: Diagnosis not present

## 2020-12-01 NOTE — Procedures (Signed)
[facility]  [place], [place]  [place], [place] [address] Tel: [phone] Fax:  [phone] Test Date:  12/01/2020  Patient: Timothy [name] DOB: 06/08/1950 Physician: Narda Amber, DO  Sex: Male Height: 5\' 9"  Ref Phys: Metta Clines, D.O.  ID#: [id number]   Technician:    Patient Complaints: This is a 70 year old man referred for evaluation of bilateral leg weakness, worse on the left.  NCV & EMG Findings: Extensive electrodiagnostic testing of the left lower extremity and additional studies of the right shows:  Bilateral sural and superficial peroneal sensory responses are within normal limits. Bilateral peroneal and tibial motor responses are within normal limits. Bilateral tibial H reflex studies are within normal limits. Chronic motor axon loss changes are isolated to the right L5 myotome, without accompanied active denervation.  These findings are not present in the left lower extremity.  Impression: Chronic L5 radiculopathy affecting the right lower extremity, mild.  There is no evidence of a sensorimotor polyneuropathy.     ___________________________ Narda Amber, DO    Nerve Conduction Studies Anti Sensory Summary Table   Stim Site NR Peak (ms) Norm Peak (ms) P-T Amp (V) Norm P-T Amp  Left Sup Peroneal Anti Sensory (Ant Lat Mall)  34C  12 cm    2.7 <4.6 5.1 >3  Right Sup Peroneal Anti Sensory (Ant Lat Mall)  34C  12 cm    2.0 <4.6 6.5 >3  Left Sural Anti Sensory (Lat Mall)  34C  Calf    2.9 <4.6 5.4 >3  Right Sural Anti Sensory (Lat Mall)  34C  Calf    2.6 <4.6 5.2 >3   Motor Summary Table   Stim Site NR Onset (ms) Norm Onset (ms) O-P Amp (mV) Norm O-P Amp Site1 Site2 Delta-0 (ms) Dist (cm) Vel (m/s) Norm Vel (m/s)  Left Peroneal Motor (Ext Dig Brev)  34C  Ankle    3.6 <6.0 4.6 >2.5 B Fib Ankle 7.7 37.0 48 >40  B Fib    11.3  4.4  Poplt B Fib 1.6 10.0 63 >40  Poplt    12.9  4.4         Right Peroneal Motor (Ext Dig Brev)  34C  Ankle     3.3 <6.0 4.7 >2.5 B Fib Ankle 7.8 39.0 50 >40  B Fib    11.1  4.7  Poplt B Fib 1.8 10.0 56 >40  Poplt    12.9  4.7         Left Tibial Motor (Abd Hall Brev)  34C  Ankle    3.4 <6.0 15.5 >4 Knee Ankle 8.8 42.0 48 >40  Knee    12.2  14.7         Right Tibial Motor (Abd Hall Brev)  34C  Ankle    3.2 <6.0 14.5 >4 Knee Ankle 8.9 45.0 51 >40  Knee    12.1  12.0          H Reflex Studies   NR H-Lat (ms) Lat Norm (ms) L-R H-Lat (ms)  Left Tibial (Gastroc)  34C     34.01 <35 2.31  Right Tibial (Gastroc)  34C     31.70 <35 2.31   EMG   Side Muscle Ins Act Fibs Psw Fasc Number Recrt Dur Dur. Amp Amp. Poly Poly. Comment  Right AntTibialis Nml Nml Nml Nml 1- Rapid Some 1+ Some 1+ Some 1+ N/A  Right Gastroc Nml Nml Nml Nml Nml Nml Nml Nml Nml Nml Nml  Nml N/A  Right Flex Dig Long Nml Nml Nml Nml 1- Rapid Some 1+ Some 1+ Some 1+ N/A  Right RectFemoris Nml Nml Nml Nml Nml Nml Nml Nml Nml Nml Nml Nml N/A  Right GluteusMed Nml Nml Nml Nml 1- Rapid Some 1+ Some 1+ Some 1+ N/A  Left AntTibialis Nml Nml Nml Nml Nml Nml Nml Nml Nml Nml Nml Nml N/A  Left Gastroc Nml Nml Nml Nml Nml Nml Nml Nml Nml Nml Nml Nml N/A  Left Flex Dig Long Nml Nml Nml Nml Nml Nml Nml Nml Nml Nml Nml Nml N/A  Left RectFemoris Nml Nml Nml Nml Nml Nml Nml Nml Nml Nml Nml Nml N/A  Left GluteusMed Nml Nml Nml Nml Nml Nml Nml Nml Nml Nml Nml Nml N/A      Waveforms:

## 2020-12-04 NOTE — Progress Notes (Signed)
MEDICARE ANNUAL WELLNESS VISIT AND FOLLOW UP Assessment:   Diagnoses and all orders for this visit:  Encounter for Medicare annual wellness exam Due annually  Health maintenance reviewed  Essential hypertension Continue medication Monitor blood pressure at home; call if consistently over 130/80 Continue DASH diet.   Reminder to go to the ER if any CP, SOB, nausea, dizziness, severe HA, changes vision/speech, left arm numbness and tingling and jaw pain.  Vitamin D deficiency Continue supplementation; goal is 60-100;  Defer vitamin D level  OSA on CPAP Reports 100% compliance; no concerns; follows with Dr. [name]  Other abnormal glucose Recent A1Cs at goal Discussed diet/exercise, weight management  Defer A1C;   Medication management CBC, CMP/GFR q40m  Bipolar type 1 depression ([place]) Continue follow up with Dr. Clovis Pu Check valproic acid levels PRN only; has been stable for many years  History of adenomatous polyp of colon Due follow up, has scheduled 02/2021 per patient   Degenerative scoliosis/5R L5 radiculopathy Has had extensive workup per below, patient noting new R>L weakness that does correspond with CT myelogram and EMG/NCV results though note not the same concern he was having when he saw neurosurgery He reports was advised repeat PT by neuro -  In light of new R>L sx per above may be worth trying steroid taper - discussed with patient and Dr. Thelma Comp in agreement, sent in  Patient to continue back exercises as recommended by PT If no improvement would recommend follow up in neurosurgery due to new sx changed since last OV consistent with structural issue  BMI 26, adult Continue to recommend diet heavy in fruits and veggies and low in animal meats, cheeses, and dairy products, appropriate calorie intake Discuss exercise recommendations routinely Continue to monitor weight at each visit  Defer labs today; last were normal without recent changes  Over 30  minutes of exam, counseling, chart review, and critical decision making was performed  Future Appointments  Date Time Provider [place]  02/18/2021  2:30 PM [name], MD [facility] None  03/29/2021  1:30 PM Pieter Partridge, DO [facility] None  07/27/2021  3:00 PM [name], MD [facility] None  08/23/2021  3:00 PM Cottle, Billey Co., MD [facility] None  11/16/2021  1:30 PM Ward Givens, NP [facility] None  12/07/2021  3:30 PM Liane Comber, NP [facility] None     Plan:   During the course of the visit the patient was educated and counseled about appropriate screening and preventive services including:   Pneumococcal vaccine  Influenza vaccine Prevnar 13 Td vaccine Screening electrocardiogram Colorectal cancer screening Diabetes screening Glaucoma screening Nutrition counseling    Subjective:  Timothy [name] is a 70 y.o. male who presents for Medicare Annual Wellness Visit and 6 month follow up for HTN, hyperlipidemia, glucose management, and vitamin D Def.  Today he is frustrated about persistent extremity weakness.   He had L hip replaced by Dr. Lyla Glassing in 2020, reports was recovering steadily but reports winter he noted progressive weakness of hip and thigh on left, was evaluated with normal hip reported. He was evaluated by Dr. Mayer Camel who felt likely lumbar etiology. He was referred to neurosurgery, ? Dr. Newman Pies who advised degenerative disc disease but without indication for surgery at that time and was recommended PT. Patient reported no improvement with this. He continued to perceive weakness of bilateral legs, note L>R at that time. He underwent L5-S1 interlaminar injection in 04/2020 without relief. He was evaluated by neurology Dr. 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 The patient notes in the last 4-6 weeks has noted less L sided weakness, has had R "foot drop" - difficulty lifting foot, shuffling and stumbling, notes lumbar pain is rare and intermittent. He reports called back this AM for recommendations and was advised further PT, very frustrated as has completed 2 courses without benefit thus far. Has continued with home exercises for back.   Patient  followed by Dr Clovis Pu for Bipolar Depression & is on Depakote. Levels were checked in Feb 2022. Patient feels he is doing well.   He has OSA on CPAP since 11/2017; no issues, endorses 100% compliance and endorses restorative sleep. Followed by Dr. Beacher May annually.   BMI is Body mass index is 27.32 kg/m., he has been working on diet and exercise, does free weights and back exercises at home, no longer able to take walks or hike due to extremity weakness working up by neuro.  Wt Readings from Last 3 Encounters:  12/07/20 185 lb (83.9 kg)  11/19/20 181 lb (82.1 kg)  09/24/20 183 lb 9.6 oz (83.3 kg)   He does not check BP at home, today their BP is BP: 132/70 He does workout. He denies chest pain, shortness of breath, dizziness.   He is not on cholesterol medication and denies myalgias. His cholesterol is at goal and has been for several years. The cholesterol last visit was:   Lab Results  Component Value Date   CHOL 165 07/20/2020   HDL 60 07/20/2020   LDLCALC 88 07/20/2020   TRIG 78 07/20/2020   CHOLHDL 2.8 07/20/2020   He has been working on diet and exercise for glucose management, and denies foot ulcerations, increased appetite, nausea, paresthesia of the feet, polydipsia, polyuria, visual disturbances, vomiting and weight loss. Last A1C in the office was:  Lab Results  Component Value Date   HGBA1C 5.0 07/20/2020   Last GFR Lab Results  Component Value Date   GFRNONAA 80 07/20/2020    Patient is on Vitamin D supplement, no recent dose change  Lab Results  Component Value Date    VD25OH 77 07/20/2020      Medication Review:  Current Outpatient Medications (Endocrine & Metabolic):    predniSONE (DELTASONE) 20 MG tablet, 3 tablets daily with food for 3 days, 2 tabs daily for 3 days, 1 tab a day for 5 days.  Current Outpatient Medications (Cardiovascular):    bisoprolol-hydrochlorothiazide (ZIAC) 5-6.25 MG tablet, TAKE 1 TABLET DAILY FOR BLOOD PRESSURE   Current Outpatient Medications (Analgesics):    aspirin EC 81 MG tablet, Take 81 mg by mouth daily. Swallow whole.   Current Outpatient Medications (Other):    Ascorbic Acid (VITAMIN C PO), Take 1 tablet by mouth daily.   Cholecalciferol (VITAMIN D-3) 125 MCG (5000 UT) TABS, Take 5,000 Units by mouth every evening.    divalproex (DEPAKOTE) 250 MG DR tablet, Take 2 tablets (500 mg total) by mouth 2 (two) times daily.  Allergies: Allergies  Allergen Reactions   Ciprofloxacin Diarrhea    Current Problems (verified) has HTN (hypertension); Vitamin D deficiency; Other abnormal glucose; Diarrhea; Medication management; Depression; History of adenomatous polyp of colon; S/P total hip arthroplasty; OSA on CPAP; Bipolar I disorder ([place]); and Degenerative scoliosis in adult patient on their problem list.  Screening Tests Immunization History  Administered  Date(s) Administered   Moderna Sars-Covid-2 Vaccination 07/02/2019, 07/30/2019   PFIZER(Purple Top)SARS-COV-2 Vaccination 03/13/2020   Pneumococcal Conjugate-13 04/05/2017   Pneumococcal Polysaccharide-23 11/22/2018   Td 11/06/2017    Preventative care: Last colonoscopy: May/2017 - benign polyp - Dr. Amedeo Plenty -5 year follow up, patient has called to schedule, soonest appointment in Oct 2022 is scheduled  Prior vaccinations: TD or Tdap: 2019 Influenza: 04/2018- declines   Pneumococcal: 10/2018 Prevnar13: 2018 Shingles/Zostavax: had at previous practice Covid 19: 2/2, 2021, moderna + 02/2020  Names of Other Physician/Practitioners you currently use: 1.  [facility] here for primary care 2. Dr. [place], eye doctor, last visit 2020, no issues, readers  3. [place], dentist, last visit 2021, q68m 4. Dermatology specialists, annually, last in 09/2019, has scheduled 11/2020  Patient Care Team: [name], MD as PCP - General (Internal Medicine) Cottle, Billey Co., MD as Attending Physician (Psychiatry)  Surgical: He  has a past surgical history that includes Tonsillectomy; Appendectomy; Colonoscopy; Knee arthroscopy w/ meniscal repair (2004); ORIF humerus fracture (Left, 09/19/2013); and Total hip arthroplasty (Left, 07/18/2018). Family His family history is not on file. He was adopted. Social history  He reports that he has never smoked. He has never used smokeless tobacco. He reports current alcohol use. He reports that he does not use drugs.  MEDICARE WELLNESS OBJECTIVES: Physical activity: Current Exercise Habits: Home exercise routine, Type of exercise: stretching;strength training/weights, Time (Minutes): 20, Frequency (Times/Week): 3, Weekly Exercise (Minutes/Week): 60, Intensity: Mild, Exercise limited by: neurologic condition(s) Cardiac risk factors: Cardiac Risk Factors include: advanced age (>70men, >25 women);dyslipidemia;hypertension;male gender;sedentary lifestyle Depression/mood screen:   Depression screen [facility] 2/9 12/07/2020  Decreased Interest 0  Down, Depressed, Hopeless 0  PHQ - 2 Score 0  Altered sleeping -  Tired, decreased energy -  Change in appetite -  Feeling bad or failure about yourself  -  Trouble concentrating -  Moving slowly or fidgety/restless -  Suicidal thoughts -  PHQ-9 Score -    ADLs:  In your present state of health, do you have any difficulty performing the following activities: 12/07/2020 07/19/2020  Hearing? N N  Vision? N N  Difficulty concentrating or making decisions? N N  Walking or climbing stairs? Y N  Comment neuro working up, manages in home -   Dressing or bathing? N N  Doing errands, shopping? N N  Some recent data might be hidden     Cognitive Testing  Alert? Yes  Normal Appearance?Yes  Oriented to person? Yes  Place? Yes   Time? Yes  Recall of three objects?  Yes  Can perform simple calculations? Yes  Displays appropriate judgment?Yes  Can read the correct time from a watch face?Yes  EOL planning: Does Patient Have a Medical Advance Directive?: Yes Type of Advance Directive: Healthcare Power of Attorney, Living will Does patient want to make changes to medical advance directive?: No - Patient declined Copy of [place] in Chart?: No - copy requested   Objective:   Today's Vitals   12/07/20 1504 12/07/20 1547  BP: 140/72 132/70  Pulse: 62   Temp: (!) 97.3 F (36.3 C)   SpO2: 95%   Weight: 185 lb (83.9 kg)     Body mass index is 27.32 kg/m.  General appearance: alert, no distress, WD/WN, male HEENT: normocephalic, sclerae anicteric, TMs pearly, nares patent, no discharge or erythema, pharynx normal Oral cavity: MMM, no lesions Neck: supple, no lymphadenopathy, no thyromegaly, no masses Heart:  RRR, normal S1, S2, no murmurs Lungs: CTA bilaterally, no wheezes, rhonchi, or rales Abdomen: +bs, soft, non tender, non distended, no masses, no hepatomegaly, no splenomegaly Musculoskeletal: nontender, no swelling, no obvious deformity Extremities: no edema, no cyanosis, no clubbing Pulses: 2+ symmetric, upper and lower extremities, normal cap refill Neurological: alert, oriented x 3, CN2-12 intact, strength normal upper extremities, lower extremities R 4/5 with knee extension and flexion, Left 5/5, ankle flexion and extension is 5/5 and symmetrical. Sensation normal throughout, DTRs 2+ throughout, no cerebellar signs, gait mildly wide based and shuffling. Psychiatric: normal affect, behavior normal, pleasant   Medicare Attestation I have personally reviewed: The patient's medical and social  history Their use of alcohol, tobacco or illicit drugs Their current medications and supplements The patient's functional ability including ADLs,fall risks, home safety risks, cognitive, and hearing and visual impairment Diet and physical activities Evidence for depression or mood disorders  The patient's weight, height, BMI, and visual acuity have been recorded in the chart.  I have made referrals, counseling, and provided education to the patient based on review of the above and I have provided the patient with a written personalized care plan for preventive services.     Izora Ribas, NP   12/07/2020

## 2020-12-07 ENCOUNTER — Other Ambulatory Visit: Payer: Self-pay

## 2020-12-07 ENCOUNTER — Ambulatory Visit (INDEPENDENT_AMBULATORY_CARE_PROVIDER_SITE_OTHER): Payer: PPO | Admitting: Adult Health

## 2020-12-07 ENCOUNTER — Telehealth: Payer: Self-pay | Admitting: Neurology

## 2020-12-07 ENCOUNTER — Encounter: Payer: Self-pay | Admitting: Adult Health

## 2020-12-07 VITALS — BP 132/70 | HR 62 | Temp 97.3°F | Wt 185.0 lb

## 2020-12-07 DIAGNOSIS — R197 Diarrhea, unspecified: Secondary | ICD-10-CM

## 2020-12-07 DIAGNOSIS — Z79899 Other long term (current) drug therapy: Secondary | ICD-10-CM | POA: Diagnosis not present

## 2020-12-07 DIAGNOSIS — R6889 Other general symptoms and signs: Secondary | ICD-10-CM

## 2020-12-07 DIAGNOSIS — R7309 Other abnormal glucose: Secondary | ICD-10-CM

## 2020-12-07 DIAGNOSIS — F32A Depression, unspecified: Secondary | ICD-10-CM

## 2020-12-07 DIAGNOSIS — F319 Bipolar disorder, unspecified: Secondary | ICD-10-CM | POA: Diagnosis not present

## 2020-12-07 DIAGNOSIS — Z86010 Personal history of colonic polyps: Secondary | ICD-10-CM

## 2020-12-07 DIAGNOSIS — Z860101 Personal history of adenomatous and serrated colon polyps: Secondary | ICD-10-CM

## 2020-12-07 DIAGNOSIS — M5416 Radiculopathy, lumbar region: Secondary | ICD-10-CM

## 2020-12-07 DIAGNOSIS — E559 Vitamin D deficiency, unspecified: Secondary | ICD-10-CM | POA: Diagnosis not present

## 2020-12-07 DIAGNOSIS — M4180 Other forms of scoliosis, site unspecified: Secondary | ICD-10-CM | POA: Diagnosis not present

## 2020-12-07 DIAGNOSIS — Z6826 Body mass index (BMI) 26.0-26.9, adult: Secondary | ICD-10-CM

## 2020-12-07 DIAGNOSIS — Z0000 Encounter for general adult medical examination without abnormal findings: Secondary | ICD-10-CM

## 2020-12-07 DIAGNOSIS — M4150 Other secondary scoliosis, site unspecified: Secondary | ICD-10-CM

## 2020-12-07 DIAGNOSIS — R29898 Other symptoms and signs involving the musculoskeletal system: Secondary | ICD-10-CM

## 2020-12-07 DIAGNOSIS — I10 Essential (primary) hypertension: Secondary | ICD-10-CM

## 2020-12-07 DIAGNOSIS — G4733 Obstructive sleep apnea (adult) (pediatric): Secondary | ICD-10-CM

## 2020-12-07 DIAGNOSIS — Z0001 Encounter for general adult medical examination with abnormal findings: Secondary | ICD-10-CM

## 2020-12-07 MED ORDER — PREDNISONE 20 MG PO TABS: ORAL_TABLET | ORAL | 0 refills | Status: AC

## 2020-12-07 NOTE — Telephone Encounter (Signed)
Pt called and lm. Would like a call back from jaffe, as dr.patel said he would be calling him. [phone]

## 2020-12-07 NOTE — Progress Notes (Signed)
Pt advised.

## 2020-12-07 NOTE — Telephone Encounter (Signed)
See results notes. 

## 2020-12-07 NOTE — Progress Notes (Signed)
Pt advised of his EMG. Pt states he is unable to lift  his right foot when he walks. What should he do?

## 2020-12-11 DIAGNOSIS — L578 Other skin changes due to chronic exposure to nonionizing radiation: Secondary | ICD-10-CM | POA: Diagnosis not present

## 2020-12-11 DIAGNOSIS — D485 Neoplasm of uncertain behavior of skin: Secondary | ICD-10-CM | POA: Diagnosis not present

## 2020-12-11 DIAGNOSIS — C4441 Basal cell carcinoma of skin of scalp and neck: Secondary | ICD-10-CM | POA: Diagnosis not present

## 2020-12-11 DIAGNOSIS — Z85820 Personal history of malignant melanoma of skin: Secondary | ICD-10-CM | POA: Diagnosis not present

## 2020-12-11 DIAGNOSIS — L570 Actinic keratosis: Secondary | ICD-10-CM | POA: Diagnosis not present

## 2020-12-11 DIAGNOSIS — L821 Other seborrheic keratosis: Secondary | ICD-10-CM | POA: Diagnosis not present

## 2020-12-11 DIAGNOSIS — D225 Melanocytic nevi of trunk: Secondary | ICD-10-CM | POA: Diagnosis not present

## 2020-12-11 DIAGNOSIS — Z85828 Personal history of other malignant neoplasm of skin: Secondary | ICD-10-CM | POA: Diagnosis not present

## 2020-12-11 DIAGNOSIS — D2272 Melanocytic nevi of left lower limb, including hip: Secondary | ICD-10-CM | POA: Diagnosis not present

## 2020-12-23 DIAGNOSIS — C4441 Basal cell carcinoma of skin of scalp and neck: Secondary | ICD-10-CM | POA: Diagnosis not present

## 2021-02-18 ENCOUNTER — Ambulatory Visit (INDEPENDENT_AMBULATORY_CARE_PROVIDER_SITE_OTHER): Payer: PPO | Admitting: Internal Medicine

## 2021-02-18 ENCOUNTER — Other Ambulatory Visit: Payer: Self-pay

## 2021-02-18 VITALS — BP 132/80 | HR 69 | Temp 97.5°F | Resp 16 | Ht 69.0 in | Wt 182.4 lb

## 2021-02-18 DIAGNOSIS — G4733 Obstructive sleep apnea (adult) (pediatric): Secondary | ICD-10-CM | POA: Diagnosis not present

## 2021-02-18 DIAGNOSIS — E782 Mixed hyperlipidemia: Secondary | ICD-10-CM

## 2021-02-18 DIAGNOSIS — R2681 Unsteadiness on feet: Secondary | ICD-10-CM

## 2021-02-18 DIAGNOSIS — Z9989 Dependence on other enabling machines and devices: Secondary | ICD-10-CM | POA: Diagnosis not present

## 2021-02-18 DIAGNOSIS — E559 Vitamin D deficiency, unspecified: Secondary | ICD-10-CM

## 2021-02-18 DIAGNOSIS — R7309 Other abnormal glucose: Secondary | ICD-10-CM | POA: Diagnosis not present

## 2021-02-18 DIAGNOSIS — I10 Essential (primary) hypertension: Secondary | ICD-10-CM

## 2021-02-18 DIAGNOSIS — Z79899 Other long term (current) drug therapy: Secondary | ICD-10-CM | POA: Diagnosis not present

## 2021-02-18 NOTE — Patient Instructions (Signed)

## 2021-02-18 NOTE — Progress Notes (Signed)
Future Appointments  Date Time Provider [place]  9/Timothy/2022  2:30 PM [name], MD [facility] None  03/29/2021  1:30 PM Pieter Partridge, DO [facility] None  07/27/2021     - CPE  3:00 PM [name], MD [facility] None  08/23/2021  3:00 PM Cottle, Billey Co., MD CP-CP None  11/16/2021  1:30 PM Ward Givens, NP [facility] None  12/07/2021  3:30 PM Liane Comber, NP [facility] None    History of Present Illness:       This very nice 70 [name] presents for 3 month follow up with HTN, HLD, Pre-Diabetes and Vitamin D Deficiency.        Patient is treated for HTN (2012) & BP has been controlled at home. Today's BP is at goal - 132/80. Patient has had no complaints of any cardiac type chest pain, palpitations, dyspnea / orthopnea / PND, dizziness, claudication, or dependent edema.       Hyperlipidemia is controlled with diet & meds. Patient denies myalgias or other med SE's. Last Lipids were at goal:  Lab Results  Component Value Date   CHOL 165 07/20/2020   HDL 60 07/20/2020   LDLCALC 88 07/20/2020   TRIG 78 07/20/2020   CHOLHDL 2.8 07/20/2020     Also, the patient is monitored for glucose intolerance  and has had no symptoms of reactive hypoglycemia, diabetic polys, paresthesias or visual blurring.  Last A1c was normal & at goal:  Lab Results  Component Value Date   HGBA1C 5.0 07/20/2020                                                           Further, the patient also has history of Vitamin D Deficiency ("28" /2017)  and supplements vitamin D without any suspected side-effects. Last vitamin D was at goal:    Lab Results  Component Value Date   VD25OH 77 07/20/2020     Current Outpatient Medications on File Prior to Visit  Medication Sig   Ascorbic Acid (VITAMIN C PO) Take 1 tablet by mouth daily.   aspirin EC 81 MG tablet Take daily   bisoprolol-hctz 5-6.25 MG  TAKE 1 TABLET DAILY    Vitamin D 5,000 Units  Take every evening.     divalproex 250 mg  tablet Take 2 tablets 2  times daily.    Allergies  Allergen Reactions   Ciprofloxacin Diarrhea    PMHx:   Past Medical History:  Diagnosis Date   Arthritis    Bipolar disorder ([place])    controlled by meds since 1997   Pneumonia    Sleep apnea    cpap   Wears glasses    reading     Immunization History  Administered Date(s) Administered   Moderna Sars-Covid-2 Vaccination 07/02/2019, 07/30/2019   PFIZER(Purple Top)SARS-COV-2 Vaccination 03/13/2020   Pneumococcal Conjugate-13 04/05/2017   Pneumococcal Polysaccharide-23 11/22/2018   Td 11/06/2017    Past Surgical History:  Procedure Laterality Date   APPENDECTOMY     COLONOSCOPY     KNEE ARTHROSCOPY W/ MENISCAL REPAIR  2004   left   ORIF HUMERUS FRACTURE Left 09/19/2013   Procedure: LEFT OPEN REDUCTION INTERNAL FIXATION (ORIF) PROXIMAL HUMERUS; ROTATOR CUFF REPAIR;  Surgeon: [name], MD;  Location: [facility];  Service: Orthopedics;  Laterality: Left;   TONSILLECTOMY     TOTAL HIP ARTHROPLASTY Left 07/18/2018   Procedure: TOTAL HIP ARTHROPLASTY ANTERIOR APPROACH;  Surgeon: Rod Can, MD;  Location: [facility];  Service: Orthopedics;  Laterality: Left;    FHx:    Reviewed / unchanged  SHx:    Reviewed / unchanged   Systems Review:  Constitutional: Denies fever, chills, wt changes, headaches, insomnia, fatigue, night sweats, change in appetite. Eyes: Denies redness, blurred vision, diplopia, discharge, itchy, watery eyes.  ENT: Denies discharge, congestion, post nasal drip, epistaxis, sore throat, earache, hearing loss, dental pain, tinnitus, vertigo, sinus pain, snoring.  CV: Denies chest pain, palpitations, irregular heartbeat, syncope, dyspnea, diaphoresis, orthopnea, PND, claudication or edema. Respiratory: denies cough, dyspnea, DOE, pleurisy, hoarseness, laryngitis, wheezing.  Gastrointestinal: Denies dysphagia, odynophagia, heartburn, reflux, water brash, abdominal pain  or cramps, nausea, vomiting, bloating, diarrhea, constipation, hematemesis, melena, hematochezia  or hemorrhoids. Genitourinary: Denies dysuria, frequency, urgency, nocturia, hesitancy, discharge, hematuria or flank pain. Musculoskeletal: Denies arthralgias, myalgias, stiffness, jt. swelling, pain, limping or strain/sprain.  Skin: Denies pruritus, rash, hives, warts, acne, eczema or change in skin lesion(s). Neuro: No weakness, tremor, incoordination, spasms, paresthesia or pain. Psychiatric: Denies confusion, memory loss or sensory loss. Endo: Denies change in weight, skin or hair change.  Heme/Lymph: No excessive bleeding, bruising or enlarged lymph nodes.  Physical Exam  BP 132/80   Pulse 69   Temp (!) 97.5 F (36.4 C)   Resp 16   Ht 5\' 9"  (1.753 m)   Wt 182 lb 6.4 oz (82.7 kg)   SpO2 97%   BMI 26.94 kg/m   Appears  well nourished, well groomed  and in no distress.  Eyes: PERRLA, EOMs, conjunctiva no swelling or erythema. Sinuses: No frontal/maxillary tenderness ENT/Mouth: EAC's clear, TM's nl w/o erythema, bulging. Nares clear w/o erythema, swelling, exudates. Oropharynx clear without erythema or exudates. Oral hygiene is good. Tongue normal, non obstructing. Hearing intact.  Neck: Supple. Thyroid not palpable. Car 2+/2+ without bruits, nodes or JVD. Chest: Respirations nl with BS clear & equal w/o rales, rhonchi, wheezing or stridor.  Cor: Heart sounds normal w/ regular rate and rhythm without sig. murmurs, gallops, clicks or rubs. Peripheral pulses normal and equal  without edema.  Abdomen: Soft & bowel sounds normal. Non-tender w/o guarding, rebound, hernias, masses or organomegaly.  Lymphatics: Unremarkable.  Musculoskeletal: Full ROM all peripheral extremities, joint stability, 5/5 strength and normal gait.  Skin: Warm, dry without exposed rashes, lesions or ecchymosis apparent.  Neuro: Cranial nerves intact, reflexes equal bilaterally. Sensory-motor testing grossly intact.  Tendon reflexes grossly intact.  Pysch: Alert & oriented x 3.  Insight and judgement nl & appropriate. No ideations.  Assessment and Plan:  1. Essential hypertension  - Continue medication, monitor blood pressure at home.  - Continue DASH diet.  Reminder to go to the ER if any CP,  SOB, nausea, dizziness, severe HA, changes vision/speech.   - CBC with Differential/Platelet - COMPLETE METABOLIC PANEL WITH GFR - Magnesium - TSH  2. Hyperlipidemia, mixed  - Continue diet/meds, exercise,& lifestyle modifications.  - Continue monitor periodic cholesterol/liver & renal functions    - Lipid panel - TSH  3. Abnormal glucose  - Continue diet, exercise  - Lifestyle modifications.  - Monitor appropriate labs   - Hemoglobin A1c - Insulin, random  4. Vitamin D deficiency  - Continue supplementation  - VITAMIN D 25 Hydroxy  5. OSA on CPAP  6. Unstable gait  7. Medication management  - CBC with Differential/Platelet - COMPLETE METABOLIC PANEL WITH GFR - Magnesium - Lipid panel - TSH - Hemoglobin A1c - Insulin, random - VITAMIN D 25 Hydroxy        Discussed  regular exercise, BP monitoring, weight control to achieve/maintain BMI less than 25 and discussed med and SE's. Recommended labs to assess and monitor clinical status with further disposition pending results of labs.  I discussed the assessment and treatment plan with the patient. The patient was provided an opportunity to ask questions and all were answered. The patient agreed with the plan and demonstrated an understanding of the instructions.  I provided over 30 minutes of exam, counseling, chart review and  complex critical decision making.        The patient was advised to call back or seek an in-person evaluation if the symptoms worsen or if the condition fails to improve as anticipated.   Kirtland Bouchard, MD

## 2021-02-19 LAB — COMPLETE METABOLIC PANEL WITH GFR
AG Ratio: 1.6 (calc) (ref 1.0–2.5)
ALT: 13 U/L (ref 9–46)
AST: 15 U/L (ref 10–35)
Albumin: 4.2 g/dL (ref 3.6–5.1)
Alkaline phosphatase (APISO): 55 U/L (ref 35–144)
BUN/Creatinine Ratio: 29 (calc) — ABNORMAL HIGH (ref 6–22)
BUN: 30 mg/dL — ABNORMAL HIGH (ref 7–25)
CO2: 28 mmol/L (ref 20–32)
Calcium: 9.6 mg/dL (ref 8.6–10.3)
Chloride: 101 mmol/L (ref 98–110)
Creat: 1.03 mg/dL (ref 0.70–1.28)
Globulin: 2.6 g/dL (calc) (ref 1.9–3.7)
Glucose, Bld: 90 mg/dL (ref 65–99)
Potassium: 4.2 mmol/L (ref 3.5–5.3)
Sodium: 137 mmol/L (ref 135–146)
Total Bilirubin: 0.3 mg/dL (ref 0.2–1.2)
Total Protein: 6.8 g/dL (ref 6.1–8.1)
eGFR: 78 mL/min/{1.73_m2} (ref >=60)

## 2021-02-19 LAB — CBC WITH DIFFERENTIAL/PLATELET
Absolute Monocytes: 818 cells/uL (ref 200–950)
Basophils Absolute: 33 cells/uL (ref 0–200)
Basophils Relative: 0.3 %
Eosinophils Absolute: 131 cells/uL (ref 15–500)
Eosinophils Relative: 1.2 %
HCT: 44.9 % (ref 38.5–50.0)
Hemoglobin: 14.9 g/dL (ref 13.2–17.1)
Lymphs Abs: 3259 cells/uL (ref 850–3900)
MCH: 30.6 pg (ref 27.0–33.0)
MCHC: 33.2 g/dL (ref 32.0–36.0)
MCV: 92.2 fL (ref 80.0–100.0)
MPV: 11.4 fL (ref 7.5–12.5)
Monocytes Relative: 7.5 %
Neutro Abs: 6660 cells/uL (ref 1500–7800)
Neutrophils Relative %: 61.1 %
Platelets: 197 10*3/uL (ref 140–400)
RBC: 4.87 10*6/uL (ref 4.20–5.80)
RDW: 12.6 % (ref 11.0–15.0)
Total Lymphocyte: 29.9 %
WBC: 10.9 10*3/uL — ABNORMAL HIGH (ref 3.8–10.8)

## 2021-02-19 LAB — LIPID PANEL
Cholesterol: 157 mg/dL (ref <200)
HDL: 58 mg/dL (ref >=40)
LDL Cholesterol (Calc): 80 mg/dL (calc)
Non-HDL Cholesterol (Calc): 99 mg/dL (calc) (ref <130)
Total CHOL/HDL Ratio: 2.7 (calc) (ref <5.0)
Triglycerides: 108 mg/dL (ref <150)

## 2021-02-19 LAB — MAGNESIUM: Magnesium: 2 mg/dL (ref 1.5–2.5)

## 2021-02-19 LAB — TSH: TSH: 0.39 mIU/L — ABNORMAL LOW (ref 0.40–4.50)

## 2021-02-19 LAB — HEMOGLOBIN A1C
Hgb A1c MFr Bld: 5 % of total Hgb (ref <5.7)
Mean Plasma Glucose: 97 mg/dL
eAG (mmol/L): 5.4 mmol/L

## 2021-02-19 LAB — INSULIN, RANDOM: Insulin: 10.8 u[IU]/mL

## 2021-02-19 LAB — VITAMIN D 25 HYDROXY (VIT D DEFICIENCY, FRACTURES): Vit D, 25-Hydroxy: 77 ng/mL (ref 30–100)

## 2021-02-19 NOTE — Progress Notes (Signed)
============================================================ -   Test results slightly outside the reference range are not unusual. If there is anything important, I will review this with you,  otherwise it is considered normal test values.  If you have further questions,  please do not hesitate to contact me at the office or via My Chart.  ============================================================ ============================================================  -  BUN  ~ Kidney function test suggests   a little dehydrated    Very important to drink adequate amounts of fluids                                                                              to prevent permanent damage    - Recommend            drink at least 6 bottles (16 ounces) of fluids /water /day = 96 Oz ~100 oz  - 100 oz = 3,000 cc or 3 liters / day  - >>                                              That's 1 &1/2 bottles of a 2 liter soda bottle /day !  ============================================================ ============================================================  -  Total Chol = 157    &    LDL Chol = 80  -    Both    Excellent   - Very low risk for Heart Attack  / Stroke ============================================================ ============================================================  -  Thyroid is Borderline - will continue to monitor closely at next lab draw ============================================================ ============================================================  -  A1c - Normal - No Diabetes  - Great ! ============================================================ ============================================================  -  Vitamin D = 77 - Excellent ! ============================================================ ============================================================  -  All Else - CBC - Electrolytes - Liver &  Magnesium  - all  Normal /  OK ============================================================ ============================================================

## 2021-02-21 ENCOUNTER — Encounter: Payer: Self-pay | Admitting: Internal Medicine

## 2021-02-24 ENCOUNTER — Other Ambulatory Visit: Payer: Self-pay | Admitting: Adult Health

## 2021-02-24 DIAGNOSIS — I10 Essential (primary) hypertension: Secondary | ICD-10-CM

## 2021-03-26 NOTE — Progress Notes (Addendum)
NEUROLOGY FOLLOW UP OFFICE NOTE  Timothy [name] [id number]  Assessment/Plan:   Gradually worsening bilateral leg weakness, although left hip flexion is objectively most prominent - peripheral nervous system etiologies such as myopathy or lumbosacral stenosis, negative.  Lumbar myelogram does reveal some nerve impingement but nothing that would be causing weakness.  Will evaluate for CNS etiology History of left total hip replacement  1  MRI of cervical and thoracic spine without contrast.  If unremarkable, would check MRI of brain 2  Follow up after testing  04/30/2021 ADDENDUM: MRI of cervical and thoracic spine on 04/20/2021 showed degenerative changes but no spinal canal stenosis causing myelopathy.  MRI of brain on 04/29/2021 showed incidental remote small infarct in the medial right parietal lobe and within the posterior left frontal white matter but no explanation for his subjective bilateral lower extremity weakness.  As previously noted, CT lumbar myelogram and NCV-EMG also did not demonstrate explainable etiology.  On my exam, the only objective finding that I appreciate is some left proximal upper extremity weakness and left hip flexion weakness which can be explained by his prior shoulder injury and prior hip history.  I do not have a neurologic explanation for his bilateral lower extremity weakness. Metta Clines, DO  Subjective:  Timothy [name] is a 70 year old male with HTN, prediabetes, OSA on CPAP, IBS and Bipolar Disorder who follows up with unsteady gait and lower extremity weakness.  UPDATE: CT lumbar myelogram on 10/02/2020 personally reviewed multilevel degenerative disc disease with left foraminal stenosis at L3-4, bilateral foraminal stenosis at L4-5 (affecting either L4 nerve) and right foraminal stenosis at L5-S1 (potentially affecting L5 nerve).  NCV-EMG of bilateral lower extremities on 12/01/2020 showed mild chronic right L5 radiculopathy but otherwise unremarkable.    HISTORY: He had left total hip replacement in February 2020.  Following the procedure, he continued to experience left hip pain, causing him to limp.  He slowly improved but after a few months gradually felt weaker again.  He has some localized back pain but denies numbness and pain in the legs.  He is able to stand up from a chair but reports effort.  When he went snorkeling last summer, he had trouble kicking his legs.  Denies change in bowel or bladder function.  Denies perineal numbness.  Upper extremities feel fine.  Denies double vision, neck weakness, dysphagia or dyspnea.  He returned to his orthopedist, who told him that his hip was okay.  He was referred to neurosurgeon who told him that he had degenerative disc disease and recommended physical therapy but that surgery was not indicated.  He continues to have low back pain and left hip pain, which he treats with Aleve.  He feels both legs are weak, left greater than right.  No numbness or radicular pain down the leg.  No bowel or bladder dysfunction.  He received L5-S1 interlaminar injection in December 2021 without relief.    PAST MEDICAL HISTORY: Past Medical History:  Diagnosis Date   Arthritis    Bipolar disorder ([place])    controlled by meds since 1997   Pneumonia    Sleep apnea    cpap   Wears glasses    reading    MEDICATIONS: Current Outpatient Medications on File Prior to Visit  Medication Sig Dispense Refill   Ascorbic Acid (VITAMIN C PO) Take 1 tablet by mouth daily.     aspirin EC 81 MG tablet Take 81 mg by mouth daily. Swallow whole.  bisoprolol-hydrochlorothiazide (ZIAC) 5-6.25 MG tablet TAKE 1 TABLET BY MOUTH EVERY DAY FOR BLOOD PRESSURE 90 tablet 3   Cholecalciferol (VITAMIN D-3) 125 MCG (5000 UT) TABS Take 5,000 Units by mouth every evening.      divalproex (DEPAKOTE) 250 MG DR tablet Take 2 tablets (500 mg total) by mouth 2 (two) times daily. 360 tablet 3   No current facility-administered medications on file  prior to visit.    ALLERGIES: Allergies  Allergen Reactions   Ciprofloxacin Diarrhea    FAMILY HISTORY: Family History  Adopted: Yes      Objective:  Blood pressure 117/65, pulse 64, height 5\' 9"  (1.753 m), weight 187 lb (84.8 kg), SpO2 95 %. General: No acute distress.  Patient appears well-groomed.   Head:  Normocephalic/atraumatic Eyes:  Fundi examined but not visualized Neck: supple, no paraspinal tenderness, full range of motion Heart:  Regular rate and rhythm Lungs:  Clear to auscultation bilaterally Back: No paraspinal tenderness Neurological Exam: alert and oriented to person, place, and time.  Speech fluent and not dysarthric, language intact.  CN II-XII intact. Bulk and tone normal, muscle strength 4+/5 left hip flexion and left arm abduction (history of fractured shoulder) but otherwise 5/5 throughout.  Sensation to light touch intact.  Deep tendon reflexes 2+ throughout, toes downgoing.  Finger to nose testing intact.  Mildly broad-based gait with slight left limp, Romberg negative.   Metta Clines, DO  CC: [name], MD

## 2021-03-29 ENCOUNTER — Ambulatory Visit (INDEPENDENT_AMBULATORY_CARE_PROVIDER_SITE_OTHER): Payer: PPO | Admitting: Neurology

## 2021-03-29 ENCOUNTER — Other Ambulatory Visit: Payer: Self-pay

## 2021-03-29 ENCOUNTER — Encounter: Payer: Self-pay | Admitting: Neurology

## 2021-03-29 VITALS — BP 117/65 | HR 64 | Ht 69.0 in | Wt 187.0 lb

## 2021-03-29 DIAGNOSIS — R29898 Other symptoms and signs involving the musculoskeletal system: Secondary | ICD-10-CM | POA: Diagnosis not present

## 2021-03-29 DIAGNOSIS — G959 Disease of spinal cord, unspecified: Secondary | ICD-10-CM

## 2021-03-29 DIAGNOSIS — R2681 Unsteadiness on feet: Secondary | ICD-10-CM

## 2021-03-29 NOTE — Patient Instructions (Signed)
Check MRI of cervical and thoracic spine without contrast.  If unremarkable, will check MRI of brain. Follow up after testing.

## 2021-04-02 ENCOUNTER — Encounter: Payer: Self-pay | Admitting: Internal Medicine

## 2021-04-08 DIAGNOSIS — Z86010 Personal history of colonic polyps: Secondary | ICD-10-CM | POA: Diagnosis not present

## 2021-04-08 DIAGNOSIS — K5730 Diverticulosis of large intestine without perforation or abscess without bleeding: Secondary | ICD-10-CM | POA: Diagnosis not present

## 2021-04-08 LAB — HM COLONOSCOPY

## 2021-04-14 ENCOUNTER — Other Ambulatory Visit: Payer: Self-pay | Admitting: Psychiatry

## 2021-04-14 DIAGNOSIS — F319 Bipolar disorder, unspecified: Secondary | ICD-10-CM

## 2021-04-20 ENCOUNTER — Other Ambulatory Visit: Payer: Self-pay

## 2021-04-20 ENCOUNTER — Ambulatory Visit
Admission: RE | Admit: 2021-04-20 | Discharge: 2021-04-20 | Disposition: A | Discharge status: Home / Self Care | Payer: PPO | Source: Ambulatory Visit | Attending: Neurology | Admitting: Neurology

## 2021-04-20 DIAGNOSIS — G959 Disease of spinal cord, unspecified: Secondary | ICD-10-CM

## 2021-04-20 DIAGNOSIS — M50223 Other cervical disc displacement at C6-C7 level: Secondary | ICD-10-CM | POA: Diagnosis not present

## 2021-04-20 DIAGNOSIS — R2681 Unsteadiness on feet: Secondary | ICD-10-CM

## 2021-04-20 DIAGNOSIS — R29898 Other symptoms and signs involving the musculoskeletal system: Secondary | ICD-10-CM

## 2021-04-20 DIAGNOSIS — M47814 Spondylosis without myelopathy or radiculopathy, thoracic region: Secondary | ICD-10-CM | POA: Diagnosis not present

## 2021-04-20 DIAGNOSIS — M50222 Other cervical disc displacement at C5-C6 level: Secondary | ICD-10-CM | POA: Diagnosis not present

## 2021-04-20 DIAGNOSIS — M5124 Other intervertebral disc displacement, thoracic region: Secondary | ICD-10-CM | POA: Diagnosis not present

## 2021-04-20 IMAGING — MR MR CERVICAL SPINE W/O CM
6 series · 39 of 48 positions shown · non-contrast
Comparison: [DATE] MRI cervical spine, [DATE] thoracic
spine radiographs

CLINICAL DATA: Myelopathy, acute or progressive, gait abnormality

EXAM:
MRI CERVICAL AND THORACIC SPINE WITHOUT CONTRAST
TECHNIQUE: Multiplanar and multiecho pulse sequences of the cervical spine, to
include the craniocervical junction and cervicothoracic junction,
and the thoracic spine, were obtained without intravenous contrast.

[Series 2: T2 · sagittal · 3.0mm · 0.41mm/px · 6 of 17 slices shown (1 of 2)]
[im 1/17]
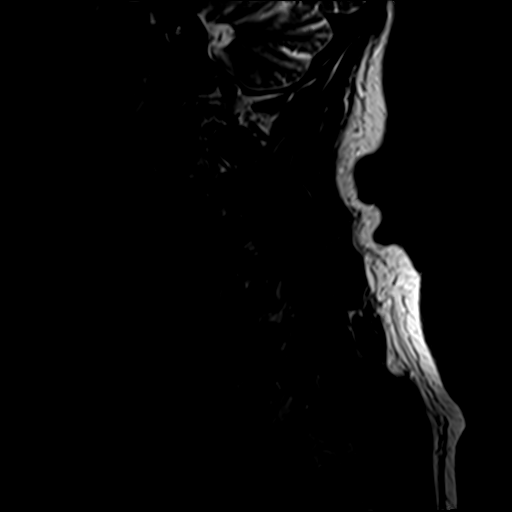
[im 4/17]
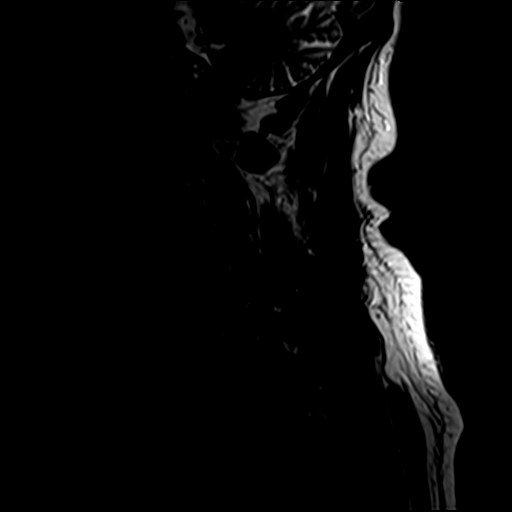
[im 7/17]
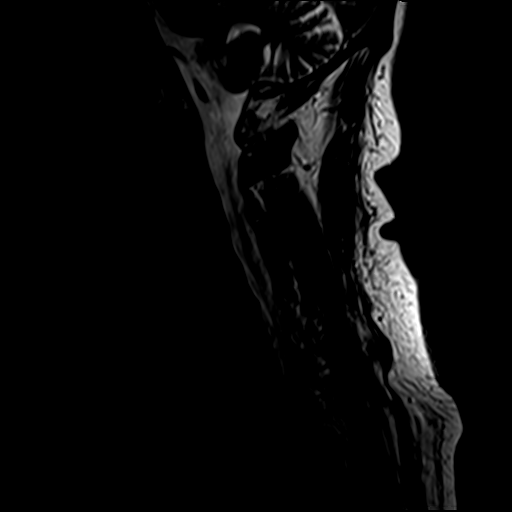
[im 10/17]
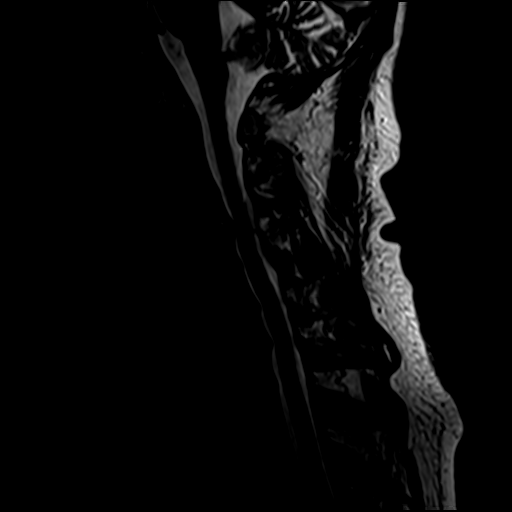
[im 13/17]
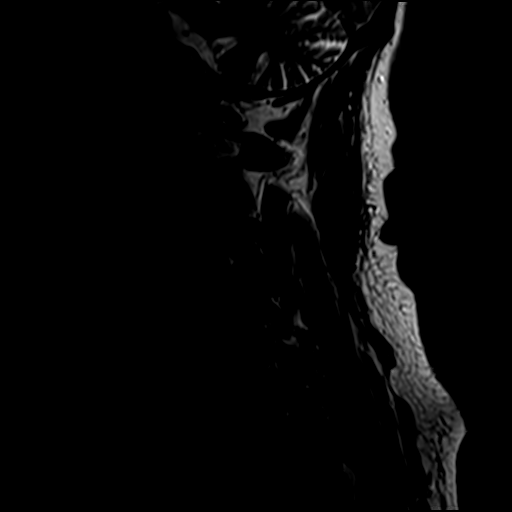
[im 17/17]
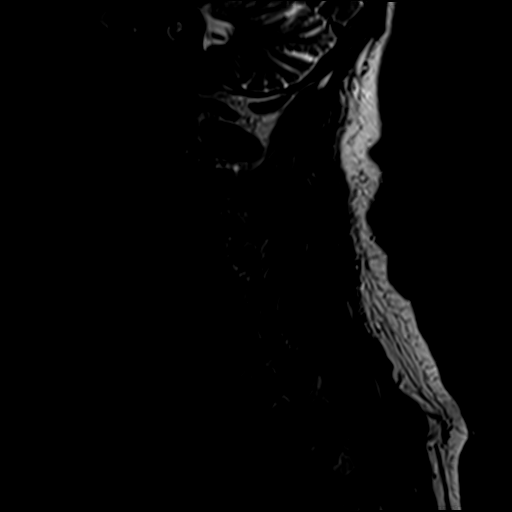

[Series 3: STIR · sagittal · 3.0mm · 0.82mm/px · 6 of 17 slices shown]
[im 1/17]
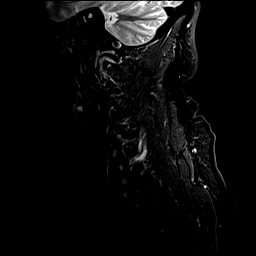
[im 4/17]
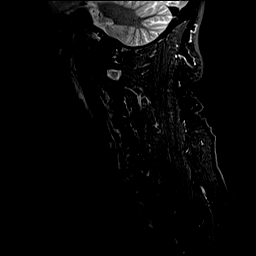
[im 7/17]
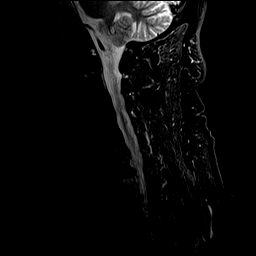
[im 10/17]
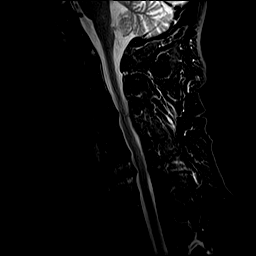
[im 13/17]
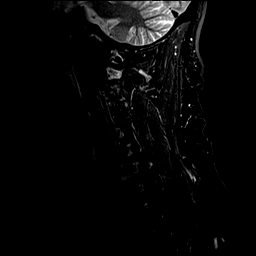
[im 17/17]
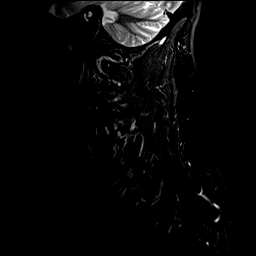

[Series 4: T1 · sagittal · 3.0mm · 0.82mm/px · 7 of 17 slices shown (1 of 2)]
[im 1/17]
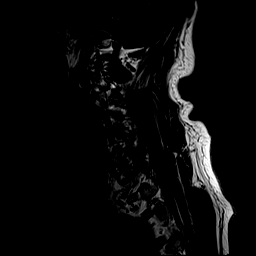
[im 3/17]
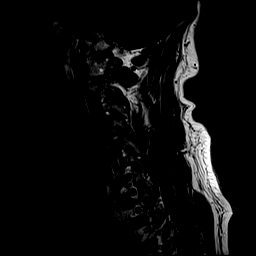
[im 6/17]
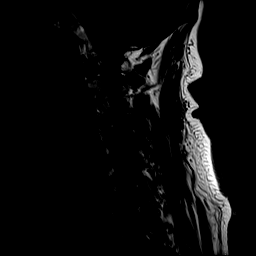
[im 9/17]
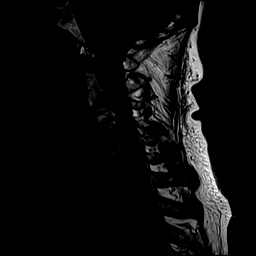
[im 11/17]
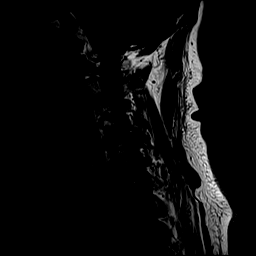
[im 14/17]
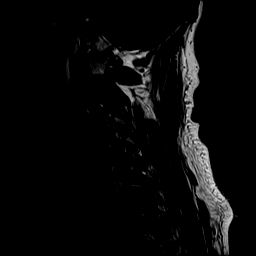
[im 17/17]
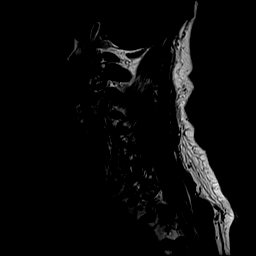

[Series 5: T2 · axial · 3.0mm · 0.70mm/px · z∈[-83,+15]mm · 11 of 28 slices shown (2 of 2)]
[im 1/28]
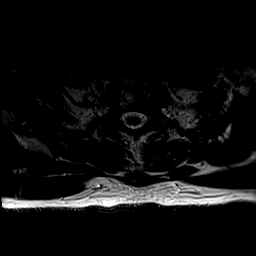
[im 3/28]
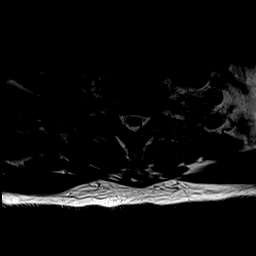
[im 6/28]
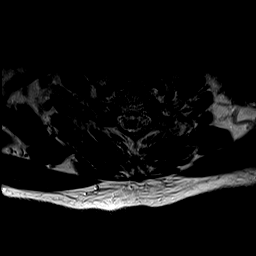
[im 9/28]
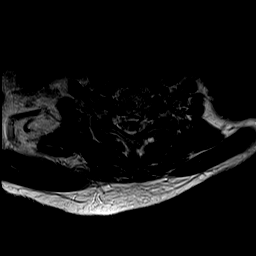
[im 11/28]
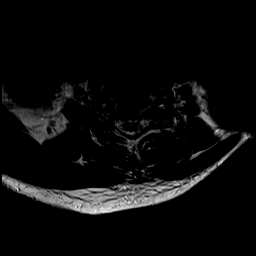
[im 14/28]
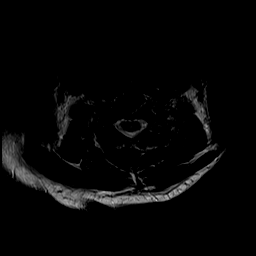
[im 17/28]
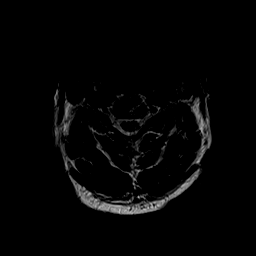
[im 19/28]
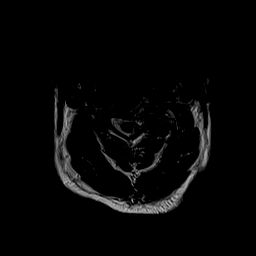
[im 22/28]
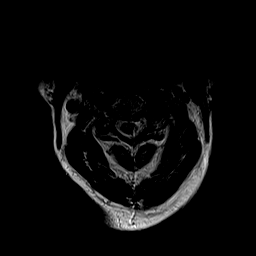
[im 25/28]
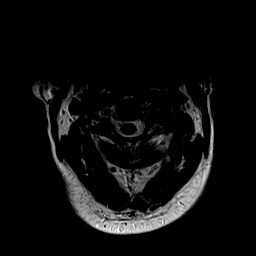
[im 28/28]
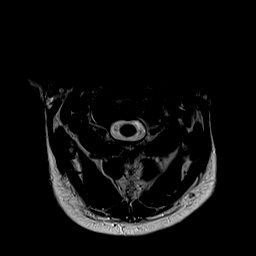

[Series 6: GRE · axial · 3.0mm · 0.35mm/px · z∈[-83,-65]mm · 2 of 28 slices shown]
[im 1/28]
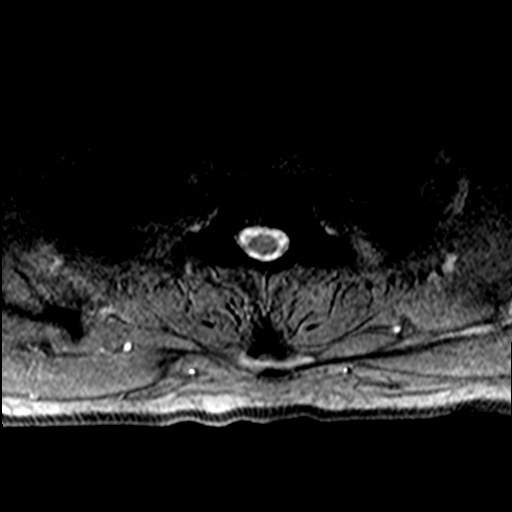
[im 6/28]
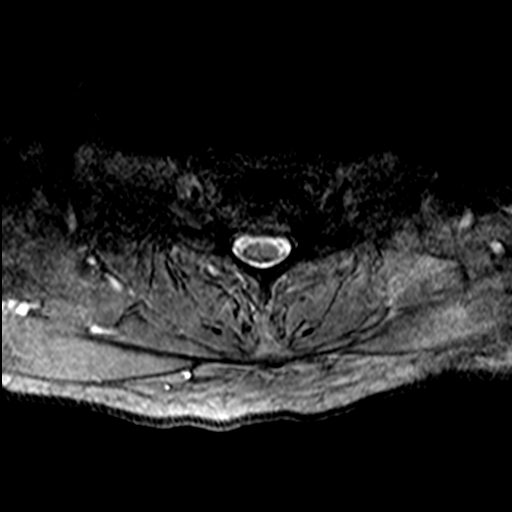

[Series 7: T1 · sagittal · 3.0mm · 0.82mm/px · 7 of 17 slices shown (2 of 2)]
[im 1/17]
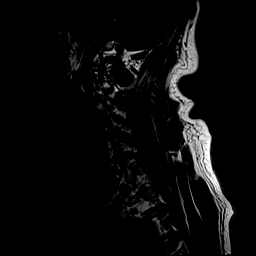
[im 3/17]
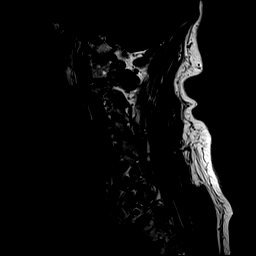
[im 6/17]
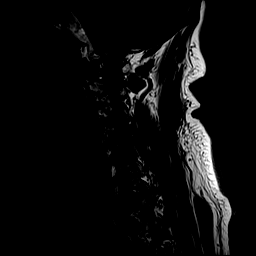
[im 9/17]
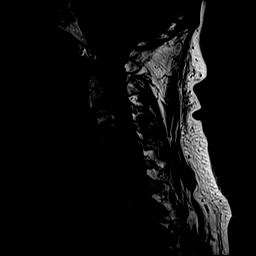
[im 11/17]
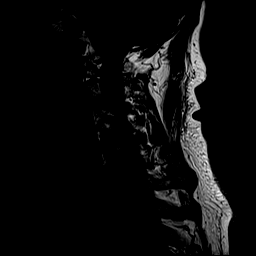
[im 14/17]
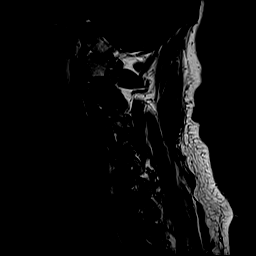
[im 17/17]
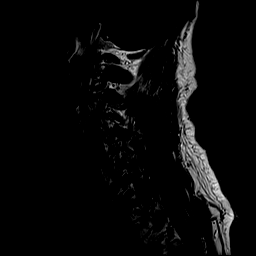

[39 of 48 positions shown; findings below may reference images not displayed]

FINDINGS: MRI CERVICAL SPINE FINDINGS

Evaluation is somewhat limited by motion artifact.

Alignment: Straightening and mild reversal of the normal cervical
lordosis.

Vertebrae: No acute fracture or suspicious osseous lesion.
Multilevel endplate degenerative changes.

Cord: Normal signal and morphology.

Posterior Fossa, vertebral arteries, paraspinal tissues: Negative.

Disc levels:

C2-C3: No significant disc bulge. Left-greater-than-right facet and
uncovertebral hypertrophy, which has progressed from the prior exam.
No spinal canal stenosis. Mild bilateral neural foraminal narrowing.

C3-C4: Disc height loss with left eccentric disc osteophyte complex,
facet arthropathy, and uncovertebral hypertrophy. No spinal canal
stenosis. Moderate left and mild right neural foraminal narrowing,
which have progressed from prior exam.

C4-C5: No significant disc bulge. Facet and uncovertebral
hypertrophy. No spinal canal stenosis. Mild-to-moderate bilateral
neural foraminal narrowing, which has progressed from the prior
exam.

C5-C6: Disc height loss with mild disc bulge. Uncovertebral facet
arthropathy. No spinal canal stenosis. Moderate bilateral neural
foraminal narrowing, which have progressed from the prior exam.

C6-C7: Disc height loss with mild disc bulge. No spinal canal
stenosis. Uncovertebral and facet arthropathy. Moderate left and
moderate to severe right neural foraminal narrowing, which have
progressed from the prior exam.

C7-T1: Disc height loss and minimal disc bulge. No spinal canal
stenosis. Uncovertebral and facet arthropathy. Moderate
right-greater-than-left neural foraminal narrowing, unchanged.

MRI THORACIC SPINE FINDINGS

Alignment:  Trace levocurvature.  No listhesis.

Vertebrae: No acute fracture or suspicious osseous lesions. T1 and
T2 hyperintense lesions, most prominent in T1 and T7, consistent
with benign hemangiomas.

Cord:  Normal signal and morphology.

Paraspinal and other soft tissues: Bilateral renal cysts. Fluid in
the esophagus.

Disc levels:

Small disc bulges in the lower lumbar spine. No spinal canal
stenosis or significant neural foraminal narrowing.
IMPRESSION: 1. No acute fracture or traumatic listhesis in the cervical or
thoracic spine.
2. Progression of previously noted neural foraminal narrowing in the
cervical spine, which is worst at C6-C7, where it is moderate on the
left and moderate to severe on the right. No spinal canal stenosis.
3. Mild degenerative changes in the thoracic spine, without spinal
canal stenosis or neural foraminal narrowing.
4. Fluid in the esophagus, which increases risk for aspiration.

## 2021-04-20 IMAGING — MR MR THORACIC SPINE W/O CM
4 of 6 series · 18 of 48 positions shown · non-contrast
Comparison: [DATE] MRI cervical spine, [DATE] thoracic
spine radiographs

CLINICAL DATA: Myelopathy, acute or progressive, gait abnormality

EXAM:
MRI CERVICAL AND THORACIC SPINE WITHOUT CONTRAST
TECHNIQUE: Multiplanar and multiecho pulse sequences of the cervical spine, to
include the craniocervical junction and cervicothoracic junction,
and the thoracic spine, were obtained without intravenous contrast.

[Series 3: T1 · sagittal · 3.0mm · 1.06mm/px · 3 of 7 slices shown]
[im 1/7]
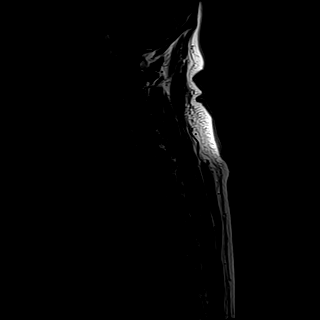
[im 4/7]
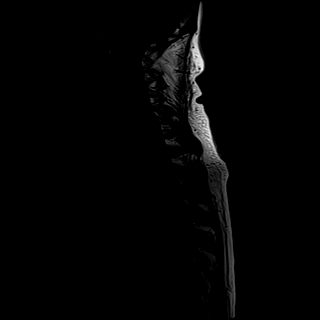
[im 7/7]
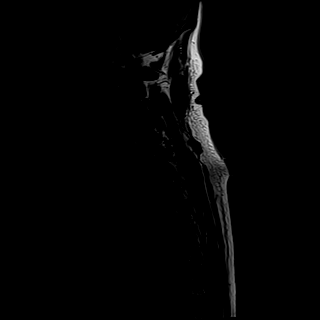

[Series 4: T2 · sagittal · 4.0mm · 0.55mm/px · 7 of 19 slices shown (1 of 3)]
[im 1/19]
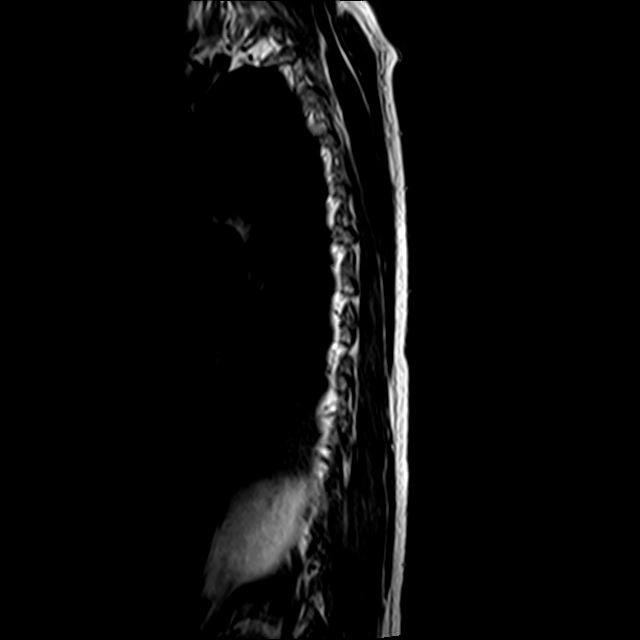
[im 4/19]
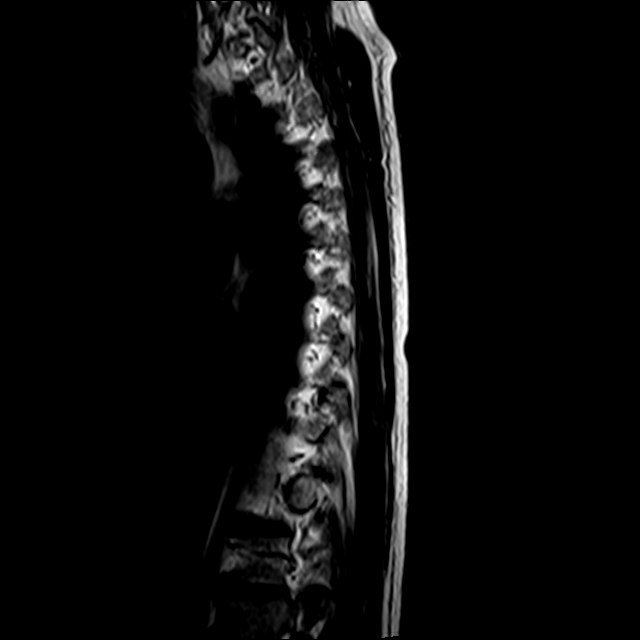
[im 7/19]
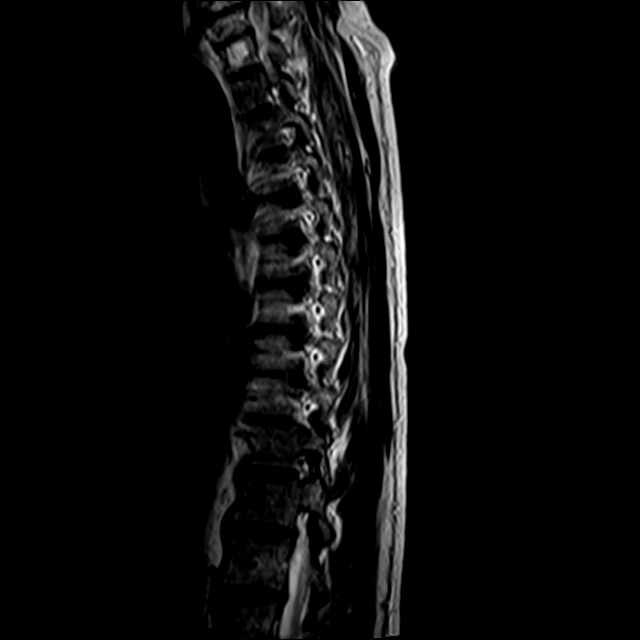
[im 10/19]
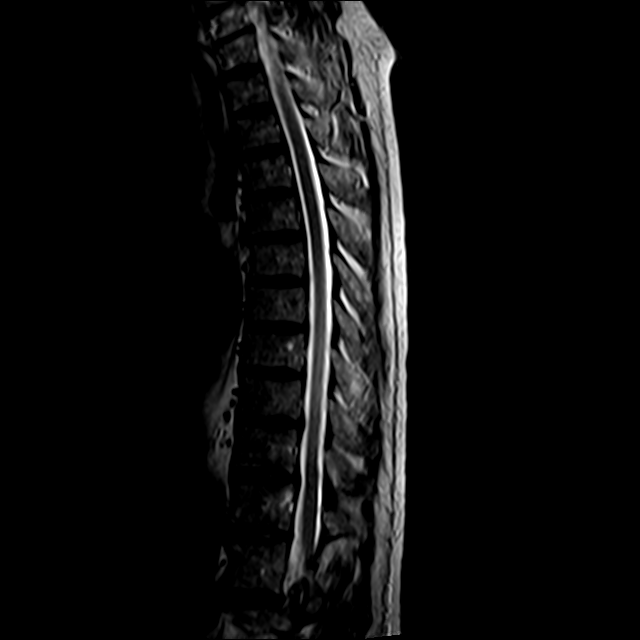
[im 13/19]
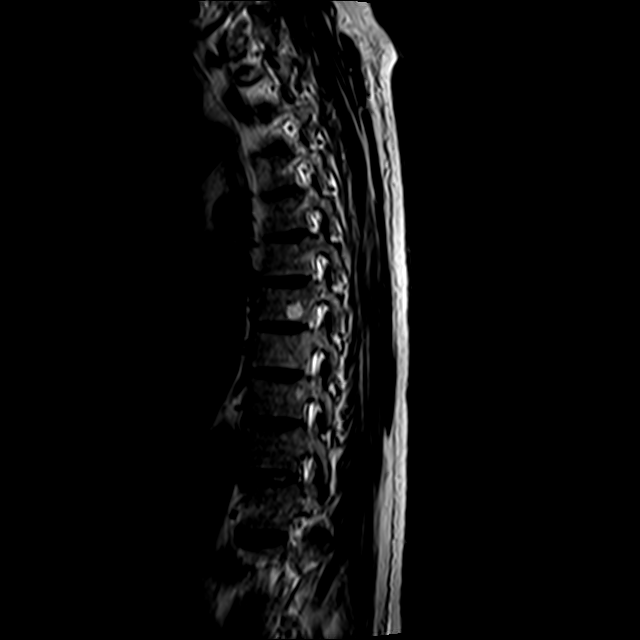
[im 16/19]
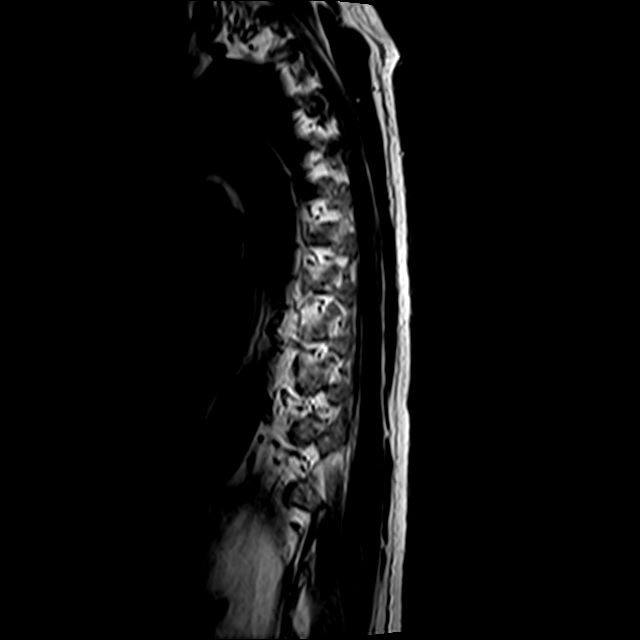
[im 19/19]
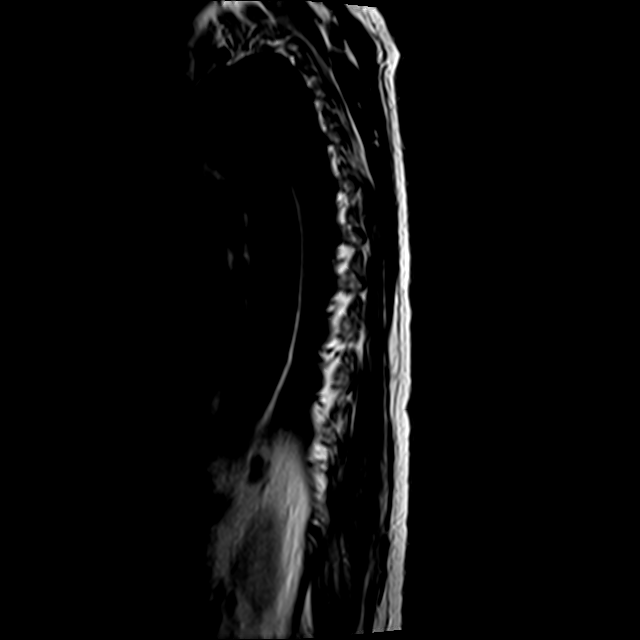

[Series 7: T2 · axial · 4.0mm · 0.39mm/px · z∈[-349,-98]mm · 5 of 40 slices shown (2 of 3)]
[im 1/40]
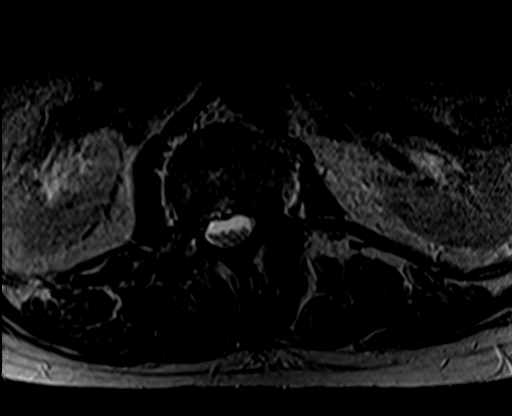
[im 7/40]
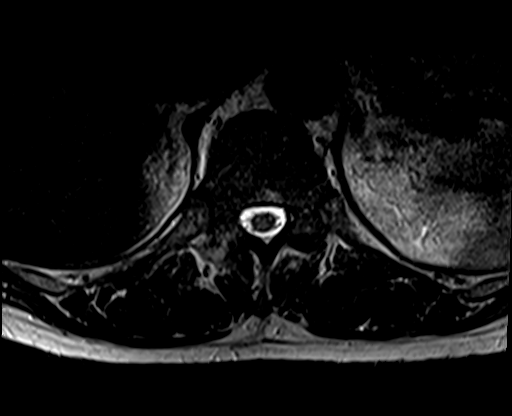
[im 14/40]
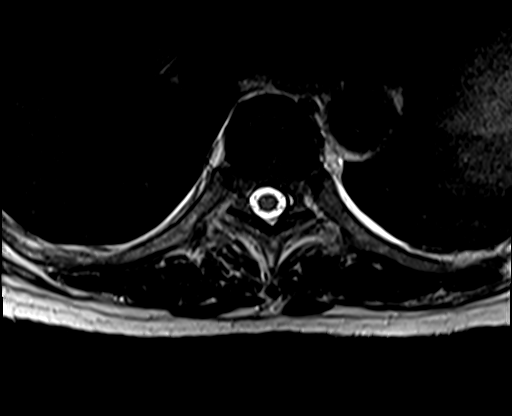
[im 20/40]
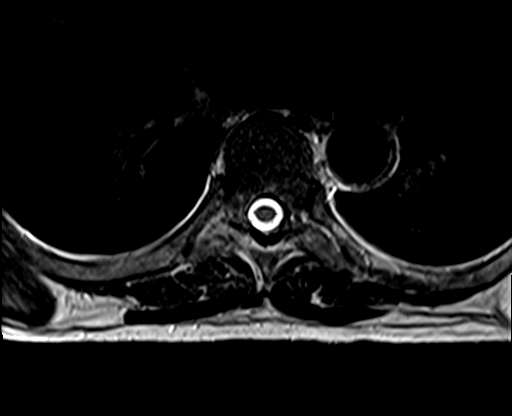
[im 33/40]
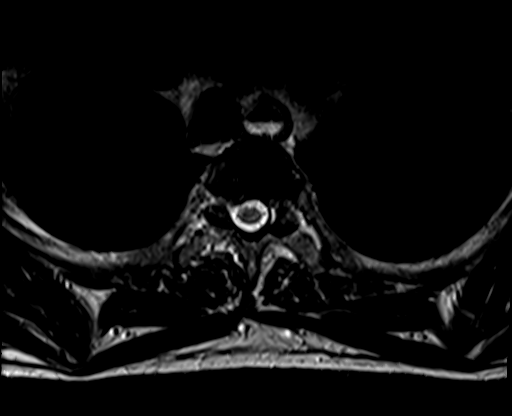

[Series 8: T2 · axial · 4.0mm · 0.39mm/px · z∈[-302,-97]mm · 3 of 40 slices shown (3 of 3)]
[im 7/40]
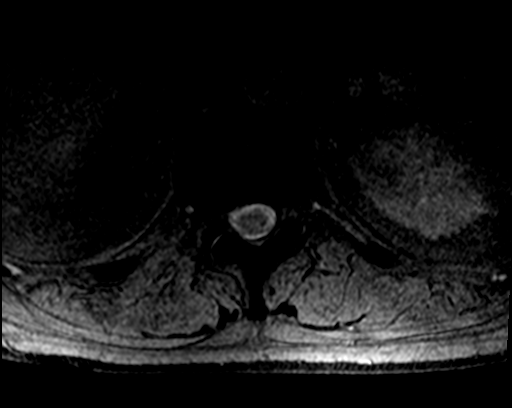
[im 20/40]
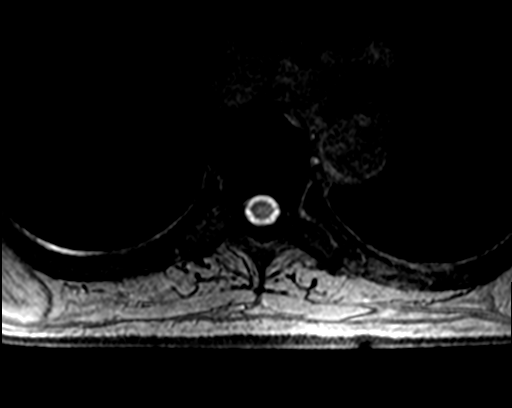
[im 33/40]
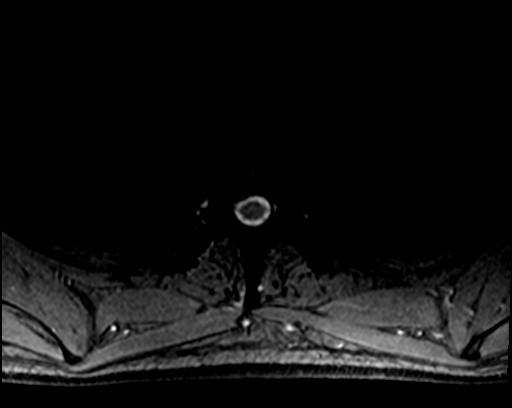

[18 of 48 positions shown; findings below may reference images not displayed]

FINDINGS: MRI CERVICAL SPINE FINDINGS

Evaluation is somewhat limited by motion artifact.

Alignment: Straightening and mild reversal of the normal cervical
lordosis.

Vertebrae: No acute fracture or suspicious osseous lesion.
Multilevel endplate degenerative changes.

Cord: Normal signal and morphology.

Posterior Fossa, vertebral arteries, paraspinal tissues: Negative.

Disc levels:

C2-C3: No significant disc bulge. Left-greater-than-right facet and
uncovertebral hypertrophy, which has progressed from the prior exam.
No spinal canal stenosis. Mild bilateral neural foraminal narrowing.

C3-C4: Disc height loss with left eccentric disc osteophyte complex,
facet arthropathy, and uncovertebral hypertrophy. No spinal canal
stenosis. Moderate left and mild right neural foraminal narrowing,
which have progressed from prior exam.

C4-C5: No significant disc bulge. Facet and uncovertebral
hypertrophy. No spinal canal stenosis. Mild-to-moderate bilateral
neural foraminal narrowing, which has progressed from the prior
exam.

C5-C6: Disc height loss with mild disc bulge. Uncovertebral facet
arthropathy. No spinal canal stenosis. Moderate bilateral neural
foraminal narrowing, which have progressed from the prior exam.

C6-C7: Disc height loss with mild disc bulge. No spinal canal
stenosis. Uncovertebral and facet arthropathy. Moderate left and
moderate to severe right neural foraminal narrowing, which have
progressed from the prior exam.

C7-T1: Disc height loss and minimal disc bulge. No spinal canal
stenosis. Uncovertebral and facet arthropathy. Moderate
right-greater-than-left neural foraminal narrowing, unchanged.

MRI THORACIC SPINE FINDINGS

Alignment:  Trace levocurvature.  No listhesis.

Vertebrae: No acute fracture or suspicious osseous lesions. T1 and
T2 hyperintense lesions, most prominent in T1 and T7, consistent
with benign hemangiomas.

Cord:  Normal signal and morphology.

Paraspinal and other soft tissues: Bilateral renal cysts. Fluid in
the esophagus.

Disc levels:

Small disc bulges in the lower lumbar spine. No spinal canal
stenosis or significant neural foraminal narrowing.
IMPRESSION: 1. No acute fracture or traumatic listhesis in the cervical or
thoracic spine.
2. Progression of previously noted neural foraminal narrowing in the
cervical spine, which is worst at C6-C7, where it is moderate on the
left and moderate to severe on the right. No spinal canal stenosis.
3. Mild degenerative changes in the thoracic spine, without spinal
canal stenosis or neural foraminal narrowing.
4. Fluid in the esophagus, which increases risk for aspiration.

## 2021-04-21 ENCOUNTER — Other Ambulatory Visit: Payer: Self-pay

## 2021-04-21 ENCOUNTER — Telehealth: Payer: Self-pay | Admitting: Internal Medicine

## 2021-04-21 ENCOUNTER — Other Ambulatory Visit: Payer: Self-pay | Admitting: Internal Medicine

## 2021-04-21 DIAGNOSIS — K219 Gastro-esophageal reflux disease without esophagitis: Secondary | ICD-10-CM

## 2021-04-21 DIAGNOSIS — R2681 Unsteadiness on feet: Secondary | ICD-10-CM

## 2021-04-21 DIAGNOSIS — R29898 Other symptoms and signs involving the musculoskeletal system: Secondary | ICD-10-CM

## 2021-04-21 NOTE — Chronic Care Management (AMB) (Signed)
  Chronic Care Management   Outreach Note  04/21/2021 Name: Timothy [name] MRN: [id number] DOB: 1950/10/16  Referred by: [name], MD Reason for referral : No chief complaint on file.   An unsuccessful telephone outreach was attempted today. The patient was referred to the pharmacist for assistance with care management and care coordination.   Follow Up Plan:   Tatjana Dellinger Upstream Scheduler

## 2021-04-21 NOTE — Progress Notes (Unsigned)
MRI brain

## 2021-04-26 ENCOUNTER — Telehealth (HOSPITAL_COMMUNITY): Payer: Self-pay

## 2021-04-26 NOTE — Telephone Encounter (Signed)
Attempted to contact patient to schedule OP MBS - left voicemail. ?

## 2021-04-27 ENCOUNTER — Telehealth: Payer: Self-pay | Admitting: Internal Medicine

## 2021-04-27 ENCOUNTER — Other Ambulatory Visit (HOSPITAL_COMMUNITY): Payer: Self-pay

## 2021-04-27 DIAGNOSIS — R1310 Dysphagia, unspecified: Secondary | ICD-10-CM

## 2021-04-27 NOTE — Progress Notes (Signed)
  Chronic Care Management   Outreach Note  04/27/2021 Name: Timothy [name] MRN: [id number] DOB: Jul 28, 1950  Referred by: [name], MD Reason for referral : No chief complaint on file.   A second unsuccessful telephone outreach was attempted today. The patient was referred to pharmacist for assistance with care management and care coordination.  Follow Up Plan:   Tatjana Dellinger Upstream Scheduler

## 2021-04-29 ENCOUNTER — Ambulatory Visit
Admission: RE | Admit: 2021-04-29 | Discharge: 2021-04-29 | Disposition: A | Discharge status: Home / Self Care | Payer: PPO | Source: Ambulatory Visit | Attending: Neurology | Admitting: Neurology

## 2021-04-29 DIAGNOSIS — I6782 Cerebral ischemia: Secondary | ICD-10-CM | POA: Diagnosis not present

## 2021-04-29 DIAGNOSIS — R29898 Other symptoms and signs involving the musculoskeletal system: Secondary | ICD-10-CM

## 2021-04-29 DIAGNOSIS — R2681 Unsteadiness on feet: Secondary | ICD-10-CM

## 2021-04-29 DIAGNOSIS — I613 Nontraumatic intracerebral hemorrhage in brain stem: Secondary | ICD-10-CM | POA: Diagnosis not present

## 2021-04-29 DIAGNOSIS — G319 Degenerative disease of nervous system, unspecified: Secondary | ICD-10-CM | POA: Diagnosis not present

## 2021-04-29 DIAGNOSIS — I6381 Other cerebral infarction due to occlusion or stenosis of small artery: Secondary | ICD-10-CM | POA: Diagnosis not present

## 2021-04-29 IMAGING — MR MR HEAD WO/W CM
12 series · 48 of 48 positions shown · IV contrast (multihance)
Comparison: Cervical spine MRI [DATE].

CLINICAL DATA: Provided history: Unsteady gait. Weakness of lower
extremity, unspecified laterality. Lower extremity weakness/on
steady gait. Additional history provided by scanning technologist:
weakness.

EXAM:
MRI HEAD WITHOUT AND WITH CONTRAST
TECHNIQUE: Multiplanar, multiecho pulse sequences of the brain and surrounding
structures were obtained without and with intravenous contrast.
CONTRAST:  17mL MULTIHANCE GADOBENATE DIMEGLUMINE 529 MG/ML IV SOLN

[Series 2: T1 · sagittal · 5.0mm · 0.45mm/px · 2 of 24 slices shown]
[im 1/24]
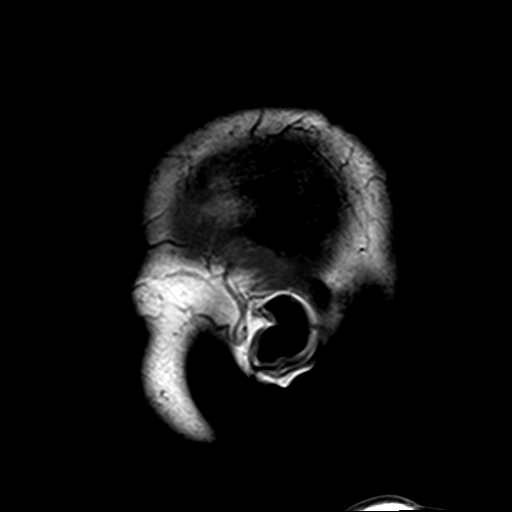
[im 24/24]
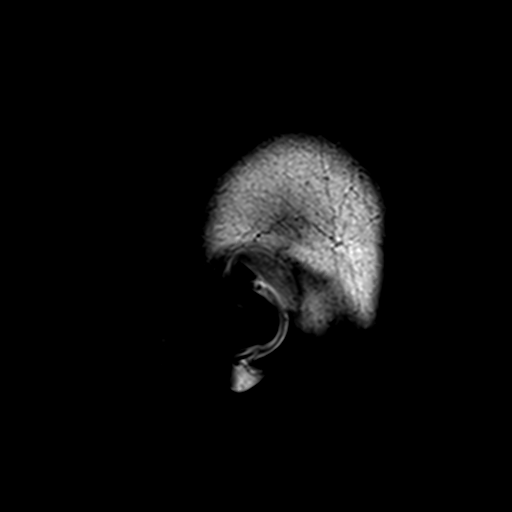

[Series 3: DWI · axial · 3.0mm · 1.80mm/px · z∈[-63,+83]mm · 7 of 100 slices shown (1 of 4)]
[im 1/100]
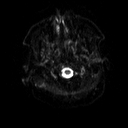
[im 17/100]
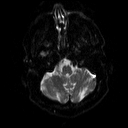
[im 34/100]
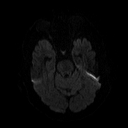
[im 50/100]
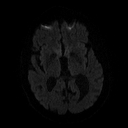
[im 67/100]
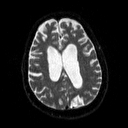
[im 83/100]
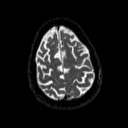
[im 100/100]
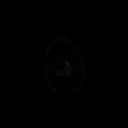

[Series 4: DWI · axial · 3.0mm · 1.80mm/px · z∈[-63,+83]mm · 3 of 50 slices shown (2 of 4)]
[im 1/50]
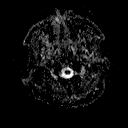
[im 25/50]
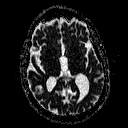
[im 50/50]
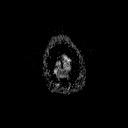

[Series 5: DWI · coronal · 5.0mm · 1.80mm/px · 5 of 72 slices shown (3 of 4)]
[im 1/72]
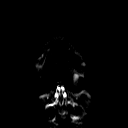
[im 18/72]
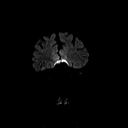
[im 36/72]
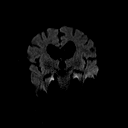
[im 54/72]
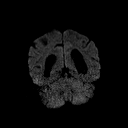
[im 72/72]
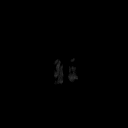

[Series 6: DWI · coronal · 5.0mm · 1.80mm/px · 2 of 36 slices shown (4 of 4)]
[im 1/36]
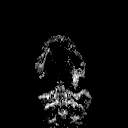
[im 36/36]
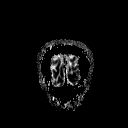

[Series 7: T2 · axial · 5.0mm · 0.60mm/px · 1 of 22 slices shown (1 of 2)]
[im 1/22]
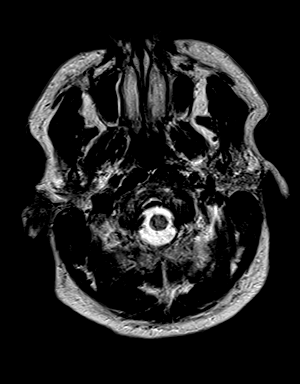

[Series 8: FLAIR · axial · 3.0mm · 0.45mm/px · z∈[-57,+77]mm · 2 of 30 slices shown]
[im 1/30]
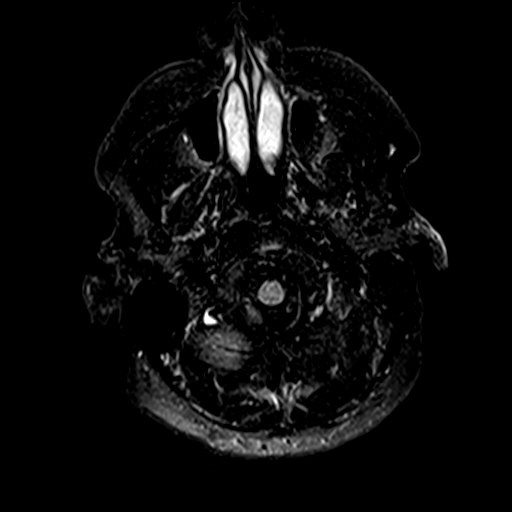
[im 30/30]
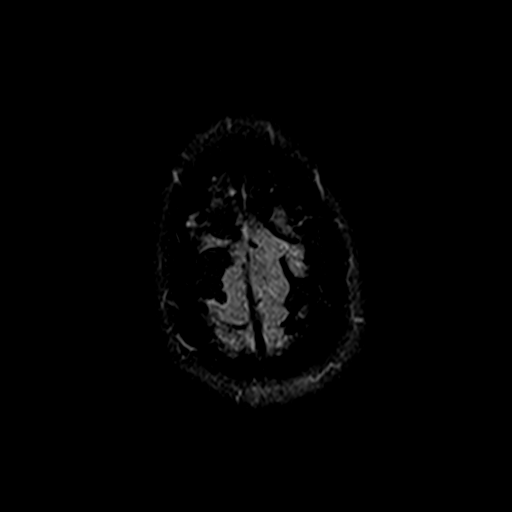

[Series 10: swi_images · axial · 4.0mm · 0.90mm/px · z∈[-59,+80]mm · 2 of 36 slices shown]
[im 1/36]
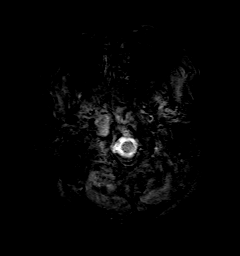
[im 36/36]
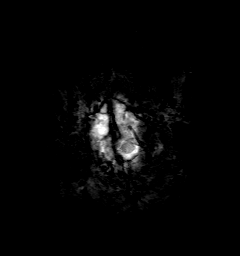

[Series 11: t1_mpr_tra · axial · 1.0mm · 0.75mm/px · z∈[-63,+87]mm · 10 of 144 slices shown]
[im 1/144]
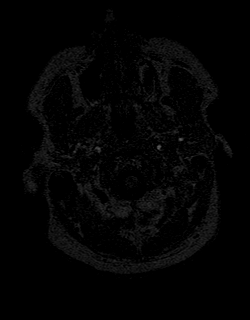
[im 16/144]
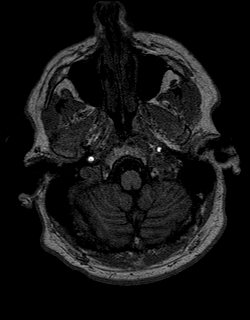
[im 32/144]
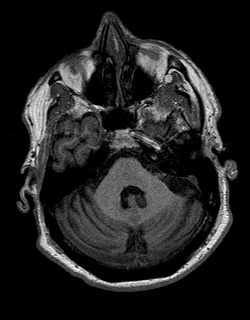
[im 48/144]
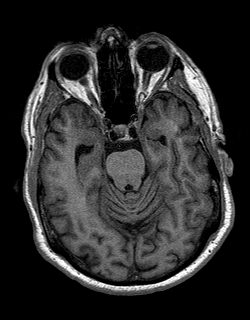
[im 64/144]
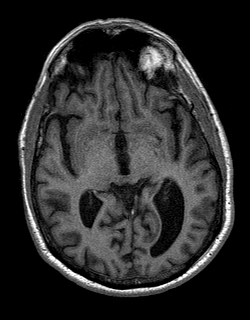
[im 80/144]
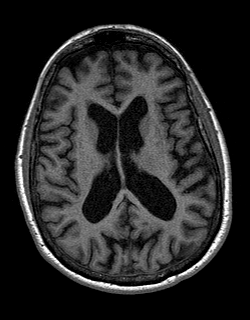
[im 96/144]
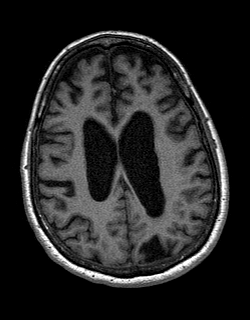
[im 112/144]
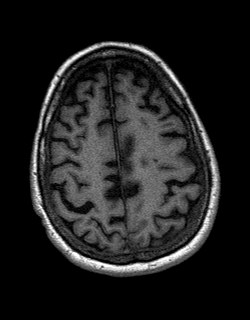
[im 128/144]
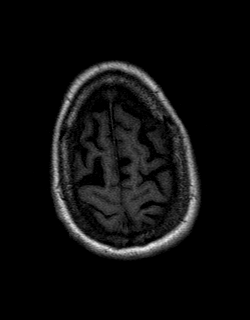
[im 144/144]
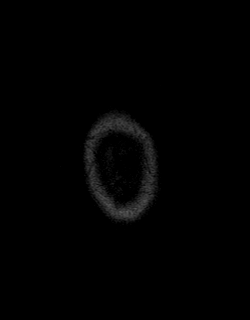

[Series 12: T2 · coronal · 5.0mm · 0.45mm/px · 2 of 27 slices shown (2 of 2)]
[im 1/27]
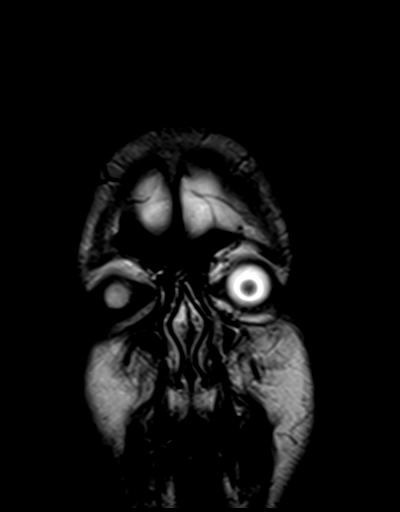
[im 27/27]
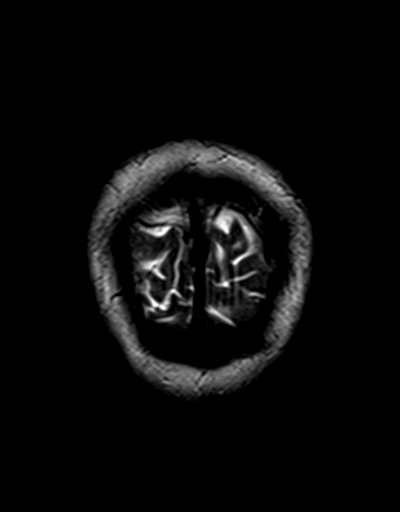

[Series 13: t1_mpr_tra post · axial · 1.0mm · 0.75mm/px · z∈[-63,+87]mm · 10 of 144 slices shown]
[im 1/144]
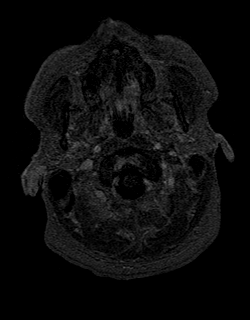
[im 16/144]
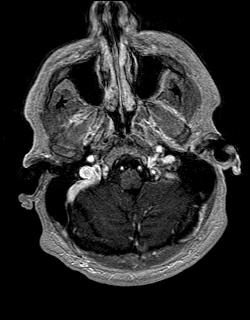
[im 32/144]
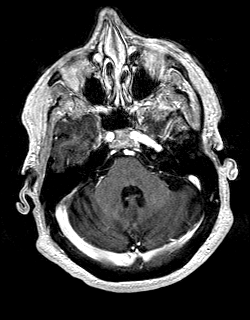
[im 48/144]
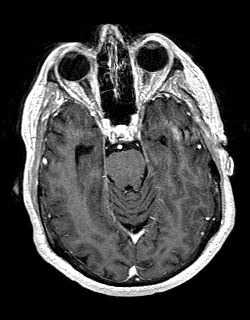
[im 64/144]
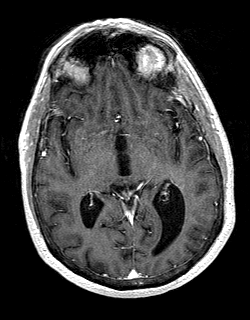
[im 80/144]
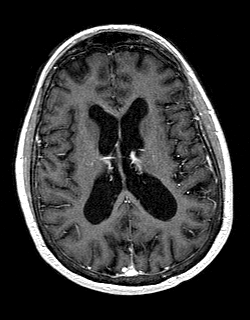
[im 96/144]
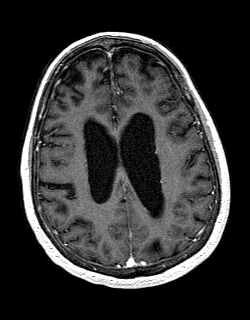
[im 112/144]
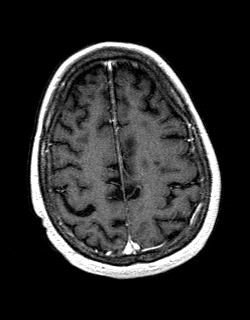
[im 128/144]
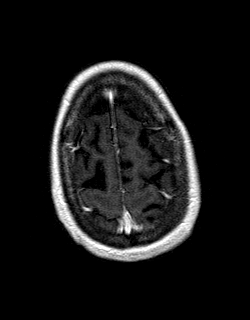
[im 144/144]
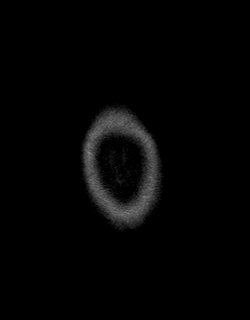

[Series 14: post cor · coronal · 5.0mm · 0.45mm/px · 2 of 27 slices shown]
[im 1/27]
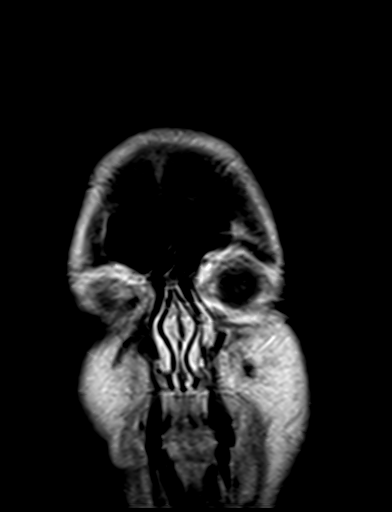
[im 27/27]
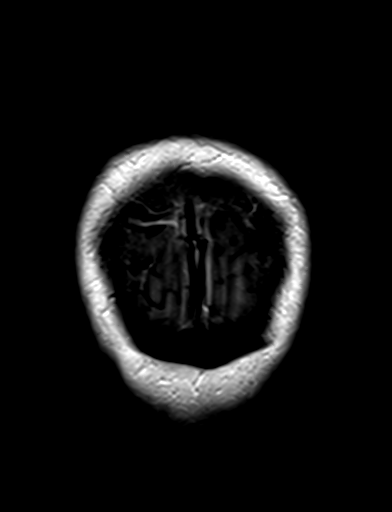

[48 of 48 positions shown; findings below may reference images not displayed]

FINDINGS: Brain:

Mild intermittent motion degradation.

Mild-to-moderate generalized cerebral and cerebellar atrophy.

Small chronic cortically based infarct within the medial right
parietal lobe.

Chronic lacunar infarct within the posterior left frontal lobe white
matter. Background minimal cerebral white matter chronic small
vessel ischemic changes.

Chronic microhemorrhage within the dorsal left medulla (series 10,
image 5).

There is no acute infarct.

No evidence of an intracranial mass.

No extra-axial fluid collection.

No midline shift.

No pathologic intracranial enhancement identified.

Vascular: Maintained flow voids within the proximal large arterial
vessels.

Skull and upper cervical spine: No focal suspicious marrow lesion.
Incompletely assessed cervical spondylosis. Trace C3-C4 grade 1
retrolisthesis.

Sinuses/Orbits: Visualized orbits show no acute finding. Trace
mucosal thickening within the bilateral ethmoid sinuses.
IMPRESSION: Mildly motion degraded exam.

No evidence of acute intracranial abnormality.

Small chronic cortically-based infarct within the medial right
parietal lobe.

Chronic lacunar infarct within the posterior left frontal lobe white
matter. Minimal background cerebral white matter chronic small
vessel ischemic disease.

Mild-to-moderate generalized cerebral and cerebellar atrophy.

## 2021-04-29 MED ORDER — GADOBENATE DIMEGLUMINE 529 MG/ML IV SOLN
17.0000 mL | Freq: Once | INTRAVENOUS | Status: AC | PRN
  Administered 2021-04-29: 17 mL via INTRAVENOUS

## 2021-04-30 NOTE — Progress Notes (Signed)
Pt advised MRI results. Pt wanted to know if Dr.Jaffe think he has parkingson's.  Please Dr.Jaffe and call the him back please.

## 2021-05-03 ENCOUNTER — Telehealth: Payer: Self-pay | Admitting: Internal Medicine

## 2021-05-03 NOTE — Progress Notes (Signed)
Patient advised per [facility] he do not have parkinson's disease.

## 2021-05-03 NOTE — Progress Notes (Signed)
  Chronic Care Management   Outreach Note  05/03/2021 Name: Timothy [name] MRN: [id number] DOB: May 02, 1951  Referred by: [name], MD Reason for referral : No chief complaint on file.   Third unsuccessful telephone outreach was attempted today. The patient was referred to the pharmacist for assistance with care management and care coordination.   Follow Up Plan:   Tatjana Dellinger Upstream Scheduler

## 2021-05-05 ENCOUNTER — Encounter: Payer: Self-pay | Admitting: Internal Medicine

## 2021-05-05 ENCOUNTER — Telehealth: Payer: Self-pay | Admitting: Neurology

## 2021-05-05 NOTE — Telephone Encounter (Signed)
Pt stated that he feels like Dr Tomi Likens is not taken him serious and that he is going to call his PCP to see who he needs to go see. He said our office is off the hook, he wants to go see another specialist

## 2021-05-05 NOTE — Telephone Encounter (Signed)
Patient's wife called with concerns the pateint is experiencing Parkinson's Disease symptoms.  She said she wants him evaluated for that and his symptoms include: gait instability, weakness, drags foot, difficulty standing from seating , balance issues, looks for something to hold onto, frequency and difficulty voiding, and cognitive issues.  She is aware she is not on his DPR and is requesting a call back to the patient for scheduling.

## 2021-05-12 ENCOUNTER — Ambulatory Visit (HOSPITAL_COMMUNITY)
Admission: RE | Admit: 2021-05-12 | Discharge: 2021-05-12 | Disposition: A | Discharge status: Home / Self Care | Payer: PPO | Source: Ambulatory Visit | Attending: Internal Medicine | Admitting: Internal Medicine

## 2021-05-12 ENCOUNTER — Other Ambulatory Visit: Payer: Self-pay

## 2021-05-12 DIAGNOSIS — R1310 Dysphagia, unspecified: Secondary | ICD-10-CM | POA: Insufficient documentation

## 2021-05-12 DIAGNOSIS — K219 Gastro-esophageal reflux disease without esophagitis: Secondary | ICD-10-CM

## 2021-05-12 NOTE — Progress Notes (Addendum)
Modified Barium Swallow Progress Note  Patient Details  Name: Timothy [name] MRN: [id number] Date of Birth: April 21, 1951  Today's Date: 05/12/2021  Modified Barium Swallow completed.  Full report located under Chart Review in the Imaging Section.  Brief recommendations include the following:  Clinical Impression  Pt demonstrates no oropharygneal dysphagia. Esophageal sweep appears WNL.   Swallow Evaluation Recommendations       SLP Diet Recommendations: Regular solids;Thin liquid                              [name], Katherene Ponto 05/12/2021,2:58 PM

## 2021-05-13 ENCOUNTER — Other Ambulatory Visit: Payer: Self-pay | Admitting: Internal Medicine

## 2021-05-13 DIAGNOSIS — M4150 Other secondary scoliosis, site unspecified: Secondary | ICD-10-CM

## 2021-05-13 DIAGNOSIS — R29898 Other symptoms and signs involving the musculoskeletal system: Secondary | ICD-10-CM

## 2021-05-13 DIAGNOSIS — R2681 Unsteadiness on feet: Secondary | ICD-10-CM

## 2021-05-13 DIAGNOSIS — M5416 Radiculopathy, lumbar region: Secondary | ICD-10-CM

## 2021-05-13 NOTE — Progress Notes (Signed)
============================================================ °============================================================ ° °-    Patient advise swallowing study - Normal   ============================================================ ============================================================

## 2021-07-26 ENCOUNTER — Encounter: Payer: Self-pay | Admitting: Internal Medicine

## 2021-07-26 NOTE — Progress Notes (Signed)
Annual  Screening/Preventative Visit  & Comprehensive Evaluation & Examination  Future Appointments  Date Time Provider Department  07/27/2021  3:00 PM [name], MD [facility]  08/23/2021  3:00 PM Cottle, Billey Co., MD CP-CP  10/26/2021 Timothy:50 AM Pieter Partridge, DO [facility]  11/16/2021  1:30 PM Ward Givens, NP [facility]NA[facility]NA  12/07/2021    3:30 PM Liane Comber, NP [facility]  08/02/2022  3:00 PM [name], MD [facility]            This very nice 71 y.o.  Timothy [name]  presents for a Screening /Preventative Visit & comprehensive evaluation and management of multiple medical co-morbidities.  Patient has been followed for HTN, HLD, Prediabetes and Vitamin D Deficiency. Patient still follows with Dr Clovis Pu for Bipolar Disorder. Patient also is still on CPAP for OSA       Patient  still has ongoing issues with gait with bilateral LE weakness (Rt>Lt)  which he relates following Lt THA in Feb 2020. Patient has been evaluated extensively by Neurology - Dr Tomi Likens with negative Lumbar CT Myelogram, Negative cervical & thoracic spine MRI, Negative Brain MRI and Negative PNCV- EMG. Patient has been tried with physical therapy w/o any perceived benefit. Patient has scheduled evaluation at [facility] on April 17.        HTN predates since 2012. Patient's BP has been controlled at home.  Today's BP is at goal -  118/78. Patient denies any cardiac symptoms as chest pain, palpitations, shortness of breath, dizziness or ankle swelling.       Patient's hyperlipidemia is controlled with diet . Last lipids were at goal :  Lab Results  Component Value Date   CHOL 157 02/18/2021   HDL 58 02/18/2021   LDLCALC 80 02/18/2021   TRIG 108 02/18/2021   CHOLHDL 2.7 02/18/2021         Patient is monitored expectantly for glucose intolerance and patient denies reactive hypoglycemic symptoms, visual blurring, diabetic polys or paresthesias. Last A1c was at goal :   Lab Results  Component Value Date    HGBA1C 5.0 02/18/2021          Finally, patient has history of Vitamin D Deficiency ("28" /2017) and last vitamin D was at goal :   Lab Results  Component Value Date   VD25OH 77 02/18/2021     Current Outpatient Medications on File Prior to Visit  Medication Sig   VITAMIN C  Take 1 tablet daily.   aspirin EC 81 MG tablet Take  daily   bisoprolol-hctz 5-6.25 MG tablet TAKE 1 TABLET EVERY DAY    VITAMIN D  5,000  u Take  every evening.    DEPAKOTE 250 MG DR  TAKE 2 TABLETS 2 TIMES DAILY.     Allergies  Allergen Reactions   Ciprofloxacin Diarrhea     Past Medical History:  Diagnosis Date   Arthritis    Bipolar disorder ([place])    controlled by meds since 1997   Pneumonia    Sleep apnea    cpap   Wears glasses    reading     Health Maintenance  Topic Date Due   Zoster Vaccines- Shingrix (1 of 2) Never done   COVID-19 Vaccine (4 - Booster) 12/Timothy/2021   TETANUS/TDAP  06/Timothy/2029   Pneumonia Vaccine 72+ Years old  Completed   Hepatitis C Screening  Completed   HPV VACCINES  Aged Out   INFLUENZA VACCINE  Discontinued     Immunization History  Administered Date(s) Administered   Moderna Sars-Covid-2 Vacc 07/02/2019, 07/30/2019   PFIZER SARS-COV-2 Vacc Timothy/15/2021   Pneumococcal -13 04/05/2017   Pneumococcal -23 11/22/2018   Td 06/Timothy/2019    Last Colon -  10/30/2015 - Dr Deborha Payment recc 5 yr f/u  due June 2023   Past Surgical History:  Procedure Laterality Date   APPENDECTOMY     COLONOSCOPY     KNEE ARTHROSCOPY W/ MENISCAL REPAIR  2004   left   ORIF HUMERUS FRACTURE Left 09/19/2013   LEFT OPEN REDUCTION INTERNAL FIXATION (ORIF) PROXIMAL HUMERUS; ROTATOR CUFF REPAIR;  [name], MD   TONSILLECTOMY     TOTAL HIP ARTHROPLASTY Left 07/18/2018   TOTAL HIP ARTHROPLASTY ANTERIOR APPROACH  Swinteck, Aaron Edelman, MD     Family History  Adopted: Yes     Social History   Socioeconomic History   Marital status: Married      Spouse name: Timothy Timothy [name]    Number of  children: 4 children & 11 Grandchildren    Occupational History   Attorney   Tobacco Use   Smoking status: Never   Smokeless tobacco: Never  Vaping Use   Vaping Use: Never used  Substance Use Topics   Alcohol use: Yes    Comment: 5 days a week   Drug use: No      ROS Constitutional: Denies fever, chills, weight loss/gain, headaches, insomnia,  night sweats or change in appetite. Does c/o fatigue. Eyes: Denies redness, blurred vision, diplopia, discharge, itchy or watery eyes.  ENT: Denies discharge, congestion, post nasal drip, epistaxis, sore throat, earache, hearing loss, dental pain, Tinnitus, Vertigo, Sinus pain or snoring.  Cardio: Denies chest pain, palpitations, irregular heartbeat, syncope, dyspnea, diaphoresis, orthopnea, PND, claudication or edema Respiratory: denies cough, dyspnea, DOE, pleurisy, hoarseness, laryngitis or wheezing.  Gastrointestinal: Denies dysphagia, heartburn, reflux, water brash, pain, cramps, nausea, vomiting, bloating, diarrhea, constipation, hematemesis, melena, hematochezia, jaundice or hemorrhoids Genitourinary: Denies dysuria, frequency, discharge, hematuria or flank pain. Has urgency, nocturia x 2-3 & occasional hesitancy. Musculoskeletal: Denies arthralgia, myalgia, stiffness, Jt. Swelling, pain, limp or strain/sprain. Denies Falls. Skin: Denies puritis, rash, hives, warts, acne, eczema or change in skin lesion Neuro: No weakness, tremor, incoordination, spasms, paresthesia or pain Psychiatric: Denies confusion, memory loss or sensory loss. Denies Depression. Endocrine: Denies change in weight, skin, hair change, nocturia, and paresthesia, diabetic polys, visual blurring or hyper / hypo glycemic episodes.  Heme/Lymph: No excessive bleeding, bruising or enlarged lymph nodes.   Physical Exam  BP 118/78    Pulse 63    Temp 97.9 F (36.6 C)    Resp 17    Ht 5\' 9"  (1.753 m)    Wt 188 lb (85.3 kg)    SpO2 95%    BMI 27.76 kg/m   General  Appearance: Well nourished and well groomed and in no apparent distress.  Eyes: PERRLA, EOMs, conjunctiva no swelling or erythema, normal fundi and vessels. Sinuses: No frontal/maxillary tenderness ENT/Mouth: EACs patent / TMs  nl. Nares clear without erythema, swelling, mucoid exudates. Oral hygiene is good. No erythema, swelling, or exudate. Tongue normal, non-obstructing. Tonsils not swollen or erythematous. Hearing normal.  Neck: Supple, thyroid not palpable. No bruits, nodes or JVD. Respiratory: Respiratory effort normal.  BS equal and clear bilateral without rales, rhonci, wheezing or stridor. Cardio: Heart sounds are normal with regular rate and rhythm and no murmurs, rubs or gallops. Peripheral pulses are normal and equal bilaterally without edema. No aortic or femoral bruits. Chest: symmetric with  normal excursions and percussion.  Abdomen: Soft, with Nl bowel sounds. Nontender, no guarding, rebound, hernias, masses, or organomegaly.  Lymphatics: Non tender without lymphadenopathy.  Musculoskeletal: Full ROM all peripheral extremities and and sl broad based gait. Poor or unstable tandem walk. Cannot balance on either foot. No cog-wheeling.  Skin: Warm and dry without rashes, lesions, cyanosis, clubbing or  ecchymosis.  Neuro: Cranial nerves intact, reflexes equal bilaterally. Normal muscle tone, no cerebellar symptoms. (-) Romberg. No tremor. Nl F- >Nl.    Sensation intact.  Pysch: Alert and oriented x 3 with normal affect, insight and judgment appropriate.   Assessment and Plan  1. Annual Preventative/Screening Exam    2. Essential hypertension  - EKG 12-Lead - [place], RETROPERITNL ABD,  LTD - Urinalysis, Routine w reflex microscopic - Microalbumin / creatinine urine ratio - CBC with Differential/Platelet - COMPLETE METABOLIC PANEL WITH GFR - Magnesium - TSH  3. Hyperlipidemia, mixed  - EKG 12-Lead - [place], RETROPERITNL ABD,  LTD - Lipid panel - TSH  4. Abnormal glucose  -  EKG 12-Lead - [place], RETROPERITNL ABD,  LTD - Hemoglobin A1c - Insulin, random  5. Vitamin D deficiency  - VITAMIN D 25 Hydroxy   6. B12 deficiency  - Vitamin B12  7. OSA on CPAP   8. BPH with obstruction/lower urinary tract symptoms  - Patient desires to try Tamsulosin for his LUTS.   - PSA  9. Screening for colorectal cancer  - POC Hemoccult Bld/Stl   Timothy. Prostate cancer screening  - PSA  11. Screening for ischemic heart disease  - EKG 12-Lead  12. Screening for AAA (aortic abdominal aneurysm)  - [place], RETROPERITNL ABD,  LTD  13. Right lumbar radiculopathy   14. Medication management  - Urinalysis, Routine w reflex microscopic - Microalbumin / creatinine urine ratio - CBC with Differential/Platelet - COMPLETE METABOLIC PANEL WITH GFR - Magnesium - Lipid panel - TSH - Hemoglobin A1c - Insulin, random - VITAMIN D 25 Hydroxy         Patient was counseled in prudent diet, weight control to achieve/maintain BMI less than 25, BP monitoring, regular exercise and medications as discussed.  Discussed med effects and SE's. Routine screening labs and tests as requested with regular follow-up as recommended. Over 40 minutes of exam, counseling, chart review and high complex critical decision making was performed   Kirtland Bouchard, MD

## 2021-07-26 NOTE — Patient Instructions (Signed)

## 2021-07-27 ENCOUNTER — Ambulatory Visit (INDEPENDENT_AMBULATORY_CARE_PROVIDER_SITE_OTHER): Payer: PPO | Admitting: Internal Medicine

## 2021-07-27 ENCOUNTER — Encounter: Payer: Self-pay | Admitting: Internal Medicine

## 2021-07-27 ENCOUNTER — Other Ambulatory Visit: Payer: Self-pay

## 2021-07-27 VITALS — BP 118/78 | HR 63 | Temp 97.9°F | Resp 17 | Ht 69.0 in | Wt 188.0 lb

## 2021-07-27 DIAGNOSIS — Z136 Encounter for screening for cardiovascular disorders: Secondary | ICD-10-CM | POA: Diagnosis not present

## 2021-07-27 DIAGNOSIS — N138 Other obstructive and reflux uropathy: Secondary | ICD-10-CM | POA: Diagnosis not present

## 2021-07-27 DIAGNOSIS — Z1211 Encounter for screening for malignant neoplasm of colon: Secondary | ICD-10-CM

## 2021-07-27 DIAGNOSIS — Z1212 Encounter for screening for malignant neoplasm of rectum: Secondary | ICD-10-CM

## 2021-07-27 DIAGNOSIS — M5416 Radiculopathy, lumbar region: Secondary | ICD-10-CM

## 2021-07-27 DIAGNOSIS — Z0000 Encounter for general adult medical examination without abnormal findings: Secondary | ICD-10-CM | POA: Diagnosis not present

## 2021-07-27 DIAGNOSIS — G4733 Obstructive sleep apnea (adult) (pediatric): Secondary | ICD-10-CM

## 2021-07-27 DIAGNOSIS — E538 Deficiency of other specified B group vitamins: Secondary | ICD-10-CM | POA: Diagnosis not present

## 2021-07-27 DIAGNOSIS — E782 Mixed hyperlipidemia: Secondary | ICD-10-CM | POA: Diagnosis not present

## 2021-07-27 DIAGNOSIS — I10 Essential (primary) hypertension: Secondary | ICD-10-CM

## 2021-07-27 DIAGNOSIS — Z79899 Other long term (current) drug therapy: Secondary | ICD-10-CM

## 2021-07-27 DIAGNOSIS — Z125 Encounter for screening for malignant neoplasm of prostate: Secondary | ICD-10-CM | POA: Diagnosis not present

## 2021-07-27 DIAGNOSIS — R7309 Other abnormal glucose: Secondary | ICD-10-CM

## 2021-07-27 DIAGNOSIS — E559 Vitamin D deficiency, unspecified: Secondary | ICD-10-CM

## 2021-07-27 DIAGNOSIS — Z0001 Encounter for general adult medical examination with abnormal findings: Secondary | ICD-10-CM

## 2021-07-27 DIAGNOSIS — N401 Enlarged prostate with lower urinary tract symptoms: Secondary | ICD-10-CM | POA: Diagnosis not present

## 2021-07-27 MED ORDER — TAMSULOSIN HCL 0.4 MG PO CAPS: ORAL_CAPSULE | ORAL | 3 refills | Status: DC

## 2021-07-28 LAB — COMPLETE METABOLIC PANEL WITH GFR
AG Ratio: 1.5 (calc) (ref 1.0–2.5)
ALT: 13 U/L (ref 9–46)
AST: 16 U/L (ref 10–35)
Albumin: 4.1 g/dL (ref 3.6–5.1)
Alkaline phosphatase (APISO): 47 U/L (ref 35–144)
BUN: 24 mg/dL (ref 7–25)
CO2: 28 mmol/L (ref 20–32)
Calcium: 9.1 mg/dL (ref 8.6–10.3)
Chloride: 103 mmol/L (ref 98–110)
Creat: 0.88 mg/dL (ref 0.70–1.28)
Globulin: 2.8 g/dL (calc) (ref 1.9–3.7)
Glucose, Bld: 90 mg/dL (ref 65–99)
Potassium: 4.2 mmol/L (ref 3.5–5.3)
Sodium: 139 mmol/L (ref 135–146)
Total Bilirubin: 0.4 mg/dL (ref 0.2–1.2)
Total Protein: 6.9 g/dL (ref 6.1–8.1)
eGFR: 92 mL/min/{1.73_m2} (ref >=60)

## 2021-07-28 LAB — MICROALBUMIN / CREATININE URINE RATIO
Creatinine, Urine: 87 mg/dL (ref 20–320)
Microalb Creat Ratio: 6 mcg/mg creat (ref <30)
Microalb, Ur: 0.5 mg/dL

## 2021-07-28 LAB — VITAMIN D 25 HYDROXY (VIT D DEFICIENCY, FRACTURES): Vit D, 25-Hydroxy: 97 ng/mL (ref 30–100)

## 2021-07-28 LAB — CBC WITH DIFFERENTIAL/PLATELET
Absolute Monocytes: 751 cells/uL (ref 200–950)
Basophils Absolute: 38 cells/uL (ref 0–200)
Basophils Relative: 0.4 %
Eosinophils Absolute: 67 cells/uL (ref 15–500)
Eosinophils Relative: 0.7 %
HCT: 43.6 % (ref 38.5–50.0)
Hemoglobin: 14.7 g/dL (ref 13.2–17.1)
Lymphs Abs: 3838 cells/uL (ref 850–3900)
MCH: 30.2 pg (ref 27.0–33.0)
MCHC: 33.7 g/dL (ref 32.0–36.0)
MCV: 89.7 fL (ref 80.0–100.0)
MPV: 11.7 fL (ref 7.5–12.5)
Monocytes Relative: 7.9 %
Neutro Abs: 4807 cells/uL (ref 1500–7800)
Neutrophils Relative %: 50.6 %
Platelets: 188 10*3/uL (ref 140–400)
RBC: 4.86 10*6/uL (ref 4.20–5.80)
RDW: 12.3 % (ref 11.0–15.0)
Total Lymphocyte: 40.4 %
WBC: 9.5 10*3/uL (ref 3.8–10.8)

## 2021-07-28 LAB — LIPID PANEL
Cholesterol: 152 mg/dL (ref <200)
HDL: 53 mg/dL (ref >=40)
LDL Cholesterol (Calc): 79 mg/dL (calc)
Non-HDL Cholesterol (Calc): 99 mg/dL (calc) (ref <130)
Total CHOL/HDL Ratio: 2.9 (calc) (ref <5.0)
Triglycerides: 122 mg/dL (ref <150)

## 2021-07-28 LAB — URINALYSIS, ROUTINE W REFLEX MICROSCOPIC
Bilirubin Urine: NEGATIVE
Glucose, UA: NEGATIVE
Hgb urine dipstick: NEGATIVE
Leukocytes,Ua: NEGATIVE
Nitrite: NEGATIVE
Protein, ur: NEGATIVE
Specific Gravity, Urine: 1.02 (ref 1.001–1.035)
pH: 6 (ref 5.0–8.0)

## 2021-07-28 LAB — HEMOGLOBIN A1C
Hgb A1c MFr Bld: 5.2 % of total Hgb (ref <5.7)
Mean Plasma Glucose: 103 mg/dL
eAG (mmol/L): 5.7 mmol/L

## 2021-07-28 LAB — MAGNESIUM: Magnesium: 2 mg/dL (ref 1.5–2.5)

## 2021-07-28 LAB — VITAMIN B12: Vitamin B-12: 429 pg/mL (ref 200–1100)

## 2021-07-28 LAB — TSH: TSH: 0.78 mIU/L (ref 0.40–4.50)

## 2021-07-28 LAB — PSA: PSA: 1.02 ng/mL (ref <=4.00)

## 2021-07-28 LAB — INSULIN, RANDOM: Insulin: 5 u[IU]/mL

## 2021-07-28 NOTE — Progress Notes (Signed)
=============================================================== °-   Test results slightly outside the reference range are not unusual. If there is anything important, I will review this with you,  otherwise it is considered normal test values.  If you have further questions,  please do not hesitate to contact me at the office or via My Chart.  =============================================================== ===============================================================  -   -  Vitamin B12 =  429      Mildly Low  (Ideal or Goal Vit B12 is between 450 - 1,100)   Low Vit B12 may be associated with Anemia , Fatigue,   Peripheral Neuropathy, Dementia, "Brain Fog", & Depression  - Recommend take a sub-lingual form of Vitamin B12 tablet   1,000 to 5,000 mcg tab that you dissolve under your tongue /Daily   - Can get Baron Sane - best price at [employer] or on [employer] =============================================================== ===============================================================  - PSA - very Low  - Great  ! =============================================================== ===============================================================  - Total Chol = 152           &                 LDL Chol 79            - Both  Excellent   - Very low risk for Heart Attack  / Stroke  =============================================================== ===============================================================  -  TSH is borderline low but not enough to anticipate any issues ,  will recheck at your July office visit  =============================================================== ===============================================================  -  A1c - Normal - No Diabetes - Great ! =============================================================== ===============================================================  -  Vitamin D = 97 - Excellent   ! =============================================================== ===============================================================  -  All Else - CBC - Kidneys - Electrolytes - Liver - Magnesium & Thyroid    - all  Normal / OK  =============================================================== ===============================================================

## 2021-08-16 DIAGNOSIS — G4733 Obstructive sleep apnea (adult) (pediatric): Secondary | ICD-10-CM | POA: Diagnosis not present

## 2021-08-23 ENCOUNTER — Ambulatory Visit: Payer: PRIVATE HEALTH INSURANCE | Admitting: Psychiatry

## 2021-08-31 ENCOUNTER — Ambulatory Visit: Payer: PPO | Admitting: Psychiatry

## 2021-08-31 DIAGNOSIS — Z630 Problems in relationship with spouse or partner: Secondary | ICD-10-CM

## 2021-08-31 DIAGNOSIS — F319 Bipolar disorder, unspecified: Secondary | ICD-10-CM

## 2021-08-31 NOTE — Progress Notes (Signed)
Psychotherapy Progress Note [facility] Timothy [name], Timothy [name]  Patient ID: Timothy [name] [facility])    MRN: [id number] Therapy format: Individual psychotherapy Date: 08/31/2021      Start: 3:10p     Stop: 4:00p     Time Spent: 50 min Location: In-person   Session narrative (presenting needs, interim history, self-report of stressors and symptoms, applications of prior therapy, status changes, and interventions made in session) Last seen 10 months ago, at which time had achieved about 1/2 time living together with [name], albeit uneasily, and still prone to be accused of leering at other women.    Noticeable difficulty with gait coming in, largely dragging right foot.  EHR notes lumbar radiculopathy ongoing and low B12 late February.  As of today, confesses carrying on a secret relationship, an "emotional affair", with a woman he thinks may be a patient of mine.  Relates a nonsexual communication, and not intended sexual, with another woman, for 5 years -- recently discovered.  She is the same woman who he had a dating relationship with in 2018, for 4-5 months, barely sexually active then, smitten the first 6 weeks, then disappointed all summer.  Reviewed his mix of feelings toward the other woman, seems clear he is not presently interested nor pursuing in any way, but still uptight that [name] would think he means more and cannot lay off suspicion of him and pressing him to figure out why he did it.  Seems clear he used to be interested, reaffirmed that their romantic attachment fizzled out early, and she has been some form of comfort since.  Encouraged to consider further.  Therapeutic modalities: Cognitive Behavioral Therapy and Solution-Oriented/Positive Psychology  Mental Status/Observations:  Appearance:   Casual and Neat     Behavior:  Appropriate  Motor:  Affected gait, slowed  Speech/Language:   Clear and Coherent  Affect:  Appropriate  Mood:  anxious  Thought  process:  normal  Thought content:    WNL  Sensory/Perceptual disturbances:    WNL  Orientation:  Fully oriented  Attention:  Good    Concentration:  Good  Memory:  WNL  Insight:    Fair  Judgment:   Good  Impulse Control:  Good   Risk Assessment: Danger to Self: No Self-injurious Behavior: No Danger to Others: No Physical Aggression / Violence: No Duty to Warn: No Access to Firearms a concern: No  Assessment of progress:  stabilized  Diagnosis:   ICD-10-CM   1. Bipolar I disorder ([place]) -- stable  F31.9     2. Relationship problem between partners  Z63.0      Plan:  Consider further his own motivations and history with unnamed woman, be ready to acknowledge that she has meant something, if only as an ex-lover Be clear to allow for [name] to feel threatened, but if needed ask her what she is concerned is happening or would happen Be sure to keep up with medical issues affecting mood and judgment Other recommendations/advice as may be noted above Continue to utilize previously learned skills ad lib Maintain medication as prescribed and work faithfully with relevant prescriber(s) if any changes are desired or seem indicated Call the clinic on-call service, 988/hotline, 911, or present to [facility] or ER if any life-threatening psychiatric crisis Return 1-3 wks as able, for will call, put on cancellation list. Already scheduled visit in this office 10/21/2021.  Blanchie Serve, Timothy Timothy [name], Timothy [name] Clinical Psychologist, [facility] [address] [place], [place] [address] ([phone]

## 2021-09-13 DIAGNOSIS — M5417 Radiculopathy, lumbosacral region: Secondary | ICD-10-CM | POA: Diagnosis not present

## 2021-09-13 DIAGNOSIS — M79604 Pain in right leg: Secondary | ICD-10-CM | POA: Diagnosis not present

## 2021-09-29 DIAGNOSIS — M5417 Radiculopathy, lumbosacral region: Secondary | ICD-10-CM | POA: Diagnosis not present

## 2021-09-29 DIAGNOSIS — R269 Unspecified abnormalities of gait and mobility: Secondary | ICD-10-CM | POA: Diagnosis not present

## 2021-09-30 DIAGNOSIS — R269 Unspecified abnormalities of gait and mobility: Secondary | ICD-10-CM | POA: Diagnosis not present

## 2021-09-30 DIAGNOSIS — M5417 Radiculopathy, lumbosacral region: Secondary | ICD-10-CM | POA: Diagnosis not present

## 2021-10-05 ENCOUNTER — Ambulatory Visit (INDEPENDENT_AMBULATORY_CARE_PROVIDER_SITE_OTHER): Payer: PPO | Admitting: Psychiatry

## 2021-10-05 DIAGNOSIS — E538 Deficiency of other specified B group vitamins: Secondary | ICD-10-CM

## 2021-10-05 DIAGNOSIS — Z630 Problems in relationship with spouse or partner: Secondary | ICD-10-CM | POA: Diagnosis not present

## 2021-10-05 DIAGNOSIS — F319 Bipolar disorder, unspecified: Secondary | ICD-10-CM

## 2021-10-05 NOTE — Progress Notes (Addendum)
Psychotherapy Progress Note [facility] Luan Moore, PhD LP  Patient ID: Timothy [name] [facility])    MRN: [id number] Therapy format: Individual psychotherapy Date: 10/05/2021      Start: 11:15a     Stop: 12:03p     Time Spent: 48 min Location: In-person   Session narrative (presenting needs, interim history, self-report of stressors and symptoms, applications of prior therapy, status changes, and interventions made in session) At wife Sharon's insistence, brings a list of points she wanted him to reveal about the other woman -- how he contacted her while shopping an engagement ring, and flirted in text, visited platonically a few times, until the other woman broke it off.  Acknowledged that these were in some sense reaching out for other connection instead of keeping to the one he had, even though none of these resulted or risked sexual contact.    Addressed pesky requests for "guarantees" he won't cheat, acknowledged there is no such thing as a verbal warranty, and at bottom, [name] needs to determine for herself what her tolerance for risk, and trust, really are.  He has tried recommending her to counseling, but it backfires,  He's also afraid she'll be blithely led to leave if she sees a Social worker of her own.  Advised that his best tool is empathy for the struggle she has to trust.  Encouraged in granting her that that is hard, readying himself to tell her she already knows there is no such thing as a warranty like that, but he does fully intend to remain faithful, and perhaps what she really wants to know is what he would do about it if temptation presented itself.  Covered 5 points of a thorough apology.    Also recommended modest supplementation of B12 or B complex for resiliency with stress and to address his documented suboptimal B12.    Therapeutic modalities: Cognitive Behavioral Therapy and Solution-Oriented/Positive Psychology  Mental  Status/Observations:  Appearance:   Casual     Behavior:  Appropriate  Motor:  Normal except radiculopathy-affected gait  Speech/Language:   Clear and Coherent  Affect:  Appropriate  Mood:  normal  Thought process:  normal  Thought content:    WNL  Sensory/Perceptual disturbances:    WNL  Orientation:  Fully oriented  Attention:  Good    Concentration:  Good  Memory:  WNL  Insight:    Good  Judgment:   Good  Impulse Control:  Good   Risk Assessment: Danger to Self: No Self-injurious Behavior: No Danger to Others: No Physical Aggression / Violence: No Duty to Warn: No Access to Firearms a concern: No  Assessment of progress:  progressing  Diagnosis:   ICD-10-CM   1. Bipolar I disorder ([place]) -- stable  F31.9     2. Relationship problem between partners  Z63.0     3. Low vitamin B12 level  E53.8      Plan:  Draft 5 points of apology Stand ready to empathize with Sharon's struggle to trust, probe what it is she fears would happen, and offer what he would do if temptation presented itself B12 or B complex supplementation unless contraindicated by physician Other recommendations/advice as may be noted above Continue to utilize previously learned skills ad lib Maintain medication as prescribed and work faithfully with relevant prescriber(s) if any changes are desired or seem indicated Call the clinic on-call service, 988/hotline, 911, or present to [facility] or ER if any life-threatening psychiatric crisis Return q 3-4 wks, for  recommend scheduling ahead., earlier as desired/available Already scheduled visit in this office 10/21/2021.  Blanchie Serve, PhD Luan Moore, PhD LP Clinical Psychologist, [facility] [address], [place], [place] [address] [phone]

## 2021-10-21 ENCOUNTER — Ambulatory Visit (INDEPENDENT_AMBULATORY_CARE_PROVIDER_SITE_OTHER): Payer: PPO | Admitting: Psychiatry

## 2021-10-21 ENCOUNTER — Encounter: Payer: Self-pay | Admitting: Psychiatry

## 2021-10-21 DIAGNOSIS — F319 Bipolar disorder, unspecified: Secondary | ICD-10-CM

## 2021-10-21 MED ORDER — DIVALPROEX SODIUM 250 MG PO DR TAB: 500.0000 mg | DELAYED_RELEASE_TABLET | Freq: Two times a day (BID) | ORAL | 3 refills | Status: DC

## 2021-10-21 NOTE — Progress Notes (Signed)
Timothy [name] [id number] December 31, 1950 71 y.o.  Subjective:   Patient ID:  Timothy [name] is a 71 y.o. (DOB [birth year]) male.  Chief Complaint:  Chief Complaint  Patient presents with   Follow-up    HPI Timothy [name] presents to the office today for follow-up of bipolar disorder and has been on Depakote DR 500 mg twice daily for many years.  seen 10/2018.  No meds changed.  Typically seen yearly in recent years because of long-term stability  01/23/20 appt with the following noted: Covid free. Health good.  Sleep 6 1/2 hours with CPAP.  Alert daytime. Overall doing alright mood.  Hip replacement gone bad after 1 and 1/2 year.  Appt Tuesday.  Function otherwise OK. Doesn't plan to retire so still working. No SE. Plan no med changes  08/24/2020 appointment with the following noted: Sep end of December from [place], married less than 2 years.  She's in counseling.  Is being told  This is stressful.  Have seen each other and vacationed together but not living together.  Would like to find out if he has narcissistic tendencies bc she has said this.  She's written hateful things about him.  Asks for therapist to help him figure out if he has personality disorder. Mood stable and good despite stressors.  Patient reports stable mood and denies depressed or irritable moods.  Patient denies any recent difficulty with anxiety.  Patient denies difficulty with sleep initiation or maintenance. Denies appetite disturbance.  Patient reports that energy and motivation have been good.  Patient denies any difficulty with concentration.  Patient denies any suicidal ideation.  Plan continue Depakote 250 mg tablets 2 twice daily  10/21/2021 appointment with the following noted: Worsening  leg weakness R>L with extensive neuro workup without explanation including local and [facility]. Including 2 nerve conduction studies and brain MRI. Sig stressor ongoing relationship and working through with [employer].  She is upset  with him daily. Mood is otherwise stable. No SE with Depakote. Still seeing therapist Jonni Sanger.   Past Psychiatric Medication Trials: Depakote 500 mg twice daily,  Ambien, ProSom, lorazepam,  Effexor, paroxetine 20,  He has been on Depakote alone since 1998  Review of Systems:  Review of Systems  Musculoskeletal:  Positive for arthralgias and gait problem. Negative for back pain.  Neurological:  Negative for tremors and weakness. Hip replacement 06/2018 CPAP since [facility] July 2019. Going well.  Medications: I have reviewed the patient's current medications.  Current Outpatient Medications  Medication Sig Dispense Refill   Ascorbic Acid (VITAMIN C PO) Take 1 tablet by mouth daily.     aspirin EC 81 MG tablet Take 81 mg by mouth daily. Swallow whole.     bisoprolol-hydrochlorothiazide (ZIAC) 5-6.25 MG tablet TAKE 1 TABLET BY MOUTH EVERY DAY FOR BLOOD PRESSURE 90 tablet 3   Cholecalciferol (VITAMIN D-3) 125 MCG (5000 UT) TABS Take 5,000 Units by mouth every evening.      divalproex (DEPAKOTE) 250 MG DR tablet TAKE 2 TABLETS (500 MG TOTAL) BY MOUTH 2 (TWO) TIMES DAILY. 360 tablet 3   tamsulosin (FLOMAX) 0.4 MG CAPS capsule Take  1 capsule  every night for Prostate 90 capsule 3   No current facility-administered medications for this visit.    Medication Side Effects: None  Allergies:  Allergies  Allergen Reactions   Ciprofloxacin Diarrhea    Past Medical History:  Diagnosis Date   Arthritis    Bipolar disorder ([place])    controlled by meds since  1997   Pneumonia    Sleep apnea    cpap   Wears glasses    reading    Family History  Adopted: Yes    Social History   Socioeconomic History   Marital status: Married    Spouse name: Not on file   Number of children: Not on file   Years of education: Not on file   Highest education level: Not on file  Occupational History   Not on file  Tobacco Use   Smoking status: Never   Smokeless tobacco: Never  Vaping Use   Vaping  Use: Never used  Substance and Sexual Activity   Alcohol use: Yes    Comment: 5 days a week   Drug use: No   Sexual activity: Yes  Other Topics Concern   Not on file  Social History Narrative   Right handed   Social Determinants of Health   Financial Resource Strain: Not on file  Food Insecurity: Not on file  Transportation Needs: Not on file  Physical Activity: Not on file  Stress: Not on file  Social Connections: Not on file  Intimate Partner Violence: Not on file    Past Medical History, Surgical history, Social history, and Family history were reviewed and updated as appropriate.   Please see review of systems for further details on the patient's review from today.   Objective:   Physical Exam:  There were no vitals taken for this visit.  Physical Exam Constitutional:      General: He is not in acute distress.    Appearance: He is well-developed.  Musculoskeletal:        General: No deformity.  Neurological:     Mental Status: He is alert and oriented to person, place, and time.     Coordination: Coordination normal.  Psychiatric:        Attention and Perception: Attention normal. He is attentive. He does not perceive auditory hallucinations.        Mood and Affect: Mood normal. Mood is not anxious or depressed. Affect is not labile, blunt, angry, tearful or inappropriate.        Speech: Speech normal. Speech is not rapid and pressured.        Behavior: Behavior normal.        Thought Content: Thought content normal. Thought content is not delusional. Thought content does not include homicidal or suicidal ideation. Thought content does not include suicidal plan.        Cognition and Memory: Cognition normal.        Judgment: Judgment normal.     Comments: Insight is good.    Lab Review:     Component Value Date/Time   NA 139 07/27/2021 1502   K 4.2 07/27/2021 1502   CL 103 07/27/2021 1502   CO2 28 07/27/2021 1502   GLUCOSE 90 07/27/2021 1502   BUN 24  07/27/2021 1502   CREATININE 0.88 07/27/2021 1502   CALCIUM 9.1 07/27/2021 1502   PROT 6.9 07/27/2021 1502   ALBUMIN 4.1 09/01/2016 1519   AST 16 07/27/2021 1502   ALT 13 07/27/2021 1502   ALKPHOS 45 09/01/2016 1519   BILITOT 0.4 07/27/2021 1502   GFRNONAA 80 07/20/2020 1539   GFRAA 93 07/20/2020 1539       Component Value Date/Time   WBC 9.5 07/27/2021 1502   RBC 4.86 07/27/2021 1502   HGB 14.7 07/27/2021 1502   HCT 43.6 07/27/2021 1502   PLT 188 07/27/2021 1502  MCV 89.7 07/27/2021 1502   MCH 30.2 07/27/2021 1502   MCHC 33.7 07/27/2021 1502   RDW 12.3 07/27/2021 1502   LYMPHSABS 3,838 07/27/2021 1502   MONOABS 721 07/21/2016 1630   EOSABS 67 07/27/2021 1502   BASOSABS 38 07/27/2021 1502    No results found for: POCLITH, LITHIUM   Lab Results  Component Value Date   VALPROATE 47.1 (L) 07/20/2020     .res Assessment: Plan:    Bipolar I disorder ([place]) -- stable  Mood stable with meds for years with Depakote 500 mg BID  No change indicated.  Good response.  Labs per PCP yearly - good as noted above incl LFT's and CBC and VPA usually low normal and working.  Disc progressive leg weakness and B12.  Agree with supplement  FU 1 year.  Lynder Parents, MD, DFAPA  Please see After Visit Summary for patient specific instructions.  Future Appointments  Date Time Provider [place]  10/26/2021 10:50 AM Pieter Partridge, DO LBN-LBNG None  10/29/2021  2:00 PM Blanchie Serve, PhD CP-CP None  11/16/2021  1:30 PM Ward Givens, NP GNA-GNA None  11/26/2021  4:00 PM Blanchie Serve, PhD CP-CP None  12/09/2021  2:30 PM Magda Bernheim, NP GAAM-GAAIM None  12/22/2021  1:00 PM Blanchie Serve, PhD CP-CP None  03/21/2022  2:30 PM [name], MD GAAM-GAAIM None  08/02/2022  3:00 PM [name], MD GAAM-GAAIM None    No orders of the defined types were placed in this encounter.     -------------------------------

## 2021-10-25 NOTE — Progress Notes (Unsigned)
NEUROLOGY FOLLOW UP OFFICE NOTE  Timothy [name] [id number]  Assessment/Plan:   Gradually worsening right leg numbness   MRI of cervical and thoracic spine on 04/20/2021 showed degenerative changes but no spinal canal stenosis causing myelopathy.  MRI of brain on 04/29/2021 showed incidental remote small infarct in the medial right parietal lobe and within the posterior left frontal white matter but no explanation for his subjective bilateral lower extremity weakness.  Symptoms may be explained by right L5 radiculopathy as nerve root irritation noted on CT myelogram.  Also, sensory symptoms may correlate with a sensory peroneal neuropathy.  However, both NCV-EMGs were normal.  I have no other explanation of his symptoms.    I recommend following up with the spine specialist to see if he can get a repeat epidural injection to evaluate for any alleviation of the sensory deficits.    Total time of 41 minutes spent both reviewing his chart and with face-to-face discussion and examination.    Metta Clines, DO   Subjective:  Timothy [name] is a 71 year old male with HTN, prediabetes, OSA on CPAP, IBS and Bipolar Disorder who follows up with unsteady gait and lower extremity weakness.   UPDATE: MRI of cervical and thoracic spine on 04/20/2021 showed degenerative changes but no spinal canal stenosis causing myelopathy.  MRI of brain on 04/29/2021 showed incidental remote small infarct in the medial right parietal lobe and within the posterior left frontal white matter but no explanation for his subjective bilateral lower extremity weakness.  He went to [facility] where they repeated a NCV-EMG on 09/29/2021 which was negative.  Symptoms are getting worse.  Reports edema in the right leg and numbness in right foot.  The numbness is so severe he cannot feel the pedal of the car. If he has his legs up, he feels a pain down the lateral leg.     HISTORY: He had left total hip replacement in February 2020.  Following  the procedure, he continued to experience left hip pain, causing him to limp.  He slowly improved but after a few months gradually felt weaker again.  He has some localized back pain but denies numbness and pain in the legs.  He is able to stand up from a chair but reports effort.  When he went snorkeling last summer, he had trouble kicking his legs.  Denies change in bowel or bladder function.  Denies perineal numbness.  Upper extremities feel fine.  Denies double vision, neck weakness, dysphagia or dyspnea.  He returned to his orthopedist, who told him that his hip was okay.  He was referred to neurosurgeon who told him that he had degenerative disc disease and recommended physical therapy but that surgery was not indicated.  He continues to have low back pain and left hip pain, which he treats with Aleve.  He feels both legs are weak, left greater than right.  No numbness or radicular pain down the leg.  No bowel or bladder dysfunction.  He received L5-S1 interlaminar injection in December 2021 without relief.  CT lumbar myelogram on 10/02/2020 personally reviewed multilevel degenerative disc disease with left foraminal stenosis at L3-4, bilateral foraminal stenosis at L4-5 (affecting either L4 nerve) and right foraminal stenosis at L5-S1 (potentially affecting L5 nerve).  NCV-EMG of bilateral lower extremities on 12/01/2020 showed mild chronic right L5 radiculopathy but otherwise unremarkable.  PAST MEDICAL HISTORY: Past Medical History:  Diagnosis Date   Arthritis    Bipolar disorder ([place])  controlled by meds since 1997   Pneumonia    Sleep apnea    cpap   Wears glasses    reading    MEDICATIONS: Current Outpatient Medications on File Prior to Visit  Medication Sig Dispense Refill   Ascorbic Acid (VITAMIN C PO) Take 1 tablet by mouth daily.     aspirin EC 81 MG tablet Take 81 mg by mouth daily. Swallow whole.     bisoprolol-hydrochlorothiazide (ZIAC) 5-6.25 MG tablet TAKE 1 TABLET BY MOUTH  EVERY DAY FOR BLOOD PRESSURE 90 tablet 3   Cholecalciferol (VITAMIN D-3) 125 MCG (5000 UT) TABS Take 5,000 Units by mouth every evening.      divalproex (DEPAKOTE) 250 MG DR tablet Take 2 tablets (500 mg total) by mouth 2 (two) times daily. 360 tablet 3   tamsulosin (FLOMAX) 0.4 MG CAPS capsule Take  1 capsule  every night for Prostate 90 capsule 3   No current facility-administered medications on file prior to visit.    ALLERGIES: Allergies  Allergen Reactions   Ciprofloxacin Diarrhea    FAMILY HISTORY: Family History  Adopted: Yes      Objective:   General: No acute distress.  Patient appears well-groomed.   Head:  Normocephalic/atraumatic Eyes:  Fundi examined but not visualized Neck: supple, no paraspinal tenderness, full range of motion Heart:  Regular rate and rhythm Lungs:  Clear to auscultation bilaterally Back: No paraspinal tenderness Neurological Exam: alert and oriented to person, place, and time.  Speech fluent and not dysarthric, language intact.  CN II-XII intact. Bulk and tone normal, muscle strength 5/5 throughout.  Intention tremor bilaterally.  Sensation pinprick reduced over dorsum and bottom of right foot up the shin and lateral leg to below the knee.  Vibratory sensation intact  Deep tendon reflexes 2+ throughout, toes downgoing.  Finger to nose testing intact.  Gait with right limp.  Romberg negative.   Metta Clines, DO  CC: [name], MD

## 2021-10-26 ENCOUNTER — Encounter: Payer: Self-pay | Admitting: Neurology

## 2021-10-26 ENCOUNTER — Ambulatory Visit (INDEPENDENT_AMBULATORY_CARE_PROVIDER_SITE_OTHER): Payer: PPO | Admitting: Neurology

## 2021-10-26 DIAGNOSIS — M5417 Radiculopathy, lumbosacral region: Secondary | ICD-10-CM | POA: Diagnosis not present

## 2021-10-29 ENCOUNTER — Ambulatory Visit (INDEPENDENT_AMBULATORY_CARE_PROVIDER_SITE_OTHER): Payer: PPO | Admitting: Psychiatry

## 2021-10-29 DIAGNOSIS — F319 Bipolar disorder, unspecified: Secondary | ICD-10-CM

## 2021-10-29 DIAGNOSIS — Z630 Problems in relationship with spouse or partner: Secondary | ICD-10-CM

## 2021-10-29 NOTE — Progress Notes (Signed)
Psychotherapy Progress Note [facility] Timothy Moore, PhD LP  Patient ID: Timothy [name] [facility])    MRN: [id number] Therapy format: Individual psychotherapy Date: 10/29/2021      Start: 2:10p     Stop: 2:55p     Time Spent: 45 min Location: In-person   Session narrative (presenting needs, interim history, self-report of stressors and symptoms, applications of prior therapy, status changes, and interventions made in session) Annual med check last week for very stable BAD.  Per Timothy [name] note continue meds, agree with B supplement.  Brings an extensive email from wife today, listing observations and concerns, much of which returns to labelling his character as narcissistic and casting aspersions on his respect, honesty, and freedom from prurient interest in other women.  While it still seems overdone on her part, and Timothy [name] does agree that she has been unfair to him in times past, bordering on insanely jealous and threatened, including unseemly pumping his brother for information on first meeting the family, he still admits having been guilty of secret-keeping with her, particularly about contact with ex-girlfriend early in their relationship and then allowing texts to continue later in secret.  Refocused on drafting points of valid apology and addressing his own habit of mounting defenses, technical and otherwise, instead of admitting what should be and empathizing with her.  Framed perspective toward empathy and modeled leading with expressing it.  Also noted his habit of overdone mea culpas that only succeed in making a suspicious mind feel manipulated.  Emphasized ways of saying "I get it if ..." [his behavior is reminiscent of her job-former obsessed husband, or her cheating father] and not saying "but" to anything.  Emphasized humility, in that he honestly does not know what it feels like for her [e.g., what it's like to watch a parent do what her dad did] and to try to find  comparisons in his own history [do know what it's like to be cheated on and betrayed, w/ Timothy [name]], while putting emotion words on what he figures she has felt (lonely, demeaned, threatened, etc.).  Reiterated 5-point apology frame an encouraged him to start brainstorming what he can honesty say in apology for secret-keeping.  Therapeutic modalities: Cognitive Behavioral Therapy, Solution-Oriented/Positive Psychology, and Assertiveness/Communication  Mental Status/Observations:  Appearance:   Casual     Behavior:  Appropriate  Motor:  Normal  Speech/Language:   Clear and Coherent  Affect:  Appropriate  Mood:  anxious  Thought process:  normal  Thought content:    WNL  Sensory/Perceptual disturbances:    WNL  Orientation:  Fully oriented  Attention:  Good    Concentration:  Good  Memory:  WNL  Insight:    Fair  Judgment:   Good  Impulse Control:  Good   Risk Assessment: Danger to Self: No Self-injurious Behavior: No Danger to Others: No Physical Aggression / Violence: No Duty to Warn: No Access to Firearms a concern: No  Assessment of progress:  progressing  Diagnosis:   ICD-10-CM   1. Bipolar I disorder ([place]) -- stable  F31.9     2. Relationship problem between partners  Z63.0      Plan:  Practice empathic listening -- sentence frames for validation, humility knowing how she feels, and taking best guesses at naming her meaning and feelings Work on content for 5-point apology, including what he would do differently with the  Practice noticing and removing the word "but", and finding where she is legitimately right Other recommendations/advice  as may be noted above Continue to utilize previously learned skills ad lib Maintain medication as prescribed and work faithfully with relevant prescriber(s) if any changes are desired or seem indicated Call the clinic on-call service, 988/hotline, 911, or present to [facility] or ER if any life-threatening psychiatric crisis Return for  session(s) already scheduled. Already scheduled visit in this office 11/26/2021.  Timothy [name] Serve, PhD Timothy Moore, PhD LP Clinical Psychologist, [facility] [address] [place], [place] [address] [phone]

## 2021-11-15 NOTE — Progress Notes (Unsigned)
PATIENT: Timothy [name] DOB: 03-Dec-1950  REASON FOR VISIT: follow up HISTORY FROM: patient PRIMARY NEUROLOGIST: Dr. Vickey Huger  Chief Complaint  Patient presents with   Obstructive Sleep Apnea    Rm 17. Alone. Pt doing well on CPAP.     HISTORY OF PRESENT ILLNESS: Today 11/16/21:  Timothy [name] is a 71 year old male with a history of OSA on CPAP. He returns today for follow-up.  Reports that the CPAP is working well.  He returns today for follow-up.    REVIEW OF SYSTEMS: Out of a complete 14 system review of symptoms, the patient complains only of the following symptoms, and all other reviewed systems are negative.  See HPI   ALLERGIES: Allergies  Allergen Reactions   Ciprofloxacin Diarrhea    HOME MEDICATIONS: Outpatient Medications Prior to Visit  Medication Sig Dispense Refill   Ascorbic Acid (VITAMIN C PO) Take 1 tablet by mouth daily.     aspirin EC 81 MG tablet Take 81 mg by mouth daily. Swallow whole.     bisoprolol-hydrochlorothiazide (ZIAC) 5-6.25 MG tablet TAKE 1 TABLET BY MOUTH EVERY DAY FOR BLOOD PRESSURE 90 tablet 3   Cholecalciferol (VITAMIN D-3) 125 MCG (5000 UT) TABS Take 5,000 Units by mouth every evening.      divalproex (DEPAKOTE) 250 MG DR tablet Take 2 tablets (500 mg total) by mouth 2 (two) times daily. 360 tablet 3   tamsulosin (FLOMAX) 0.4 MG CAPS capsule Take  1 capsule  every night for Prostate 90 capsule 3   No facility-administered medications prior to visit.    PAST MEDICAL HISTORY: Past Medical History:  Diagnosis Date   Arthritis    Bipolar disorder ([facility])    controlled by meds since 1997   Pneumonia    Sleep apnea    cpap   Wears glasses    reading    PAST SURGICAL HISTORY: Past Surgical History:  Procedure Laterality Date   APPENDECTOMY     COLONOSCOPY     KNEE ARTHROSCOPY W/ MENISCAL REPAIR  2004   left   ORIF HUMERUS FRACTURE Left 09/19/2013   Procedure: LEFT OPEN REDUCTION INTERNAL FIXATION (ORIF) PROXIMAL HUMERUS;  ROTATOR CUFF REPAIR;  Surgeon: Loreta Ave, MD;  Location: [facility];  Service: Orthopedics;  Laterality: Left;   TONSILLECTOMY     TOTAL HIP ARTHROPLASTY Left 07/18/2018   Procedure: TOTAL HIP ARTHROPLASTY ANTERIOR APPROACH;  Surgeon: Samson Frederic, MD;  Location: [facility];  Service: Orthopedics;  Laterality: Left;    FAMILY HISTORY: Family History  Adopted: Yes    SOCIAL HISTORY: Social History   Socioeconomic History   Marital status: Married    Spouse name: Not on file   Number of children: Not on file   Years of education: Not on file   Highest education level: Not on file  Occupational History   Not on file  Tobacco Use   Smoking status: Never   Smokeless tobacco: Never  Vaping Use   Vaping Use: Never used  Substance and Sexual Activity   Alcohol use: Yes    Comment: 5 days a week   Drug use: No   Sexual activity: Yes  Other Topics Concern   Not on file  Social History Narrative   Right handed   Social Determinants of Health   Financial Resource Strain: Not on file  Food Insecurity: Not on file  Transportation Needs: Not on file  Physical Activity: Not on file  Stress: Not on file  Social Connections: Not on file  Intimate Partner Violence: Not on file      PHYSICAL EXAM  Vitals:   11/16/21 1334  BP: (!) 146/82  Pulse: 67  Weight: 188 lb 12.8 oz (85.6 kg)  Height: 5\' 9"  (1.753 m)   Body mass index is 27.88 kg/m.  Generalized: Well developed, in no acute distress  Chest: Lungs clear to auscultation bilaterally  Neurological examination  Mentation: Alert oriented to time, place, history taking. Follows all commands speech and language fluent Cranial nerve II-XII: Extraocular movements were full, visual field were full on confrontational test Head turning and shoulder shrug  were normal and symmetric. Gait and station: Patient's gait is slightly unstable.  Circumduction gait on the right   DIAGNOSTIC DATA (LABS, IMAGING,  TESTING) - I reviewed patient records, labs, notes, testing and imaging myself where available.  Lab Results  Component Value Date   WBC 9.5 07/27/2021   HGB 14.7 07/27/2021   HCT 43.6 07/27/2021   MCV 89.7 07/27/2021   PLT 188 07/27/2021      Component Value Date/Time   NA 139 07/27/2021 1502   K 4.2 07/27/2021 1502   CL 103 07/27/2021 1502   CO2 28 07/27/2021 1502   GLUCOSE 90 07/27/2021 1502   BUN 24 07/27/2021 1502   CREATININE 0.88 07/27/2021 1502   CALCIUM 9.1 07/27/2021 1502   PROT 6.9 07/27/2021 1502   ALBUMIN 4.1 09/01/2016 1519   AST 16 07/27/2021 1502   ALT 13 07/27/2021 1502   ALKPHOS 45 09/01/2016 1519   BILITOT 0.4 07/27/2021 1502   GFRNONAA 80 07/20/2020 1539   GFRAA 93 07/20/2020 1539   Lab Results  Component Value Date   CHOL 152 07/27/2021   HDL 53 07/27/2021   LDLCALC 79 07/27/2021   TRIG 122 07/27/2021   CHOLHDL 2.9 07/27/2021   Lab Results  Component Value Date   HGBA1C 5.2 07/27/2021   Lab Results  Component Value Date   VITAMINB12 429 07/27/2021   Lab Results  Component Value Date   TSH 0.78 07/27/2021      ASSESSMENT AND PLAN 71 y.o. year old male  has a past medical history of Arthritis, Bipolar disorder (HCC), Pneumonia, Sleep apnea, and Wears glasses. here with:  OSA on CPAP  - CPAP compliance excellent - Good treatment of AHI  - Encourage patient to use CPAP nightly and > 4 hours each night - F/U in 1 year or sooner if needed   Butch Penny, MSN, NP-C 11/16/2021, 1:31 PM [facility] [address], [address] [place], [place] [address] [phone]

## 2021-11-16 ENCOUNTER — Ambulatory Visit (INDEPENDENT_AMBULATORY_CARE_PROVIDER_SITE_OTHER): Payer: PPO | Admitting: Adult Health

## 2021-11-16 ENCOUNTER — Encounter: Payer: Self-pay | Admitting: Adult Health

## 2021-11-16 VITALS — BP 146/82 | HR 67 | Ht 69.0 in | Wt 188.8 lb

## 2021-11-16 DIAGNOSIS — G4733 Obstructive sleep apnea (adult) (pediatric): Secondary | ICD-10-CM | POA: Diagnosis not present

## 2021-11-16 DIAGNOSIS — Z9989 Dependence on other enabling machines and devices: Secondary | ICD-10-CM | POA: Diagnosis not present

## 2021-11-16 NOTE — Patient Instructions (Signed)
Continue using CPAP nightly and greater than 4 hours each night °If your symptoms worsen or you develop new symptoms please let us know.  ° °

## 2021-11-26 ENCOUNTER — Ambulatory Visit (INDEPENDENT_AMBULATORY_CARE_PROVIDER_SITE_OTHER): Payer: PPO | Admitting: Psychiatry

## 2021-11-26 DIAGNOSIS — F319 Bipolar disorder, unspecified: Secondary | ICD-10-CM

## 2021-11-26 DIAGNOSIS — Z630 Problems in relationship with spouse or partner: Secondary | ICD-10-CM

## 2021-11-26 NOTE — Progress Notes (Signed)
Psychotherapy Progress Note [facility] Luan Moore, PhD LP  Patient ID: Timothy [name] [facility])    MRN: [id number] Therapy format: Individual psychotherapy Date: 11/26/2021      Start: 4:11p     Stop: 4:56p     Time Spent: 45 min Location: In-person   Session narrative (presenting needs, interim history, self-report of stressors and symptoms, applications of prior therapy, status changes, and interventions made in session) Brings another missive to therapy from [name], an email cueing up what she wants him to talk about, including whether I could see her, ostensibly separately.  Reviewed ethics and legalities, advise she is certainly welcome if the focus is on restorative relationship work, in which case we should see it through to completion and agree on ground rules against "ex parte" conversations.  Addressed Sharon's abiding interest in [place] with narcissism, and renewed the position that deciding labels can only slow down conflict resolution and healing past hurts, and that both Sharon's interest in the label and Mike's interest in proving that [name] overinterpreted her counselor are distractions from the real work and what is important to both of their goals.  Reinforced understanding that couples therapy will not be treated as or allowed to become litigation on either of their parts, and that the focus will be on positive communication and recognizing common enemies in communication patterns they bring to it and overcoming them together.  In particular, emphasis applied to emotional turn-taking, speaking clearly, and active, empathic listening both ways, with some emphasis still on Ronalee Belts allowing turn-taking and practicing empathic listening.  Personally, renewed challenge to practice empathy-forward responses to Sharon's distress, particularly (1) acknowledging what he actually did do and (2) his best understanding what effect it had on her and her feelings  without (3) faultfinding, justifying, or defending, and to just validate what is validate-able.  Coached in taking moments she wants to bring something back up -- even if it is very distressed and in the middle of the night, as happened last weekend around their anniversary -- with some version of yes, I know, and I know it still hurts.  C/o being put through that in the middle of the night, examined whether it's worth objecting to the time, offered it's OK to honestly say he would rather go over it in the daytime, but good will to allow it if she is intent, and go back to validating.  Noted it may be important to ask her to let him try out saying what he "gets" that she wants him to get, and asking if it's close, and he should be able to trust that if she is not severely personality disordered, his honestly empathizing and active listening should help soothe.  After that it comes down to what and how she summons the courage to trust again.  Resolved OK for [name] to join next session (July 26, 1pm).  Therapeutic modalities: Cognitive Behavioral Therapy, Solution-Oriented/Positive Psychology, and Assertiveness/Communication  Mental Status/Observations:  Appearance:   Casual     Behavior:  Appropriate  Motor:  Foot drag , stable  Speech/Language:   Clear and Coherent  Affect:  Appropriate  Mood:  Somewhat anxious  Thought process:  normal  Thought content:    WNL  Sensory/Perceptual disturbances:    WNL  Orientation:  Fully oriented  Attention:  Good    Concentration:  Good  Memory:  WNL  Insight:    Good  Judgment:   Good  Impulse Control:  Good  Risk Assessment: Danger to Self: No Self-injurious Behavior: No Danger to Others: No Physical Aggression / Violence: No Duty to Warn: No Access to Firearms a concern: No  Assessment of progress:  progressing  Diagnosis:   ICD-10-CM   1. Bipolar I disorder ([place]) -- stable  F31.9     2. Relationship problem between partners  Z63.0       Plan:  Agreed for wife [name] to join next session, understanding that it converts to couples focused therapy, marital therapist confidentiality law and ethical standards apply, and we will make clear agreement that we do not have ex parte conversations concerning the other once couples therapy is engaged and agreed. Individual emphasis on empathy-forward responses to Sharon's distress, including any attempts to rehash her hurt and his harmful behavior.  Trust that all defenses, nuances, objections, and clarifications can wait until he has credibly tried out recognizing her feelings and she has made a recognizable attempt to tell him whether he is accurate. Continue any attempt to refrain from defensiveness, being willing to acknowledge discomfort or wanting not to what is happening at the moment, but not resorting to counter charges. If possible, go ahead and agree together that grievances they both have about the other one speaking out of school to friends or family during an earlier separation can be bygones that neither one needs to prosecute any further. Other recommendations/advice as may be noted above Continue to utilize previously learned skills ad lib Maintain medication as prescribed and work faithfully with relevant prescriber(s) if any changes are desired or seem indicated Call the clinic on-call service, 988/hotline, 911, or present to [facility] or ER if any life-threatening psychiatric crisis Return for session(s) already scheduled, recommend scheduling ahead. Already scheduled visit in this office 12/22/2021.  Blanchie Serve, PhD Luan Moore, PhD LP Clinical Psychologist, [facility] [address], [place], [place] [address] [phone]

## 2021-12-07 ENCOUNTER — Ambulatory Visit: Payer: PPO | Admitting: Adult Health

## 2021-12-07 NOTE — Progress Notes (Unsigned)
MEDICARE ANNUAL WELLNESS VISIT AND FOLLOW UP Assessment:   Diagnoses and all orders for this visit:  Encounter for Medicare annual wellness exam Due annually  Health maintenance reviewed  Essential hypertension Continue medication Monitor blood pressure at home; call if consistently over 130/80 Continue DASH diet.   Reminder to go to the ER if any CP, SOB, nausea, dizziness, severe HA, changes vision/speech, left arm numbness and tingling and jaw pain.  Vitamin D deficiency Continue supplementation; goal is 60-100;  Defer vitamin D level  OSA on CPAP Reports 100% compliance; no concerns; follows with Dr. [name]  Other abnormal glucose Recent A1Cs at goal Discussed diet/exercise, weight management  Defer A1C;   Medication management CBC, CMP/GFR q87m Bipolar type 1 depression ([place] Continue follow up with Dr. CClovis PuCheck valproic acid levels PRN only; has been stable for many years  History of adenomatous polyp of colon Due follow up, has scheduled 02/2021 per patient   Degenerative scoliosis/5R L5 radiculopathy Has had extensive workup per below, patient noting new R>L weakness that does correspond with CT myelogram and EMG/NCV results though note not the same concern he was having when he saw neurosurgery He reports was advised repeat PT by neuro -  In light of new R>L sx per above may be worth trying steroid taper - discussed with patient and Dr. MThelma Compin agreement, sent in  Patient to continue back exercises as recommended by PT If no improvement would recommend follow up in neurosurgery due to new sx changed since last OV consistent with structural issue  BMI 26, adult Continue to recommend diet heavy in fruits and veggies and low in animal meats, cheeses, and dairy products, appropriate calorie intake Discuss exercise recommendations routinely Continue to monitor weight at each visit  Defer labs today; last were normal without recent changes  Over 30  minutes of exam, counseling, chart review, and critical decision making was performed  Future Appointments  Date Time Provider DRandolph 12/09/2021  2:30 PM WAlycia Rossetti NP [facility] None  12/22/2021  1:00 PM MBlanchie Serve PhD CP-CP None  03/21/2022  2:30 PM MUnk Pinto MD [facility] None  08/02/2022  3:00 PM MUnk Pinto MD [facility] None  10/20/2022  2:00 PM Cottle, CBilley Co, MD CP-CP None  11/17/2022  2:00 PM MWard Givens NP GNA-[facility] None     Plan:   During the course of the visit the patient was educated and counseled about appropriate screening and preventive services including:   Pneumococcal vaccine  Influenza vaccine Prevnar 13 Td vaccine Screening electrocardiogram Colorectal cancer screening Diabetes screening Glaucoma screening Nutrition counseling    Subjective:  Timothy TANGONANis a 71y.o. male who presents for Medicare Annual Wellness Visit and 6 month follow up for HTN, hyperlipidemia, glucose management, and vitamin D Def.  Today he is frustrated about persistent extremity weakness.   He had L hip replaced by Dr. SLyla Glassingin 2020, reports was recovering steadily but reports winter he noted progressive weakness of hip and thigh on left, was evaluated with normal hip reported. He was evaluated by Dr. RMayer Camelwho felt likely lumbar etiology. He was referred to neurosurgery, ? Dr. JNewman Pieswho advised degenerative disc disease but without indication for surgery at that time and was recommended PT. Patient reported no improvement with this. He continued to perceive weakness of bilateral legs, note L>R at that time. He underwent L5-S1 interlaminar injection in 04/2020 without relief. He was evaluated by neurology Dr. 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 The patient notes in the last 4-6 weeks has noted less L sided weakness, has had R "foot drop" - difficulty lifting foot, shuffling and stumbling, notes lumbar pain is rare and intermittent. He reports called back this AM for recommendations and was advised further PT, very frustrated as has completed 2 courses without benefit thus far. Has continued with home exercises for back.   Patient  followed by Dr Clovis Pu for Bipolar Depression & is on Depakote. Levels were checked in Feb 2022. Patient feels he is doing well.   He has OSA on CPAP since 11/2017; no issues, endorses 100% compliance and endorses restorative sleep. Followed by Dr. Beacher May annually.   BMI is There is no height or weight on file to calculate BMI., he has been working on diet and exercise, does free weights and back exercises at home, no longer able to take walks or hike due to extremity weakness working up by neuro.  Wt Readings from Last 3 Encounters:  11/16/21 188 lb 12.8 oz (85.6 kg)  07/27/21 188 lb (85.3 kg)  03/29/21 187 lb (84.8 kg)   He does not check BP at home, today their BP is   He does workout. He denies chest pain, shortness of breath, dizziness.   He is not on cholesterol medication and denies myalgias. His cholesterol is at goal and has been for several years. The cholesterol last visit was:   Lab Results  Component Value Date   CHOL 152 07/27/2021   HDL 53 07/27/2021   LDLCALC 79 07/27/2021   TRIG 122 07/27/2021   CHOLHDL 2.9 07/27/2021   He has been working on diet and exercise for glucose management, and denies foot ulcerations, increased appetite, nausea, paresthesia of the feet, polydipsia, polyuria, visual disturbances, vomiting and weight loss. Last A1C in the office was:  Lab Results  Component Value Date   HGBA1C 5.2 07/27/2021   Last GFR Lab Results  Component Value Date   GFRNONAA 80 07/20/2020    Patient is on Vitamin D supplement, no recent dose change  Lab Results   Component Value Date   VD25OH 97 07/27/2021      Medication Review:   Current Outpatient Medications (Cardiovascular):    bisoprolol-hydrochlorothiazide (ZIAC) 5-6.25 MG tablet, TAKE 1 TABLET BY MOUTH EVERY DAY FOR BLOOD PRESSURE   Current Outpatient Medications (Analgesics):    aspirin EC 81 MG tablet, Take 81 mg by mouth daily. Swallow whole.   Current Outpatient Medications (Other):    Ascorbic Acid (VITAMIN C PO), Take 1 tablet by mouth daily.   Cholecalciferol (VITAMIN D-3) 125 MCG (5000 UT) TABS, Take 5,000 Units by mouth every evening.    divalproex (DEPAKOTE) 250 MG DR tablet, Take 2 tablets (500 mg total) by mouth 2 (two) times daily.   tamsulosin (FLOMAX) 0.4 MG CAPS capsule, Take  1 capsule  every night for Prostate (Patient not taking: Reported on 11/16/2021)  Allergies: Allergies  Allergen Reactions   Ciprofloxacin Diarrhea    Current Problems (verified) has HTN (hypertension); Vitamin D deficiency; Other abnormal glucose; Diarrhea; Medication management; Depression; History of adenomatous polyp of colon; S/P total hip arthroplasty; OSA on CPAP; Bipolar I disorder ([place]); and Degenerative scoliosis in adult patient on their problem list.  Screening Tests Immunization History  Administered Date(s) Administered  Moderna Sars-Covid-2 Vaccination 07/02/2019, 07/30/2019   PFIZER(Purple Top)SARS-COV-2 Vaccination 03/13/2020   Pneumococcal Conjugate-13 04/05/2017   Pneumococcal Polysaccharide-23 11/22/2018   Td 11/06/2017    Preventative care: Last colonoscopy: May/2017 - benign polyp - Dr. Amedeo Plenty -5 year follow up, patient has called to schedule, soonest appointment in Oct 2022 is scheduled  Prior vaccinations: TD or Tdap: 2019 Influenza: 04/2018- declines   Pneumococcal: 10/2018 Prevnar13: 2018 Shingles/Zostavax: had at previous practice Covid 19: 2/2, 2021, moderna + 02/2020  Names of Other Physician/Practitioners you currently use: 1. [facility] here for primary care 2. Dr. [place], eye doctor, last visit 2020, no issues, readers  3. [place], dentist, last visit 2021, q4m4. Dermatology specialists, annually, last in 09/2019, has scheduled 11/2020  Patient Care Team: MUnk Pinto MD as PCP - General (Internal Medicine) Cottle, CBilley Co, MD as Attending Physician (Psychiatry) JPieter Partridge DO as Consulting Physician (Neurology)  Surgical: He  has a past surgical history that includes Tonsillectomy; Appendectomy; Colonoscopy; Knee arthroscopy w/ meniscal repair (2004); ORIF humerus fracture (Left, 09/19/2013); and Total hip arthroplasty (Left, 07/18/2018). Family His family history is not on file. He was adopted. Social history  He reports that he has never smoked. He has never used smokeless tobacco. He reports current alcohol use. He reports that he does not use drugs.  MEDICARE WELLNESS OBJECTIVES: Physical activity:   Cardiac risk factors:   Depression/mood screen:      12/07/2020    3:43 PM  Depression screen PHQ 2/9  Decreased Interest 0  Down, Depressed, Hopeless 0  PHQ - 2 Score 0    ADLs:      No data to display           Cognitive Testing  Alert? Yes  Normal Appearance?Yes  Oriented to person? Yes  Place? Yes   Time? Yes  Recall of three objects?  Yes  Can perform simple calculations? Yes  Displays appropriate judgment?Yes  Can read the correct time from a watch face?Yes  EOL planning:     Objective:   There were no vitals filed for this visit.   There is no height or weight on file to calculate BMI.  General appearance: alert, no distress, WD/WN, male HEENT: normocephalic, sclerae anicteric, TMs pearly, nares patent, no discharge or erythema, pharynx normal Oral cavity: MMM, no lesions Neck: supple, no lymphadenopathy, no thyromegaly, no masses Heart: RRR, normal S1, S2, no murmurs Lungs: CTA bilaterally, no wheezes, rhonchi, or rales Abdomen: +bs,  soft, non tender, non distended, no masses, no hepatomegaly, no splenomegaly Musculoskeletal: nontender, no swelling, no obvious deformity Extremities: no edema, no cyanosis, no clubbing Pulses: 2+ symmetric, upper and lower extremities, normal cap refill Neurological: alert, oriented x 3, CN2-12 intact, strength normal upper extremities, lower extremities R 4/5 with knee extension and flexion, Left 5/5, ankle flexion and extension is 5/5 and symmetrical. Sensation normal throughout, DTRs 2+ throughout, no cerebellar signs, gait mildly wide based and shuffling. Psychiatric: normal affect, behavior normal, pleasant   Medicare Attestation I have personally reviewed: The patient's medical and social history Their use of alcohol, tobacco or illicit drugs Their current medications and supplements The patient's functional ability including ADLs,fall risks, home safety risks, cognitive, and hearing and visual impairment Diet and physical activities Evidence for depression or mood disorders  The patient's weight, height, BMI, and visual acuity have been recorded in the chart.  I have made referrals, counseling, and provided education to the patient  based on review of the above and I have provided the patient with a written personalized care plan for preventive services.     Alycia Rossetti, NP   12/07/2021

## 2021-12-09 ENCOUNTER — Ambulatory Visit (INDEPENDENT_AMBULATORY_CARE_PROVIDER_SITE_OTHER): Payer: PPO | Admitting: Nurse Practitioner

## 2021-12-09 ENCOUNTER — Encounter: Payer: Self-pay | Admitting: Nurse Practitioner

## 2021-12-09 VITALS — BP 140/80 | HR 72 | Temp 97.9°F | Ht 69.0 in | Wt 187.4 lb

## 2021-12-09 DIAGNOSIS — Z6826 Body mass index (BMI) 26.0-26.9, adult: Secondary | ICD-10-CM

## 2021-12-09 DIAGNOSIS — F319 Bipolar disorder, unspecified: Secondary | ICD-10-CM | POA: Diagnosis not present

## 2021-12-09 DIAGNOSIS — Z79899 Other long term (current) drug therapy: Secondary | ICD-10-CM | POA: Diagnosis not present

## 2021-12-09 DIAGNOSIS — R6889 Other general symptoms and signs: Secondary | ICD-10-CM

## 2021-12-09 DIAGNOSIS — M4150 Other secondary scoliosis, site unspecified: Secondary | ICD-10-CM

## 2021-12-09 DIAGNOSIS — I10 Essential (primary) hypertension: Secondary | ICD-10-CM

## 2021-12-09 DIAGNOSIS — R7309 Other abnormal glucose: Secondary | ICD-10-CM | POA: Diagnosis not present

## 2021-12-09 DIAGNOSIS — Z86010 Personal history of colonic polyps: Secondary | ICD-10-CM

## 2021-12-09 DIAGNOSIS — Z6827 Body mass index (BMI) 27.0-27.9, adult: Secondary | ICD-10-CM

## 2021-12-09 DIAGNOSIS — Z860101 Personal history of adenomatous and serrated colon polyps: Secondary | ICD-10-CM

## 2021-12-09 DIAGNOSIS — Z0001 Encounter for general adult medical examination with abnormal findings: Secondary | ICD-10-CM | POA: Diagnosis not present

## 2021-12-09 DIAGNOSIS — E559 Vitamin D deficiency, unspecified: Secondary | ICD-10-CM | POA: Diagnosis not present

## 2021-12-09 DIAGNOSIS — Z0000 Encounter for general adult medical examination without abnormal findings: Secondary | ICD-10-CM

## 2021-12-09 DIAGNOSIS — G4733 Obstructive sleep apnea (adult) (pediatric): Secondary | ICD-10-CM

## 2021-12-10 LAB — TSH: TSH: 0.53 mIU/L (ref 0.40–4.50)

## 2021-12-10 LAB — VALPROIC ACID LEVEL: Valproic Acid Lvl: 48.3 mg/L — ABNORMAL LOW (ref 50.0–100.0)

## 2021-12-10 LAB — COMPLETE METABOLIC PANEL WITH GFR
AG Ratio: 1.5 (calc) (ref 1.0–2.5)
ALT: 12 U/L (ref 9–46)
AST: 16 U/L (ref 10–35)
Albumin: 4.3 g/dL (ref 3.6–5.1)
Alkaline phosphatase (APISO): 49 U/L (ref 35–144)
BUN/Creatinine Ratio: 26 (calc) — ABNORMAL HIGH (ref 6–22)
BUN: 28 mg/dL — ABNORMAL HIGH (ref 7–25)
CO2: 26 mmol/L (ref 20–32)
Calcium: 9.7 mg/dL (ref 8.6–10.3)
Chloride: 100 mmol/L (ref 98–110)
Creat: 1.09 mg/dL (ref 0.70–1.28)
Globulin: 2.9 g/dL (calc) (ref 1.9–3.7)
Glucose, Bld: 98 mg/dL (ref 65–99)
Potassium: 4.4 mmol/L (ref 3.5–5.3)
Sodium: 135 mmol/L (ref 135–146)
Total Bilirubin: 0.4 mg/dL (ref 0.2–1.2)
Total Protein: 7.2 g/dL (ref 6.1–8.1)
eGFR: 73 mL/min/{1.73_m2} (ref >=60)

## 2021-12-10 LAB — CBC WITH DIFFERENTIAL/PLATELET
Absolute Monocytes: 880 cells/uL (ref 200–950)
Basophils Absolute: 21 cells/uL (ref 0–200)
Basophils Relative: 0.2 %
Eosinophils Absolute: 11 cells/uL — ABNORMAL LOW (ref 15–500)
Eosinophils Relative: 0.1 %
HCT: 47.3 % (ref 38.5–50.0)
Hemoglobin: 15.9 g/dL (ref 13.2–17.1)
Lymphs Abs: 2565 cells/uL (ref 850–3900)
MCH: 30.8 pg (ref 27.0–33.0)
MCHC: 33.6 g/dL (ref 32.0–36.0)
MCV: 91.5 fL (ref 80.0–100.0)
MPV: 11.5 fL (ref 7.5–12.5)
Monocytes Relative: 8.3 %
Neutro Abs: 7123 cells/uL (ref 1500–7800)
Neutrophils Relative %: 67.2 %
Platelets: 203 10*3/uL (ref 140–400)
RBC: 5.17 10*6/uL (ref 4.20–5.80)
RDW: 12.8 % (ref 11.0–15.0)
Total Lymphocyte: 24.2 %
WBC: 10.6 10*3/uL (ref 3.8–10.8)

## 2021-12-22 ENCOUNTER — Ambulatory Visit (INDEPENDENT_AMBULATORY_CARE_PROVIDER_SITE_OTHER): Payer: PPO | Admitting: Psychiatry

## 2021-12-22 DIAGNOSIS — F319 Bipolar disorder, unspecified: Secondary | ICD-10-CM

## 2021-12-22 DIAGNOSIS — Z630 Problems in relationship with spouse or partner: Secondary | ICD-10-CM | POA: Diagnosis not present

## 2021-12-22 NOTE — Progress Notes (Signed)
Psychotherapy Progress Note [facility] Luan Moore, PhD LP  Patient ID: ARAGON SCARANTINO [facility])    MRN: [id number] Therapy format: Individual psychotherapy Date: 12/22/2021      Start: 1:05p     Stop: 2:01p     Time Spent: 56 min Location: In-person   Session narrative (presenting needs, interim history, self-report of stressors and symptoms, applications of prior therapy, status changes, and interventions made in session) 4 wks since last seen.  Brings wife [name], who says he's been telling her she "needs to" come to counseling (?).  Corrected the understanding that he asked about bringing her and it sounded like a good idea given the strife they've had, but it is purely an offer, not an order.  [name] says she is in counseling of her own, been very helpful.  Nayan makes a point of pointing out how attractive she is.    Asked to delve into the state of issues they face, she acknowledges "betrayals" and shock still at his involvement/communication with another woman.  Recounts 2019 his reaching out to ex-GF C.J., and finding on his iPad evidence of contacting her, in contradiction to the story that she had hurt him so bad he wouldn't have anything to do with her.  Most recently, 4 months ago, she found a message from [place] to [place], and his reply to her that looked particularly excited to see her.  Acknowledges being triggered by reminders, like anniversary, but most concerning has been seeing her in person, at her house.  Ronalee Belts, for his part, does not dispute allegations but is prone to state shame and refocus on sincere (?) wish to love her properly and make up for things.  [name] asks for interpretation of Mike's behavior, why he would have such trouble keeping faithful.  With consent offered the most common idea that the man is afraid of both commitment and conflict, and roleplayed and internal conversation with these fears, which seemed to make sense to both.  Surprisingly,  Ronalee Belts reveals a hx of cheating the first time on his first wife, characterizing in perhaps hypermoral terms how it "deadened my conscience".  On probing, refined the idea -- which would sound on he surface more like narcissism or psychopathy -- as vacillating, actually, between repression and shame attacks to the extent where mixed feelings were hard to gauge, and he was more vulnerable to impulses to just feel better in the company of a woman who was attracted to him.  Also interpreted fear of age/death as a possibly motivator, especially given his obviously limiting neurological condition.  [name] reveals her own history learning to fear abandonment, and her tendency in victimhood to become extra wary but latch on.  Interpreted a matched set of issues that certainly can be worked out through therapy and recovery as a couple if both are interested.  Tentative plans made for return to couples therapy, with latitude to mix and match individual and couples sessions depending on availability.  Therapeutic modalities: Cognitive Behavioral Therapy and Solution-Oriented/Positive Psychology  Mental Status/Observations:  Appearance:   Casual     Behavior:  Appropriate  Motor:  Normal  Speech/Language:   Clear and Coherent  Affect:  Appropriate and tense  Mood:  tense  Thought process:  normal  Thought content:    WNL  Sensory/Perceptual disturbances:    WNL  Orientation:  Fully oriented  Attention:  Good    Concentration:  Fair  Memory:  grossly intact  Insight:  Fair  Judgment:   Good  Impulse Control:  Good   Risk Assessment: Danger to Self: No Self-injurious Behavior: No Danger to Others: No Physical Aggression / Violence: No Duty to Warn: No Access to Firearms a concern: No  Assessment of progress:  stabilized  Diagnosis:   ICD-10-CM   1. Bipolar I disorder ([place]) -- stable  F31.9     2. Relationship problem between partners  Z63.0      Plan:  May continue in couples therapy on  continued, informed consent and interest Individual emphasis on empathy-forward responses to Sharon's distress, including any attempts to rehash her hurt and his harmful behavior.  Trust that all defenses, nuances, objections, and clarifications can wait until he has credibly tried out recognizing her feelings and she has made a recognizable attempt to tell him whether he is accurate. Continue any attempt to refrain from defensiveness, being willing to acknowledge discomfort and/or not wanting to go on with an interaction at the moment, not resorting to grievances or blame to get a moment stopped If possible, go ahead and agree together that grievances they both have about the other one speaking out of school to friends or family during an earlier separation can be bygones that neither one needs to prosecute any further. Consider interpretations of ear of commitment and of age/death Other recommendations/advice as may be noted above Continue to utilize previously learned skills ad lib Maintain medication as prescribed and work faithfully with relevant prescriber(s) if any changes are desired or seem indicated Call the clinic on-call service, 988/hotline, 911, or present to [facility] or ER if any life-threatening psychiatric crisis Return 2-6 wks as able, for recommend scheduling ahead. Already scheduled visit in this office Visit date not found.  Blanchie Serve, PhD Luan Moore, PhD LP Clinical Psychologist, [facility] [address], [place], [place] [address] [phone]

## 2021-12-23 DIAGNOSIS — D225 Melanocytic nevi of trunk: Secondary | ICD-10-CM | POA: Diagnosis not present

## 2021-12-23 DIAGNOSIS — C44612 Basal cell carcinoma of skin of right upper limb, including shoulder: Secondary | ICD-10-CM | POA: Diagnosis not present

## 2021-12-23 DIAGNOSIS — L570 Actinic keratosis: Secondary | ICD-10-CM | POA: Diagnosis not present

## 2021-12-23 DIAGNOSIS — D2272 Melanocytic nevi of left lower limb, including hip: Secondary | ICD-10-CM | POA: Diagnosis not present

## 2021-12-23 DIAGNOSIS — L578 Other skin changes due to chronic exposure to nonionizing radiation: Secondary | ICD-10-CM | POA: Diagnosis not present

## 2021-12-23 DIAGNOSIS — D2271 Melanocytic nevi of right lower limb, including hip: Secondary | ICD-10-CM | POA: Diagnosis not present

## 2021-12-23 DIAGNOSIS — Z85820 Personal history of malignant melanoma of skin: Secondary | ICD-10-CM | POA: Diagnosis not present

## 2021-12-23 DIAGNOSIS — L821 Other seborrheic keratosis: Secondary | ICD-10-CM | POA: Diagnosis not present

## 2021-12-23 DIAGNOSIS — D485 Neoplasm of uncertain behavior of skin: Secondary | ICD-10-CM | POA: Diagnosis not present

## 2021-12-23 DIAGNOSIS — Z85828 Personal history of other malignant neoplasm of skin: Secondary | ICD-10-CM | POA: Diagnosis not present

## 2022-01-05 ENCOUNTER — Ambulatory Visit (INDEPENDENT_AMBULATORY_CARE_PROVIDER_SITE_OTHER): Payer: PPO | Admitting: Psychiatry

## 2022-01-05 DIAGNOSIS — Z630 Problems in relationship with spouse or partner: Secondary | ICD-10-CM | POA: Diagnosis not present

## 2022-01-05 DIAGNOSIS — F319 Bipolar disorder, unspecified: Secondary | ICD-10-CM | POA: Diagnosis not present

## 2022-01-05 NOTE — Progress Notes (Signed)
Psychotherapy Progress Note [facility] Luan Moore, PhD LP  Patient ID: Timothy [name] [facility])    MRN: [id number] Therapy format: Individual psychotherapy Date: 01/05/2022      Start: 6:10p     Stop: 7:00p     Time Spent: 50 min Location: In-person   Session narrative (presenting needs, interim history, self-report of stressors and symptoms, applications of prior therapy, status changes, and interventions made in session) First met Timothy [name] last session, identified ways in which Timothy [name] was being unnecessarily defensive, and made use of spontaneous handholding in session to illuminate both how much Timothy [name] continues to care for him and how noncatastrophic it really can be that he has had to face this scrutiny about being in touch with his ex at more than one point in the relationship.  Challenged to   Timothy [name], Clance is here alone, says Timothy [name] has just had it, after discovering he visited a porn site.  Apparently they also have had a pattern of repeated evening fights, and it's so thorough and demoralizing that she has made a decision to split up and he thinks it's for the best, needs to be relieved of her tension, suspicion, blame, etc.  2021 already saw a yearlong breakup that he attributes to Timothy [name]'s reactivity, but freely admits this one is his own fault, mainly for dabbling with his ex, and for indulging porn.  Timothy [name] has now bought a house in [place] near her sister, plans to move there.  On review, early love life with Timothy [name] was sexually active and very satisfying and youthful, but faded as he saw distasteful characteristics like insecurity and jealousy.  Before marriage, he introduced to his brother in [place], who cautioned him how jealous she was and predicted it would become trouble.  Led to a time of her accusing him of leering at other women when he allegedly was not.  In the recent breakdown, she brought up an old accusation that he was interested in a woman they know,  whom he swears is not appealing to him.  On further review, they are presently sleeping in the same bed, with Timothy [name] having offered to bed down in another room but Timothy [name], actually, more or less insisting they remain in a shared bed until the time she packs and leaves, or he is otherwise situated.  Acknowledged what a double message this seems to be and suggested he still can, if he wishes, begin the physical separation they both claim to want and resolve the cognitive dissonance, but he says he is leery of causing the next big fight.  Unable to persuade, allowed to make his own choice.  Forecast the possibility she cools and changes her mind, even though a house is (allegedly) bought, challenged to consider how he wants to respond to that if it happens.  Therapeutic modalities: Cognitive Behavioral Therapy, Solution-Oriented/Positive Psychology, and Ego-Supportive  Mental Status/Observations:  Appearance:   Casual     Behavior:  Appropriate  Motor:  Normal and exc gait affected by stenosis  Speech/Language:   Clear and Coherent  Affect:  Appropriate  Mood:  dysthymic  Thought process:  normal  Thought content:    Shame thoughts  Sensory/Perceptual disturbances:    WNL  Orientation:  Fully oriented  Attention:  Good    Concentration:  Fair  Memory:  WNL  Insight:    Fair  Judgment:   Good  Impulse Control:  Good   Risk Assessment: Danger to Self: No Self-injurious Behavior: No Danger  to Others: No Physical Aggression / Violence: No Duty to Warn: No Access to Firearms a concern: No  Assessment of progress:  situational setback(s)  Diagnosis:   ICD-10-CM   1. Bipolar I disorder ([place]) -- stable  F31.9    r/o emerging hypomania    2. Relationship problem between partners  Z63.0      Plan:  Assess separation and living arrangements, encourage enact separate beds for cognitive dissonance sake Remains open for couples therapy if change of heart Tend health habits Recommend do  not indulge porn with or without prospect of upsetting wife Emphasize granting her freedom to decide, not repeating guilt to her -- groveling will only stoke the next grievance or rebellion and reinforce unrealistic ideas of the relationship, both as it has been and as it could be Other recommendations/advice as may be noted above Continue to utilize previously learned skills ad lib Maintain medication as prescribed and work faithfully with relevant prescriber(s) if any changes are desired or seem indicated Call the clinic on-call service, 988/hotline, 911, or present to [facility] or ER if any life-threatening psychiatric crisis Return for session(s) already scheduled, available earlier @ PT's need. Already scheduled visit in this office 02/17/2022.  Blanchie Serve, PhD Luan Moore, PhD LP Clinical Psychologist, [facility] [address] [place], [place] [address] [phone]

## 2022-01-26 DIAGNOSIS — M545 Low back pain, unspecified: Secondary | ICD-10-CM | POA: Diagnosis not present

## 2022-01-26 DIAGNOSIS — M62838 Other muscle spasm: Secondary | ICD-10-CM | POA: Diagnosis not present

## 2022-01-26 DIAGNOSIS — M546 Pain in thoracic spine: Secondary | ICD-10-CM | POA: Diagnosis not present

## 2022-01-26 DIAGNOSIS — M9902 Segmental and somatic dysfunction of thoracic region: Secondary | ICD-10-CM | POA: Diagnosis not present

## 2022-01-26 DIAGNOSIS — M9901 Segmental and somatic dysfunction of cervical region: Secondary | ICD-10-CM | POA: Diagnosis not present

## 2022-01-26 DIAGNOSIS — M9903 Segmental and somatic dysfunction of lumbar region: Secondary | ICD-10-CM | POA: Diagnosis not present

## 2022-01-26 DIAGNOSIS — M542 Cervicalgia: Secondary | ICD-10-CM | POA: Diagnosis not present

## 2022-01-26 DIAGNOSIS — M62830 Muscle spasm of back: Secondary | ICD-10-CM | POA: Diagnosis not present

## 2022-02-02 DIAGNOSIS — M545 Low back pain, unspecified: Secondary | ICD-10-CM | POA: Diagnosis not present

## 2022-02-02 DIAGNOSIS — M62838 Other muscle spasm: Secondary | ICD-10-CM | POA: Diagnosis not present

## 2022-02-02 DIAGNOSIS — M9902 Segmental and somatic dysfunction of thoracic region: Secondary | ICD-10-CM | POA: Diagnosis not present

## 2022-02-02 DIAGNOSIS — M62830 Muscle spasm of back: Secondary | ICD-10-CM | POA: Diagnosis not present

## 2022-02-02 DIAGNOSIS — M9903 Segmental and somatic dysfunction of lumbar region: Secondary | ICD-10-CM | POA: Diagnosis not present

## 2022-02-02 DIAGNOSIS — M546 Pain in thoracic spine: Secondary | ICD-10-CM | POA: Diagnosis not present

## 2022-02-02 DIAGNOSIS — M542 Cervicalgia: Secondary | ICD-10-CM | POA: Diagnosis not present

## 2022-02-02 DIAGNOSIS — M9901 Segmental and somatic dysfunction of cervical region: Secondary | ICD-10-CM | POA: Diagnosis not present

## 2022-02-03 DIAGNOSIS — M62838 Other muscle spasm: Secondary | ICD-10-CM | POA: Diagnosis not present

## 2022-02-03 DIAGNOSIS — M9902 Segmental and somatic dysfunction of thoracic region: Secondary | ICD-10-CM | POA: Diagnosis not present

## 2022-02-03 DIAGNOSIS — M9903 Segmental and somatic dysfunction of lumbar region: Secondary | ICD-10-CM | POA: Diagnosis not present

## 2022-02-03 DIAGNOSIS — M62830 Muscle spasm of back: Secondary | ICD-10-CM | POA: Diagnosis not present

## 2022-02-03 DIAGNOSIS — M542 Cervicalgia: Secondary | ICD-10-CM | POA: Diagnosis not present

## 2022-02-03 DIAGNOSIS — M546 Pain in thoracic spine: Secondary | ICD-10-CM | POA: Diagnosis not present

## 2022-02-03 DIAGNOSIS — M9901 Segmental and somatic dysfunction of cervical region: Secondary | ICD-10-CM | POA: Diagnosis not present

## 2022-02-03 DIAGNOSIS — M545 Low back pain, unspecified: Secondary | ICD-10-CM | POA: Diagnosis not present

## 2022-02-07 DIAGNOSIS — M542 Cervicalgia: Secondary | ICD-10-CM | POA: Diagnosis not present

## 2022-02-07 DIAGNOSIS — M9901 Segmental and somatic dysfunction of cervical region: Secondary | ICD-10-CM | POA: Diagnosis not present

## 2022-02-07 DIAGNOSIS — M546 Pain in thoracic spine: Secondary | ICD-10-CM | POA: Diagnosis not present

## 2022-02-07 DIAGNOSIS — M62830 Muscle spasm of back: Secondary | ICD-10-CM | POA: Diagnosis not present

## 2022-02-07 DIAGNOSIS — M62838 Other muscle spasm: Secondary | ICD-10-CM | POA: Diagnosis not present

## 2022-02-07 DIAGNOSIS — M9903 Segmental and somatic dysfunction of lumbar region: Secondary | ICD-10-CM | POA: Diagnosis not present

## 2022-02-07 DIAGNOSIS — M545 Low back pain, unspecified: Secondary | ICD-10-CM | POA: Diagnosis not present

## 2022-02-07 DIAGNOSIS — M9902 Segmental and somatic dysfunction of thoracic region: Secondary | ICD-10-CM | POA: Diagnosis not present

## 2022-02-09 DIAGNOSIS — M62838 Other muscle spasm: Secondary | ICD-10-CM | POA: Diagnosis not present

## 2022-02-09 DIAGNOSIS — M545 Low back pain, unspecified: Secondary | ICD-10-CM | POA: Diagnosis not present

## 2022-02-09 DIAGNOSIS — M542 Cervicalgia: Secondary | ICD-10-CM | POA: Diagnosis not present

## 2022-02-09 DIAGNOSIS — M62830 Muscle spasm of back: Secondary | ICD-10-CM | POA: Diagnosis not present

## 2022-02-09 DIAGNOSIS — M9901 Segmental and somatic dysfunction of cervical region: Secondary | ICD-10-CM | POA: Diagnosis not present

## 2022-02-09 DIAGNOSIS — M9902 Segmental and somatic dysfunction of thoracic region: Secondary | ICD-10-CM | POA: Diagnosis not present

## 2022-02-09 DIAGNOSIS — M546 Pain in thoracic spine: Secondary | ICD-10-CM | POA: Diagnosis not present

## 2022-02-09 DIAGNOSIS — M9903 Segmental and somatic dysfunction of lumbar region: Secondary | ICD-10-CM | POA: Diagnosis not present

## 2022-02-10 DIAGNOSIS — M545 Low back pain, unspecified: Secondary | ICD-10-CM | POA: Diagnosis not present

## 2022-02-10 DIAGNOSIS — M542 Cervicalgia: Secondary | ICD-10-CM | POA: Diagnosis not present

## 2022-02-10 DIAGNOSIS — M9903 Segmental and somatic dysfunction of lumbar region: Secondary | ICD-10-CM | POA: Diagnosis not present

## 2022-02-10 DIAGNOSIS — M546 Pain in thoracic spine: Secondary | ICD-10-CM | POA: Diagnosis not present

## 2022-02-10 DIAGNOSIS — M9901 Segmental and somatic dysfunction of cervical region: Secondary | ICD-10-CM | POA: Diagnosis not present

## 2022-02-10 DIAGNOSIS — M62830 Muscle spasm of back: Secondary | ICD-10-CM | POA: Diagnosis not present

## 2022-02-10 DIAGNOSIS — M9902 Segmental and somatic dysfunction of thoracic region: Secondary | ICD-10-CM | POA: Diagnosis not present

## 2022-02-10 DIAGNOSIS — M62838 Other muscle spasm: Secondary | ICD-10-CM | POA: Diagnosis not present

## 2022-02-14 DIAGNOSIS — M9901 Segmental and somatic dysfunction of cervical region: Secondary | ICD-10-CM | POA: Diagnosis not present

## 2022-02-14 DIAGNOSIS — M9903 Segmental and somatic dysfunction of lumbar region: Secondary | ICD-10-CM | POA: Diagnosis not present

## 2022-02-14 DIAGNOSIS — M9902 Segmental and somatic dysfunction of thoracic region: Secondary | ICD-10-CM | POA: Diagnosis not present

## 2022-02-14 DIAGNOSIS — M542 Cervicalgia: Secondary | ICD-10-CM | POA: Diagnosis not present

## 2022-02-14 DIAGNOSIS — M545 Low back pain, unspecified: Secondary | ICD-10-CM | POA: Diagnosis not present

## 2022-02-14 DIAGNOSIS — M546 Pain in thoracic spine: Secondary | ICD-10-CM | POA: Diagnosis not present

## 2022-02-14 DIAGNOSIS — M62830 Muscle spasm of back: Secondary | ICD-10-CM | POA: Diagnosis not present

## 2022-02-14 DIAGNOSIS — M62838 Other muscle spasm: Secondary | ICD-10-CM | POA: Diagnosis not present

## 2022-02-16 DIAGNOSIS — M9903 Segmental and somatic dysfunction of lumbar region: Secondary | ICD-10-CM | POA: Diagnosis not present

## 2022-02-16 DIAGNOSIS — M62838 Other muscle spasm: Secondary | ICD-10-CM | POA: Diagnosis not present

## 2022-02-16 DIAGNOSIS — M546 Pain in thoracic spine: Secondary | ICD-10-CM | POA: Diagnosis not present

## 2022-02-16 DIAGNOSIS — M9901 Segmental and somatic dysfunction of cervical region: Secondary | ICD-10-CM | POA: Diagnosis not present

## 2022-02-16 DIAGNOSIS — M62830 Muscle spasm of back: Secondary | ICD-10-CM | POA: Diagnosis not present

## 2022-02-16 DIAGNOSIS — M545 Low back pain, unspecified: Secondary | ICD-10-CM | POA: Diagnosis not present

## 2022-02-16 DIAGNOSIS — M542 Cervicalgia: Secondary | ICD-10-CM | POA: Diagnosis not present

## 2022-02-16 DIAGNOSIS — M9902 Segmental and somatic dysfunction of thoracic region: Secondary | ICD-10-CM | POA: Diagnosis not present

## 2022-02-17 ENCOUNTER — Ambulatory Visit (INDEPENDENT_AMBULATORY_CARE_PROVIDER_SITE_OTHER): Payer: PPO | Admitting: Psychiatry

## 2022-02-17 DIAGNOSIS — Z635 Disruption of family by separation and divorce: Secondary | ICD-10-CM

## 2022-02-17 DIAGNOSIS — Z630 Problems in relationship with spouse or partner: Secondary | ICD-10-CM

## 2022-02-17 DIAGNOSIS — M9901 Segmental and somatic dysfunction of cervical region: Secondary | ICD-10-CM | POA: Diagnosis not present

## 2022-02-17 DIAGNOSIS — M545 Low back pain, unspecified: Secondary | ICD-10-CM | POA: Diagnosis not present

## 2022-02-17 DIAGNOSIS — M542 Cervicalgia: Secondary | ICD-10-CM | POA: Diagnosis not present

## 2022-02-17 DIAGNOSIS — M9903 Segmental and somatic dysfunction of lumbar region: Secondary | ICD-10-CM | POA: Diagnosis not present

## 2022-02-17 DIAGNOSIS — F319 Bipolar disorder, unspecified: Secondary | ICD-10-CM

## 2022-02-17 DIAGNOSIS — M546 Pain in thoracic spine: Secondary | ICD-10-CM | POA: Diagnosis not present

## 2022-02-17 DIAGNOSIS — M62838 Other muscle spasm: Secondary | ICD-10-CM | POA: Diagnosis not present

## 2022-02-17 DIAGNOSIS — M9902 Segmental and somatic dysfunction of thoracic region: Secondary | ICD-10-CM | POA: Diagnosis not present

## 2022-02-17 DIAGNOSIS — M62830 Muscle spasm of back: Secondary | ICD-10-CM | POA: Diagnosis not present
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
## 2022-02-21 DIAGNOSIS — M546 Pain in thoracic spine: Secondary | ICD-10-CM | POA: Diagnosis not present

## 2022-02-21 DIAGNOSIS — M62830 Muscle spasm of back: Secondary | ICD-10-CM | POA: Diagnosis not present

## 2022-02-21 DIAGNOSIS — M542 Cervicalgia: Secondary | ICD-10-CM | POA: Diagnosis not present

## 2022-02-21 DIAGNOSIS — M545 Low back pain, unspecified: Secondary | ICD-10-CM | POA: Diagnosis not present

## 2022-02-21 DIAGNOSIS — M9901 Segmental and somatic dysfunction of cervical region: Secondary | ICD-10-CM | POA: Diagnosis not present

## 2022-02-21 DIAGNOSIS — M62838 Other muscle spasm: Secondary | ICD-10-CM | POA: Diagnosis not present

## 2022-02-21 DIAGNOSIS — M9903 Segmental and somatic dysfunction of lumbar region: Secondary | ICD-10-CM | POA: Diagnosis not present

## 2022-02-21 DIAGNOSIS — M9902 Segmental and somatic dysfunction of thoracic region: Secondary | ICD-10-CM | POA: Diagnosis not present

## 2022-02-22 DIAGNOSIS — C44612 Basal cell carcinoma of skin of right upper limb, including shoulder: Secondary | ICD-10-CM | POA: Diagnosis not present

## 2022-02-23 DIAGNOSIS — M542 Cervicalgia: Secondary | ICD-10-CM | POA: Diagnosis not present

## 2022-02-23 DIAGNOSIS — M9901 Segmental and somatic dysfunction of cervical region: Secondary | ICD-10-CM | POA: Diagnosis not present

## 2022-02-23 DIAGNOSIS — M9903 Segmental and somatic dysfunction of lumbar region: Secondary | ICD-10-CM | POA: Diagnosis not present

## 2022-02-23 DIAGNOSIS — M62830 Muscle spasm of back: Secondary | ICD-10-CM | POA: Diagnosis not present

## 2022-02-23 DIAGNOSIS — M545 Low back pain, unspecified: Secondary | ICD-10-CM | POA: Diagnosis not present

## 2022-02-23 DIAGNOSIS — M62838 Other muscle spasm: Secondary | ICD-10-CM | POA: Diagnosis not present

## 2022-02-23 DIAGNOSIS — M9902 Segmental and somatic dysfunction of thoracic region: Secondary | ICD-10-CM | POA: Diagnosis not present

## 2022-02-23 DIAGNOSIS — M546 Pain in thoracic spine: Secondary | ICD-10-CM | POA: Diagnosis not present

## 2022-02-24 DIAGNOSIS — M545 Low back pain, unspecified: Secondary | ICD-10-CM | POA: Diagnosis not present

## 2022-02-24 DIAGNOSIS — M62830 Muscle spasm of back: Secondary | ICD-10-CM | POA: Diagnosis not present

## 2022-02-24 DIAGNOSIS — M9901 Segmental and somatic dysfunction of cervical region: Secondary | ICD-10-CM | POA: Diagnosis not present

## 2022-02-24 DIAGNOSIS — M62838 Other muscle spasm: Secondary | ICD-10-CM | POA: Diagnosis not present

## 2022-02-24 DIAGNOSIS — M9902 Segmental and somatic dysfunction of thoracic region: Secondary | ICD-10-CM | POA: Diagnosis not present

## 2022-02-24 DIAGNOSIS — M9903 Segmental and somatic dysfunction of lumbar region: Secondary | ICD-10-CM | POA: Diagnosis not present

## 2022-02-24 DIAGNOSIS — M546 Pain in thoracic spine: Secondary | ICD-10-CM | POA: Diagnosis not present

## 2022-02-24 DIAGNOSIS — M542 Cervicalgia: Secondary | ICD-10-CM | POA: Diagnosis not present

## 2022-02-25 DIAGNOSIS — G4733 Obstructive sleep apnea (adult) (pediatric): Secondary | ICD-10-CM | POA: Diagnosis not present

## 2022-02-26 ENCOUNTER — Emergency Department (HOSPITAL_COMMUNITY): Payer: PPO

## 2022-02-26 ENCOUNTER — Emergency Department (HOSPITAL_COMMUNITY)
Admission: EM | Admit: 2022-02-26 | Discharge: 2022-02-27 | Disposition: A | Discharge status: Home / Self Care | Payer: PPO | Attending: Emergency Medicine | Admitting: Emergency Medicine

## 2022-02-26 ENCOUNTER — Other Ambulatory Visit: Payer: Self-pay

## 2022-02-26 ENCOUNTER — Encounter (HOSPITAL_COMMUNITY): Payer: Self-pay

## 2022-02-26 DIAGNOSIS — W1839XA Other fall on same level, initial encounter: Secondary | ICD-10-CM | POA: Insufficient documentation

## 2022-02-26 DIAGNOSIS — S36892A Contusion of other intra-abdominal organs, initial encounter: Secondary | ICD-10-CM

## 2022-02-26 DIAGNOSIS — W19XXXA Unspecified fall, initial encounter: Secondary | ICD-10-CM

## 2022-02-26 DIAGNOSIS — M25552 Pain in left hip: Secondary | ICD-10-CM | POA: Diagnosis not present

## 2022-02-26 DIAGNOSIS — R109 Unspecified abdominal pain: Secondary | ICD-10-CM | POA: Diagnosis not present

## 2022-02-26 DIAGNOSIS — Z96642 Presence of left artificial hip joint: Secondary | ICD-10-CM | POA: Diagnosis not present

## 2022-02-26 DIAGNOSIS — K661 Hemoperitoneum: Secondary | ICD-10-CM | POA: Diagnosis not present

## 2022-02-26 DIAGNOSIS — S79912A Unspecified injury of left hip, initial encounter: Secondary | ICD-10-CM | POA: Diagnosis not present

## 2022-02-26 LAB — CBC WITH DIFFERENTIAL/PLATELET
Abs Immature Granulocytes: 0.05 10*3/uL (ref 0.00–0.07)
Basophils Absolute: 0 10*3/uL (ref 0.0–0.1)
Basophils Relative: 0 %
Eosinophils Absolute: 0 10*3/uL (ref 0.0–0.5)
Eosinophils Relative: 0 %
HCT: 41 % (ref 39.0–52.0)
Hemoglobin: 13.6 g/dL (ref 13.0–17.0)
Immature Granulocytes: 0 %
Lymphocytes Relative: 18 %
Lymphs Abs: 2.1 10*3/uL (ref 0.7–4.0)
MCH: 30.6 pg (ref 26.0–34.0)
MCHC: 33.2 g/dL (ref 30.0–36.0)
MCV: 92.1 fL (ref 80.0–100.0)
Monocytes Absolute: 0.7 10*3/uL (ref 0.1–1.0)
Monocytes Relative: 6 %
Neutro Abs: 9.1 10*3/uL — ABNORMAL HIGH (ref 1.7–7.7)
Neutrophils Relative %: 76 %
Platelets: 210 10*3/uL (ref 150–400)
RBC: 4.45 MIL/uL (ref 4.22–5.81)
RDW: 13.1 % (ref 11.5–15.5)
WBC: 11.9 10*3/uL — ABNORMAL HIGH (ref 4.0–10.5)
nRBC: 0 % (ref 0.0–0.2)

## 2022-02-26 LAB — BASIC METABOLIC PANEL
Anion gap: 10 (ref 5–15)
BUN: 26 mg/dL — ABNORMAL HIGH (ref 8–23)
CO2: 23 mmol/L (ref 22–32)
Calcium: 8.6 mg/dL — ABNORMAL LOW (ref 8.9–10.3)
Chloride: 102 mmol/L (ref 98–111)
Creatinine, Ser: 0.86 mg/dL (ref 0.61–1.24)
GFR, Estimated: >60 mL/min (ref >60)
Glucose, Bld: 89 mg/dL (ref 70–99)
Potassium: 4.3 mmol/L (ref 3.5–5.1)
Sodium: 135 mmol/L (ref 135–145)

## 2022-02-26 MED ORDER — OXYCODONE-ACETAMINOPHEN 5-325 MG PO TABS
2.0000 | ORAL_TABLET | Freq: Once | ORAL | Status: AC
  Administered 2022-02-26: 2 via ORAL
  Filled 2022-02-26: qty 2

## 2022-02-26 MED ORDER — FENTANYL CITRATE PF 50 MCG/ML IJ SOSY
100.0000 ug | PREFILLED_SYRINGE | Freq: Once | INTRAMUSCULAR | Status: AC
  Administered 2022-02-26: 100 ug via INTRAVENOUS
  Filled 2022-02-26: qty 2

## 2022-02-26 MED ORDER — OXYCODONE HCL 5 MG PO TABS: 5.0000 mg | ORAL_TABLET | ORAL | 0 refills | Status: DC | PRN

## 2022-02-26 MED ORDER — ONDANSETRON HCL 4 MG/2ML IJ SOLN
4.0000 mg | Freq: Once | INTRAMUSCULAR | Status: AC
  Administered 2022-02-26: 4 mg via INTRAVENOUS
  Filled 2022-02-26: qty 2

## 2022-02-26 MED ORDER — IOHEXOL 350 MG/ML SOLN
100.0000 mL | Freq: Once | INTRAVENOUS | Status: AC | PRN
  Administered 2022-02-26: 100 mL via INTRAVENOUS

## 2022-02-26 MED ORDER — LACTATED RINGERS IV BOLUS
500.0000 mL | Freq: Once | INTRAVENOUS | Status: AC
  Administered 2022-02-26: 500 mL via INTRAVENOUS

## 2022-02-26 NOTE — ED Triage Notes (Signed)
BIB EMS for fall, left hip pain. Pt had left hip replaced 4 years ago. BP 130/60 HR 96 O2 96%

## 2022-02-26 NOTE — ED Provider Notes (Signed)
[place] DEPT Provider Note   CSN: [id number] Arrival date & time: 02/26/22  1747     History Chief Complaint  Patient presents with   Hip Pain    HPI WOJCIECH WILLETTS is a 71 y.o. male presenting for left-sided hip pain.  Patient has a history of left-sided hip replacement.  He had a ground-level fall tonight and unfortunately has substantial pain in his left hip.  He denies fevers or chills, nausea or vomiting, syncope shortness of breath.  He is otherwise ambulatory tolerating p.o. intake Prior to the fall.   Patient's recorded medical, surgical, social, medication list and allergies were reviewed in the Snapshot window as part of the initial history.   Review of Systems   Review of Systems  Constitutional:  Negative for chills and fever.  HENT:  Negative for ear pain and sore throat.   Eyes:  Negative for pain and visual disturbance.  Respiratory:  Negative for cough and shortness of breath.   Cardiovascular:  Negative for chest pain and palpitations.  Gastrointestinal:  Negative for abdominal pain and vomiting.  Genitourinary:  Negative for dysuria and hematuria.  Musculoskeletal:  Negative for arthralgias and back pain.  Skin:  Negative for color change and rash.  Neurological:  Negative for seizures and syncope.  All other systems reviewed and are negative.   Physical Exam Updated Vital Signs BP 132/77   Pulse 63   Temp 98.4 F (36.9 C) (Oral)   Resp 17   Ht '5\' 9"'$  (1.753 m)   Wt 85 kg   SpO2 100%   BMI 27.67 kg/m  Physical Exam Vitals and nursing note reviewed.  Constitutional:      General: He is not in acute distress.    Appearance: He is well-developed.  HENT:     Head: Normocephalic and atraumatic.  Eyes:     Conjunctiva/sclera: Conjunctivae normal.  Cardiovascular:     Rate and Rhythm: Normal rate and regular rhythm.     Heart sounds: No murmur heard. Pulmonary:     Effort: Pulmonary effort is normal. No  respiratory distress.     Breath sounds: Normal breath sounds.  Abdominal:     Palpations: Abdomen is soft.     Tenderness: There is no abdominal tenderness.  Musculoskeletal:        General: No swelling.     Cervical back: Neck supple.  Skin:    General: Skin is warm and dry.     Capillary Refill: Capillary refill takes less than 2 seconds.  Neurological:     Mental Status: He is alert.  Psychiatric:        Mood and Affect: Mood normal.      ED Course/ Medical Decision Making/ A&P    Procedures Procedures   Medications Ordered in ED Medications  oxyCODONE-acetaminophen (PERCOCET/ROXICET) 5-325 MG per tablet 2 tablet (2 tablets Oral Given 02/26/22 1859)  fentaNYL (SUBLIMAZE) injection 100 mcg (100 mcg Intravenous Given 02/26/22 2134)  ondansetron (ZOFRAN) injection 4 mg (4 mg Intravenous Given 02/26/22 2134)  lactated ringers bolus 500 mL (500 mLs Intravenous New Bag/Given 02/26/22 2132)   Medical Decision Making:   XIAN ALVES is a 71 y.o. male who presented to the ED today with fall detailed above.    Patient's presentation is complicated by their history of hip replacement.  Patient placed on continuous vitals and telemetry monitoring while in ED which was reviewed periodically.  Complete initial physical exam performed, notably the patient  was hemodynamically stable in no acute distress.  He has substantial left-sided hip pain..    Reviewed and confirmed nursing documentation for past medical history, family history, social history.    Initial Assessment:   With the patient's presentation of fall and hip pain, most likely diagnosis is orthopedic injury. Other diagnoses were considered including (but not limited to) blunt traumatic injury. These are considered less likely due to history of present illness and physical exam findings.   This is most consistent with an acute life/limb threatening illness complicated by underlying chronic conditions.  Initial Plan:  X-ray  performed of the left hip demonstrating no acute fracture Given patient's ongoing substantial pain, we will progress to CT of the left hip. Objective evaluation as below reviewed   Initial Study Results:   Radiology images reviewed and agree with read per radiology. CT Hip Left Wo Contrast  Result Date: 02/26/2022 CLINICAL DATA:  Left hip pain after falling EXAM: CT OF THE LEFT HIP WITHOUT CONTRAST TECHNIQUE: Multidetector CT imaging of the left hip was performed according to the standard protocol. Multiplanar CT image reconstructions were also generated. RADIATION DOSE REDUCTION: This exam was performed according to the departmental dose-optimization program which includes automated exposure control, adjustment of the mA and/or kV according to patient size and/or use of iterative reconstruction technique. COMPARISON:  Radiographs earlier today FINDINGS: Bones/Joint/Cartilage No acute fracture or dislocation. Left THA. No evidence of loosening. Degenerative changes right hip, pubic symphysis, SI joints and lower lumbar spine. Ligaments Suboptimally assessed by CT. Muscles and Tendons Enlarged left iliacus muscle likely due to hematoma. Soft tissues Small amount of fluid in the left pelvis about the left psoas and iliacus muscles. IMPRESSION: Negative for acute fracture or dislocation. Left iliacus intramuscular hematoma with small amount of left retroperitoneal hemorrhage. CTA is recommended if there is concern for active bleeding. Electronically Signed   By: Placido Sou M.D.   On: 02/26/2022 21:09   DG Hip Unilat With Pelvis 2-3 Views Left  Result Date: 02/26/2022 CLINICAL DATA:  Fall with hip pain. Previous history of surgery to the left hip in February 2020. EXAM: DG HIP (WITH OR WITHOUT PELVIS) 2-3V LEFT COMPARISON:  Intraoperative left hip radiographs 07/18/2018 FINDINGS: There is no evidence of hip fracture or dislocation. Left hip prosthesis is intact and in appropriate position. Moderate  osteoarthritis of the right hip joint. Degenerative disc disease at the L5-S1 level. IMPRESSION: 1. No acute fracture or dislocation. Left hip prosthesis is intact without evidence of complication. 2. Moderate osteoarthritis of the right hip joint. Electronically Signed   By: Ileana Roup M.D.   On: 02/26/2022 18:54     Final Assessment and Plan:   On repeat assessment, patient unfortunately continued to have fairly severe pain in his left hip.  Medications administered without any symptomatic improvement.  Given his ongoing pain, lack of other localizing etiology.  We will proceed with CTA as ordered CT results are pending at time of handoff, if patient's pain is completely resolved and h his scan negative for active hemorrhage, he may be for outpatient care management.  However he will require reassessment for symptomatic treatment.  Clinical Impression:  1. Fall, initial encounter      Data Unavailable   Final Clinical Impression(s) / ED Diagnoses Final diagnoses:  Fall, initial encounter    Rx / DC Orders ED Discharge Orders          Ordered    oxyCODONE (ROXICODONE) 5 MG immediate release tablet  Every 4 hours PRN        02/26/22 2310              Tretha Sciara, MD 02/26/22 2310

## 2022-02-27 DIAGNOSIS — R109 Unspecified abdominal pain: Secondary | ICD-10-CM | POA: Diagnosis not present

## 2022-02-27 DIAGNOSIS — M25552 Pain in left hip: Secondary | ICD-10-CM | POA: Diagnosis not present

## 2022-02-27 NOTE — ED Notes (Signed)
Ambulatory to bathroom.

## 2022-02-27 NOTE — ED Provider Notes (Signed)
I assumed care of this patient.  Please see previous provider note for further details of Hx, PE.  Briefly patient is a 71 y.o. male who presented after ground-level fall with severe left pelvis pain found to have a hematoma near the iliac muscle currently pending CTA to assess for active bleed.  After pain medicine patient's more comfortable.  CTA does reveal small amount of active bleeding. I spoke with Dr. Ninfa Linden from general surgery who mentioned that there was no need for surgical intervention at this time. Monitoring would be recommended.  Patient declined admission for monitoring.   I have discussed the findings, Dx and Tx plan with the patient/family who expressed understanding and agree(s) with the plan. Discharge instructions discussed at length. The patient/family was given strict return precautions who verbalized understanding of the instructions. No further questions at time of discharge.  Disposition: Discharge  Condition: Good  ED Discharge Orders          Ordered    oxyCODONE (ROXICODONE) 5 MG immediate release tablet  Every 4 hours PRN        02/26/22 2310            Follow Up: [facility] [place] [id number] [place] [phone] Go to  For severe pelvic pain, lightheadedness, paleness, general fatigue.  [name], MD [address] [place] [address] [phone]  Call  as needed  Rod Can, [place] [address] [place] [address] [phone]  Call  as needed          Fatima Blank, MD 02/27/22 [phone]

## 2022-02-28 DIAGNOSIS — M9901 Segmental and somatic dysfunction of cervical region: Secondary | ICD-10-CM | POA: Diagnosis not present

## 2022-02-28 DIAGNOSIS — M62830 Muscle spasm of back: Secondary | ICD-10-CM | POA: Diagnosis not present

## 2022-02-28 DIAGNOSIS — M546 Pain in thoracic spine: Secondary | ICD-10-CM | POA: Diagnosis not present

## 2022-02-28 DIAGNOSIS — M9902 Segmental and somatic dysfunction of thoracic region: Secondary | ICD-10-CM | POA: Diagnosis not present

## 2022-02-28 DIAGNOSIS — M62838 Other muscle spasm: Secondary | ICD-10-CM | POA: Diagnosis not present

## 2022-02-28 DIAGNOSIS — M545 Low back pain, unspecified: Secondary | ICD-10-CM | POA: Diagnosis not present

## 2022-02-28 DIAGNOSIS — M542 Cervicalgia: Secondary | ICD-10-CM | POA: Diagnosis not present

## 2022-02-28 DIAGNOSIS — M9903 Segmental and somatic dysfunction of lumbar region: Secondary | ICD-10-CM | POA: Diagnosis not present

## 2022-03-02 ENCOUNTER — Other Ambulatory Visit: Payer: Self-pay

## 2022-03-02 ENCOUNTER — Telehealth: Payer: Self-pay | Admitting: Nurse Practitioner

## 2022-03-02 DIAGNOSIS — I10 Essential (primary) hypertension: Secondary | ICD-10-CM

## 2022-03-02 DIAGNOSIS — M9903 Segmental and somatic dysfunction of lumbar region: Secondary | ICD-10-CM | POA: Diagnosis not present

## 2022-03-02 DIAGNOSIS — M542 Cervicalgia: Secondary | ICD-10-CM | POA: Diagnosis not present

## 2022-03-02 DIAGNOSIS — M62830 Muscle spasm of back: Secondary | ICD-10-CM | POA: Diagnosis not present

## 2022-03-02 DIAGNOSIS — M9902 Segmental and somatic dysfunction of thoracic region: Secondary | ICD-10-CM | POA: Diagnosis not present

## 2022-03-02 DIAGNOSIS — M545 Low back pain, unspecified: Secondary | ICD-10-CM | POA: Diagnosis not present

## 2022-03-02 DIAGNOSIS — M546 Pain in thoracic spine: Secondary | ICD-10-CM | POA: Diagnosis not present

## 2022-03-02 DIAGNOSIS — M9901 Segmental and somatic dysfunction of cervical region: Secondary | ICD-10-CM | POA: Diagnosis not present

## 2022-03-02 DIAGNOSIS — M62838 Other muscle spasm: Secondary | ICD-10-CM | POA: Diagnosis not present

## 2022-03-02 DIAGNOSIS — Z708 Other sex counseling: Secondary | ICD-10-CM | POA: Diagnosis not present

## 2022-03-02 MED ORDER — BISOPROLOL-HYDROCHLOROTHIAZIDE 5-6.25 MG PO TABS: ORAL_TABLET | ORAL | 3 refills | Status: DC

## 2022-03-02 NOTE — Telephone Encounter (Signed)
Done

## 2022-03-02 NOTE — Telephone Encounter (Signed)
Pt is requesting refill on bisoprolol to pharmacy on file

## 2022-03-03 DIAGNOSIS — M62830 Muscle spasm of back: Secondary | ICD-10-CM | POA: Diagnosis not present

## 2022-03-03 DIAGNOSIS — M9901 Segmental and somatic dysfunction of cervical region: Secondary | ICD-10-CM | POA: Diagnosis not present

## 2022-03-03 DIAGNOSIS — M546 Pain in thoracic spine: Secondary | ICD-10-CM | POA: Diagnosis not present

## 2022-03-03 DIAGNOSIS — M542 Cervicalgia: Secondary | ICD-10-CM | POA: Diagnosis not present

## 2022-03-03 DIAGNOSIS — M545 Low back pain, unspecified: Secondary | ICD-10-CM | POA: Diagnosis not present

## 2022-03-03 DIAGNOSIS — M9903 Segmental and somatic dysfunction of lumbar region: Secondary | ICD-10-CM | POA: Diagnosis not present

## 2022-03-03 DIAGNOSIS — M9902 Segmental and somatic dysfunction of thoracic region: Secondary | ICD-10-CM | POA: Diagnosis not present

## 2022-03-03 DIAGNOSIS — M62838 Other muscle spasm: Secondary | ICD-10-CM | POA: Diagnosis not present

## 2022-03-07 DIAGNOSIS — M9902 Segmental and somatic dysfunction of thoracic region: Secondary | ICD-10-CM | POA: Diagnosis not present

## 2022-03-07 DIAGNOSIS — M9903 Segmental and somatic dysfunction of lumbar region: Secondary | ICD-10-CM | POA: Diagnosis not present

## 2022-03-07 DIAGNOSIS — M62830 Muscle spasm of back: Secondary | ICD-10-CM | POA: Diagnosis not present

## 2022-03-07 DIAGNOSIS — M542 Cervicalgia: Secondary | ICD-10-CM | POA: Diagnosis not present

## 2022-03-07 DIAGNOSIS — M546 Pain in thoracic spine: Secondary | ICD-10-CM | POA: Diagnosis not present

## 2022-03-07 DIAGNOSIS — M9901 Segmental and somatic dysfunction of cervical region: Secondary | ICD-10-CM | POA: Diagnosis not present

## 2022-03-07 DIAGNOSIS — M62838 Other muscle spasm: Secondary | ICD-10-CM | POA: Diagnosis not present

## 2022-03-07 DIAGNOSIS — M545 Low back pain, unspecified: Secondary | ICD-10-CM | POA: Diagnosis not present

## 2022-03-09 DIAGNOSIS — M9902 Segmental and somatic dysfunction of thoracic region: Secondary | ICD-10-CM | POA: Diagnosis not present

## 2022-03-09 DIAGNOSIS — M9901 Segmental and somatic dysfunction of cervical region: Secondary | ICD-10-CM | POA: Diagnosis not present

## 2022-03-09 DIAGNOSIS — M545 Low back pain, unspecified: Secondary | ICD-10-CM | POA: Diagnosis not present

## 2022-03-09 DIAGNOSIS — M546 Pain in thoracic spine: Secondary | ICD-10-CM | POA: Diagnosis not present

## 2022-03-09 DIAGNOSIS — M62830 Muscle spasm of back: Secondary | ICD-10-CM | POA: Diagnosis not present

## 2022-03-09 DIAGNOSIS — M62838 Other muscle spasm: Secondary | ICD-10-CM | POA: Diagnosis not present

## 2022-03-09 DIAGNOSIS — M542 Cervicalgia: Secondary | ICD-10-CM | POA: Diagnosis not present

## 2022-03-09 DIAGNOSIS — M9903 Segmental and somatic dysfunction of lumbar region: Secondary | ICD-10-CM | POA: Diagnosis not present

## 2022-03-10 DIAGNOSIS — M9901 Segmental and somatic dysfunction of cervical region: Secondary | ICD-10-CM | POA: Diagnosis not present

## 2022-03-10 DIAGNOSIS — M62830 Muscle spasm of back: Secondary | ICD-10-CM | POA: Diagnosis not present

## 2022-03-10 DIAGNOSIS — M62838 Other muscle spasm: Secondary | ICD-10-CM | POA: Diagnosis not present

## 2022-03-10 DIAGNOSIS — M9902 Segmental and somatic dysfunction of thoracic region: Secondary | ICD-10-CM | POA: Diagnosis not present

## 2022-03-10 DIAGNOSIS — M545 Low back pain, unspecified: Secondary | ICD-10-CM | POA: Diagnosis not present

## 2022-03-10 DIAGNOSIS — M542 Cervicalgia: Secondary | ICD-10-CM | POA: Diagnosis not present

## 2022-03-10 DIAGNOSIS — M546 Pain in thoracic spine: Secondary | ICD-10-CM | POA: Diagnosis not present

## 2022-03-10 DIAGNOSIS — M9903 Segmental and somatic dysfunction of lumbar region: Secondary | ICD-10-CM | POA: Diagnosis not present

## 2022-03-17 DIAGNOSIS — E782 Mixed hyperlipidemia: Secondary | ICD-10-CM | POA: Diagnosis not present

## 2022-03-17 DIAGNOSIS — R7982 Elevated C-reactive protein (CRP): Secondary | ICD-10-CM | POA: Diagnosis not present

## 2022-03-17 DIAGNOSIS — E509 Vitamin A deficiency, unspecified: Secondary | ICD-10-CM | POA: Diagnosis not present

## 2022-03-17 DIAGNOSIS — E559 Vitamin D deficiency, unspecified: Secondary | ICD-10-CM | POA: Diagnosis not present

## 2022-03-17 DIAGNOSIS — E7212 Methylenetetrahydrofolate reductase deficiency: Secondary | ICD-10-CM | POA: Diagnosis not present

## 2022-03-17 DIAGNOSIS — E279 Disorder of adrenal gland, unspecified: Secondary | ICD-10-CM | POA: Diagnosis not present

## 2022-03-17 DIAGNOSIS — E039 Hypothyroidism, unspecified: Secondary | ICD-10-CM | POA: Diagnosis not present

## 2022-03-17 DIAGNOSIS — R5383 Other fatigue: Secondary | ICD-10-CM | POA: Diagnosis not present

## 2022-03-17 DIAGNOSIS — E8881 Metabolic syndrome: Secondary | ICD-10-CM | POA: Diagnosis not present

## 2022-03-17 DIAGNOSIS — N951 Menopausal and female climacteric states: Secondary | ICD-10-CM | POA: Diagnosis not present

## 2022-03-17 DIAGNOSIS — E790 Hyperuricemia without signs of inflammatory arthritis and tophaceous disease: Secondary | ICD-10-CM | POA: Diagnosis not present

## 2022-03-17 DIAGNOSIS — E063 Autoimmune thyroiditis: Secondary | ICD-10-CM | POA: Diagnosis not present

## 2022-03-20 ENCOUNTER — Encounter: Payer: Self-pay | Admitting: Internal Medicine

## 2022-03-20 NOTE — Progress Notes (Unsigned)
Future Appointments  Date Time Provider Department  03/21/2022              ov  2:30 PM [name], MD [facility]  04/20/2022  3:00 PM Blanchie Serve, PhD CP-CP  08/02/2022                  cpe  3:00 PM [name], MD [facility]  10/20/2022  2:00 PM Cottle, Billey Co., MD CP-CP  11/17/2022  2:00 PM Ward Givens, NP [facility]  12/13/2022  2:00 PM Alycia Rossetti, NP [facility]    History of Present Illness:       This very nice 71 y.o. MWM presents for 3 month follow up with HTN, HLD, Pre-Diabetes and Vitamin D Deficiency.  Dr Clovis Pu follows patient on Depakote for Bipolar Depression .  Patient also has c/o bilateral lower extremity weakness of obscure etiology & is followed by Dr Tomi Likens.  Patient had a low  Vit B12 level of  429 in Feb 2023 & 458 in July 2023.        Patient is treated for HTN (2012) & BP has been controlled at home. Today's BP is at goal - 132/80. Patient has had no complaints of any cardiac type chest pain, palpitations, dyspnea / orthopnea / PND, dizziness, claudication, or dependent edema.       Hyperlipidemia is controlled with diet & meds. Patient denies myalgias or other med SE's. Last Lipids were at goal:  Lab Results  Component Value Date   CHOL 152 07/27/2021   HDL 53 07/27/2021   LDLCALC 79 07/27/2021   TRIG 122 07/27/2021   CHOLHDL 2.9 07/27/2021     Also, the patient is monitored for glucose intolerance  and has had no symptoms of reactive hypoglycemia, diabetic polys, paresthesias or visual blurring.  Last A1c was normal & at goal:  Lab Results  Component Value Date   HGBA1C 5.2 07/27/2021                                                         Further, the patient also has history of Vitamin D Deficiency ("28" /2017)  and supplements vitamin D without any suspected side-effects. Last vitamin D was at goal:    Lab Results  Component Value Date   VD25OH 97 07/27/2021        Current Outpatient Medications  Medication  Instructions   VITAMIN C  1 tablet daily   aspirin EC    81 mg Daily   bisoprolol-hctz 5-6.25 MG tablet TAKE 1 TABLET  EVERY DAY   DEPAKOTE  500 mg 2 times daily   oxyCODONE  5 mg  Every 4 hours PRN   Vitamin D  5,000 Units Every evening     Allergies  Allergen Reactions   Ciprofloxacin Diarrhea    PMHx:   Past Medical History:  Diagnosis Date   Arthritis    Bipolar disorder ([place])    controlled by meds since 1997   Pneumonia    Sleep apnea    cpap   Wears glasses    reading     Immunization History  Administered Date(s) Administered   Moderna Sars-Covid-2 Vacc 07/02/2019, 07/30/2019   PFIZER-SARS-COV-2 Vacc 03/13/2020   Pneumococcal  - 13 04/05/2017   Pneumococcal  - 23  11/22/2018   Td 11/06/2017     Past Surgical History:  Procedure Laterality Date   APPENDECTOMY     COLONOSCOPY     KNEE ARTHROSCOPY W/ MENISCAL REPAIR  2004   left   ORIF HUMERUS FRACTURE Left 09/19/2013   Procedure: LEFT OPEN REDUCTION INTERNAL FIXATION (ORIF) PROXIMAL HUMERUS; ROTATOR CUFF REPAIR;  Surgeon: [name], MD;  Location: [facility];  Service: Orthopedics;  Laterality: Left;   TONSILLECTOMY     TOTAL HIP ARTHROPLASTY Left 07/18/2018   Procedure: TOTAL HIP ARTHROPLASTY ANTERIOR APPROACH;  Surgeon: Rod Can, MD;  Location: [facility];  Service: Orthopedics;  Laterality: Left;    FHx:    Reviewed / unchanged  SHx:    Reviewed / unchanged   Systems Review:  Constitutional: Denies fever, chills, wt changes, headaches, insomnia, fatigue, night sweats, change in appetite. Eyes: Denies redness, blurred vision, diplopia, discharge, itchy, watery eyes.  ENT: Denies discharge, congestion, post nasal drip, epistaxis, sore throat, earache, hearing loss, dental pain, tinnitus, vertigo, sinus pain, snoring.  CV: Denies chest pain, palpitations, irregular heartbeat, syncope, dyspnea, diaphoresis, orthopnea, PND, claudication or edema. Respiratory: denies cough, dyspnea,  DOE, pleurisy, hoarseness, laryngitis, wheezing.  Gastrointestinal: Denies dysphagia, odynophagia, heartburn, reflux, water brash, abdominal pain or cramps, nausea, vomiting, bloating, diarrhea, constipation, hematemesis, melena, hematochezia  or hemorrhoids. Genitourinary: Denies dysuria, frequency, urgency, nocturia, hesitancy, discharge, hematuria or flank pain. Musculoskeletal: Denies arthralgias, myalgias, stiffness, jt. swelling, pain, limping or strain/sprain.  Skin: Denies pruritus, rash, hives, warts, acne, eczema or change in skin lesion(s). Neuro: No weakness, tremor, incoordination, spasms, paresthesia or pain. Psychiatric: Denies confusion, memory loss or sensory loss. Endo: Denies change in weight, skin or hair change.  Heme/Lymph: No excessive bleeding, bruising or enlarged lymph nodes.  Physical Exam  There were no vitals taken for this visit.  Appears  well nourished, well groomed  and in no distress.  Eyes: PERRLA, EOMs, conjunctiva no swelling or erythema. Sinuses: No frontal/maxillary tenderness ENT/Mouth: EAC's clear, TM's nl w/o erythema, bulging. Nares clear w/o erythema, swelling, exudates. Oropharynx clear without erythema or exudates. Oral hygiene is good. Tongue normal, non obstructing. Hearing intact.  Neck: Supple. Thyroid not palpable. Car 2+/2+ without bruits, nodes or JVD. Chest: Respirations nl with BS clear & equal w/o rales, rhonchi, wheezing or stridor.  Cor: Heart sounds normal w/ regular rate and rhythm without sig. murmurs, gallops, clicks or rubs. Peripheral pulses normal and equal  without edema.  Abdomen: Soft & bowel sounds normal. Non-tender w/o guarding, rebound, hernias, masses or organomegaly.  Lymphatics: Unremarkable.  Musculoskeletal: Full ROM all peripheral extremities, joint stability, 5/5 strength and gait  with right limp.  Skin: Warm, dry without exposed rashes, lesions or ecchymosis apparent.  Neuro: Cranial nerves intact, reflexes  equal bilaterally. Sensory-motor testing grossly intact. Tendon reflexes grossly intact.  Pysch: Alert & oriented x 3.  Insight and judgement nl & appropriate. No ideations.  Assessment and Plan:   1. Essential hypertension  - Continue medication, monitor blood pressure at home.  - Continue DASH diet.  Reminder to go to the ER if any CP,  SOB, nausea, dizziness, severe HA, changes vision/speech.    - CBC with Differential/Platelet - COMPLETE METABOLIC PANEL WITH GFR - Magnesium - TSH  2.  Hyperlipidemia, mixed - Continue diet/meds, exercise,& lifestyle modifications.  - Continue monitor periodic cholesterol/liver & renal functions   - Lipid panel - TSH  3. Abnormal glucose  - Continue diet, exercise  - Lifestyle modifications.  -  Monitor appropriate labs   - Hemoglobin A1c - Insulin, random  4. Vitamin D deficiency  - VITA - Continue supplementation  MIN D 25 Hydroxy   5. B12 deficiency  - Vitamin B12  6. Bipolar I disorder (HCC)  - Valproic acid level  7. Medication management  - CBC with Differential/Platelet - COMPLETE METABOLIC PANEL WITH GFR - Magnesium - Lipid panel - TSH - Hemoglobin A1c - Insulin, random - VITAMIN D 25 Hydroxy - Vitamin B12 - Valproic acid level         Discussed  regular exercise, BP monitoring, weight control to achieve/maintain BMI less than 25 and discussed med and SE's. Recommended labs to assess and monitor clinical status with further disposition pending results of labs.  I discussed the assessment and treatment plan with the patient. The patient was provided an opportunity to ask questions and all were answered. The patient agreed with the plan and demonstrated an understanding of the instructions.  I provided over 30 minutes of exam, counseling, chart review and  complex critical decision making.        The patient was advised to call back or seek an in-person evaluation if the symptoms worsen or if the condition fails  to improve as anticipated.   Kirtland Bouchard, MD

## 2022-03-20 NOTE — Patient Instructions (Addendum)
Due to recent changes in healthcare laws, you may see the results of your imaging and laboratory studies on MyChart before your provider has had a chance to review them.  We understand that in some cases there may be results that are confusing or concerning to you. Not all laboratory results come back in the same time frame and the provider may be waiting for multiple results in order to interpret others.  Please give [place] 48 hours in order for your provider to thoroughly review all the results before contacting the office for clarification of your results.  ++++++++++++++++++++++++++  -  Vitamin B12 has been  Very Low  (Ideal or Goal Vit B12 is between 450 - 1,100)     Low Vit B12 may be associated with Anemia , Fatigue,   Peripheral Neuropathy, Dementia, "Brain Fog", & Depression    - Recommend take a sub-lingual form of Vitamin B12 tablet   1,000 to 5,000 mcg tab that you dissolve under your tongue /Daily   - Can get Baron Sane - best price at [employer] or on [employer]  ++++++++++++++++++++++++++  Vit D  & Vit C 1,000 mg   are recommended to help protect  against the Covid-19 and other Corona viruses.    Also it's recommended  to take  Zinc 50 mg  to help  protect against the Covid-19   and best place to get  is also on [employer]com  and don't pay more than 6-8 cents /pill !   +++++++++++++++++++++++++++++++++++++++ Recommend Adult Low Dose Aspirin or  coated  Aspirin 81 mg daily  To reduce risk of Colon Cancer 40 %,  Skin Cancer 26 % ,  Melanoma 46%  and  Pancreatic cancer 60% +++++++++++++++++++++++++++++++++++++++++ Vitamin D goal  is between 70-100.  Please make sure that you are taking your Vitamin D as directed.  It is very important as a natural anti-inflammatory  helping hair, skin, and nails, as well as reducing stroke and heart attack risk.  It helps your bones and helps with mood. It also decreases numerous cancer risks so please take it as directed.  Low  Vit D is associated with a 200-300% higher risk for CANCER  and 200-300% higher risk for HEART   ATTACK  &  STROKE.   .....................................Marland Kitchen It is also associated with higher death rate at younger ages,  autoimmune diseases like Rheumatoid arthritis, Lupus, Multiple Sclerosis.    Also many other serious conditions, like depression, Alzheimer's Dementia, infertility, muscle aches, fatigue, fibromyalgia - just to name a few.  +++++++++++++++++++++++++++++++++++++++++  Recommend the book "The END of DIETING" by Dr Excell Seltzer  & the book "The END of DIABETES " by Dr Excell Seltzer  At [email or web address] - get book & Audio CD's    Being diabetic has a  300% increased risk for heart attack, stroke, cancer, and alzheimer- type vascular dementia. It is very important that you work harder with diet by avoiding all foods that are white. Avoid white rice (brown & wild rice is OK), white potatoes (sweetpotatoes in moderation is OK), White bread or wheat bread or anything made out of white flour like bagels, donuts, rolls, buns, biscuits, cakes, pastries, cookies, pizza crust, and pasta (made from white flour & egg whites) - vegetarian pasta or spinach or wheat pasta is OK. Multigrain breads like Arnold's or Pepper[employer], or multigrain sandwich thins or flatbreads.  Diet, exercise and weight loss can reverse and cure diabetes in the early stages.  Diet, exercise and  weight loss is very important in the control and prevention of complications of diabetes which affects every system in your body, ie. Brain - dementia/stroke, eyes - glaucoma/blindness, heart - heart attack/heart failure, kidneys - dialysis, stomach - gastric paralysis, intestines - malabsorption, nerves - severe painful neuritis, circulation - gangrene & loss of a leg(s), and finally cancer and Alzheimers.    I recommend avoid fried & greasy foods,  sweets/candy, white rice (brown or wild rice or Quinoa is OK), white potatoes (sweet  potatoes are OK) - anything made from white flour - bagels, doughnuts, rolls, buns, biscuits,white and wheat breads, pizza crust and traditional pasta made of white flour & egg white(vegetarian pasta or spinach or wheat pasta is OK).  Multi-grain bread is OK - like multi-grain flat bread or sandwich thins. Avoid alcohol in excess. Exercise is also important.    Eat all the vegetables you want - avoid meat, especially red meat and dairy - especially cheese.  Cheese is the most concentrated form of trans-fats which is the worst thing to clog up our arteries. Veggie cheese is OK which can be found in the fresh produce section at [employer] or [employer] or [employer]  +++++++++++++++++++++++++++++++++++++++ DASH Eating Plan  DASH stands for "Dietary Approaches to Stop Hypertension."   The DASH eating plan is a healthy eating plan that has been shown to reduce high blood pressure (hypertension). Additional health benefits may include reducing the risk of type 2 diabetes mellitus, heart disease, and stroke. The DASH eating plan may also help with weight loss. WHAT DO I NEED TO KNOW ABOUT THE DASH EATING PLAN? For the DASH eating plan, you will follow these general guidelines: Choose foods with a percent daily value for sodium of less than 5% (as listed on the food label). Use salt-free seasonings or herbs instead of table salt or sea salt. Check with your health care provider or pharmacist before using salt substitutes. Eat lower-sodium products, often labeled as "lower sodium" or "no salt added." Eat fresh foods. Eat more vegetables, fruits, and low-fat dairy products. Choose whole grains. Look for the word "whole" as the first word in the ingredient list. Choose fish  Limit sweets, desserts, sugars, and sugary drinks. Choose heart-healthy fats. Eat veggie cheese  Eat more home-cooked food and less restaurant, buffet, and fast food. Limit fried foods. Cook foods using methods other than  frying. Limit canned vegetables. If you do use them, rinse them well to decrease the sodium. When eating at a restaurant, ask that your food be prepared with less salt, or no salt if possible.                      WHAT FOODS CAN I EAT? Read Dr Fara Olden Fuhrman's books on The End of Dieting & The End of Diabetes  Grains Whole grain or whole wheat bread. Brown rice. Whole grain or whole wheat pasta. Quinoa, bulgur, and whole grain cereals. Low-sodium cereals. Corn or whole wheat flour tortillas. Whole grain cornbread. Whole grain crackers. Low-sodium crackers.  Vegetables Fresh or frozen vegetables (raw, steamed, roasted, or grilled). Low-sodium or reduced-sodium tomato and vegetable juices. Low-sodium or reduced-sodium tomato sauce and paste. Low-sodium or reduced-sodium canned vegetables.   Fruits All fresh, canned (in natural juice), or frozen fruits.  Protein Products  All fish and seafood.  Dried beans, peas, or lentils. Unsalted nuts and seeds. Unsalted canned beans.  Dairy Low-fat dairy products, such as skim or 1% milk, 2% or reduced-fat  cheeses, low-fat ricotta or cottage cheese, or plain low-fat yogurt. Low-sodium or reduced-sodium cheeses.  Fats and Oils Tub margarines without trans fats. Light or reduced-fat mayonnaise and salad dressings (reduced sodium). Avocado. Safflower, olive, or canola oils. Natural peanut or almond butter.  Other Unsalted popcorn and pretzels. The items listed above may not be a complete list of recommended foods or beverages. Contact your dietitian for more options.  +++++++++++++++  WHAT FOODS ARE NOT RECOMMENDED? Grains/ White flour or wheat flour White bread. White pasta. White rice. Refined cornbread. Bagels and croissants. Crackers that contain trans fat.  Vegetables  Creamed or fried vegetables. Vegetables in a . Regular canned vegetables. Regular canned tomato sauce and paste. Regular tomato and vegetable juices.  Fruits Dried fruits.  Canned fruit in light or heavy syrup. Fruit juice.  Meat and Other Protein Products Meat in general - RED meat & White meat.  Fatty cuts of meat. Ribs, chicken wings, all processed meats as bacon, sausage, bologna, salami, fatback, hot dogs, bratwurst and packaged luncheon meats.  Dairy Whole or 2% milk, cream, half-and-half, and cream cheese. Whole-fat or sweetened yogurt. Full-fat cheeses or blue cheese. Non-dairy creamers and whipped toppings. Processed cheese, cheese spreads, or cheese curds.  Condiments Onion and garlic salt, seasoned salt, table salt, and sea salt. Canned and packaged gravies. Worcestershire sauce. Tartar sauce. Barbecue sauce. Teriyaki sauce. Soy sauce, including reduced sodium. Steak sauce. Fish sauce. Oyster sauce. Cocktail sauce. Horseradish. Ketchup and mustard. Meat flavorings and tenderizers. Bouillon cubes. Hot sauce. Tabasco sauce. Marinades. Taco seasonings. Relishes.  Fats and Oils Butter, stick margarine, lard, shortening and bacon fat. Coconut, palm kernel, or palm oils. Regular salad dressings.  Pickles and olives. Salted popcorn and pretzels.  The items listed above may not be a complete list of foods and beverages to avoid.

## 2022-03-21 ENCOUNTER — Emergency Department (HOSPITAL_BASED_OUTPATIENT_CLINIC_OR_DEPARTMENT_OTHER)
Admission: EM | Admit: 2022-03-21 | Discharge: 2022-03-21 | Discharge status: Against Medical Advice | Payer: PPO | Attending: Emergency Medicine | Admitting: Emergency Medicine

## 2022-03-21 ENCOUNTER — Other Ambulatory Visit: Payer: Self-pay

## 2022-03-21 ENCOUNTER — Ambulatory Visit (INDEPENDENT_AMBULATORY_CARE_PROVIDER_SITE_OTHER): Payer: PPO | Admitting: Internal Medicine

## 2022-03-21 ENCOUNTER — Emergency Department (HOSPITAL_BASED_OUTPATIENT_CLINIC_OR_DEPARTMENT_OTHER): Payer: PPO | Admitting: Radiology

## 2022-03-21 ENCOUNTER — Encounter (HOSPITAL_BASED_OUTPATIENT_CLINIC_OR_DEPARTMENT_OTHER): Payer: Self-pay | Admitting: *Deleted

## 2022-03-21 VITALS — BP 120/80 | HR 76 | Temp 97.9°F | Resp 18 | Ht 69.0 in | Wt 186.0 lb

## 2022-03-21 DIAGNOSIS — E538 Deficiency of other specified B group vitamins: Secondary | ICD-10-CM | POA: Diagnosis not present

## 2022-03-21 DIAGNOSIS — Z9181 History of falling: Secondary | ICD-10-CM | POA: Diagnosis not present

## 2022-03-21 DIAGNOSIS — F319 Bipolar disorder, unspecified: Secondary | ICD-10-CM

## 2022-03-21 DIAGNOSIS — E559 Vitamin D deficiency, unspecified: Secondary | ICD-10-CM

## 2022-03-21 DIAGNOSIS — S42351A Displaced comminuted fracture of shaft of humerus, right arm, initial encounter for closed fracture: Secondary | ICD-10-CM | POA: Diagnosis not present

## 2022-03-21 DIAGNOSIS — R7309 Other abnormal glucose: Secondary | ICD-10-CM

## 2022-03-21 DIAGNOSIS — Z5321 Procedure and treatment not carried out due to patient leaving prior to being seen by health care provider: Secondary | ICD-10-CM | POA: Insufficient documentation

## 2022-03-21 DIAGNOSIS — M25511 Pain in right shoulder: Secondary | ICD-10-CM | POA: Insufficient documentation

## 2022-03-21 DIAGNOSIS — E782 Mixed hyperlipidemia: Secondary | ICD-10-CM | POA: Diagnosis not present

## 2022-03-21 DIAGNOSIS — Z79899 Other long term (current) drug therapy: Secondary | ICD-10-CM | POA: Diagnosis not present

## 2022-03-21 DIAGNOSIS — I10 Essential (primary) hypertension: Secondary | ICD-10-CM

## 2022-03-21 DIAGNOSIS — R269 Unspecified abnormalities of gait and mobility: Secondary | ICD-10-CM | POA: Diagnosis not present

## 2022-03-21 DIAGNOSIS — S4991XA Unspecified injury of right shoulder and upper arm, initial encounter: Secondary | ICD-10-CM

## 2022-03-21 MED ORDER — VITAMIN B-12 1000 MCG SL SUBL: SUBLINGUAL_TABLET | SUBLINGUAL | 0 refills | Status: AC

## 2022-03-21 NOTE — ED Notes (Signed)
Pt was taken to xray

## 2022-03-21 NOTE — ED Triage Notes (Signed)
Pt is here for right shoulder pain since fall earlier this months.  Pt was seen by PCP but has not had xray and PCP advised to come here.

## 2022-03-22 ENCOUNTER — Other Ambulatory Visit: Payer: Self-pay | Admitting: Internal Medicine

## 2022-03-22 DIAGNOSIS — M25511 Pain in right shoulder: Secondary | ICD-10-CM | POA: Diagnosis not present

## 2022-03-22 DIAGNOSIS — N179 Acute kidney failure, unspecified: Secondary | ICD-10-CM

## 2022-03-22 DIAGNOSIS — R7989 Other specified abnormal findings of blood chemistry: Secondary | ICD-10-CM

## 2022-03-22 LAB — COMPLETE METABOLIC PANEL WITH GFR
AG Ratio: 1.5 (calc) (ref 1.0–2.5)
ALT: 16 U/L (ref 9–46)
AST: 18 U/L (ref 10–35)
Albumin: 4.3 g/dL (ref 3.6–5.1)
Alkaline phosphatase (APISO): 65 U/L (ref 35–144)
BUN/Creatinine Ratio: 15 (calc) (ref 6–22)
BUN: 25 mg/dL (ref 7–25)
CO2: 29 mmol/L (ref 20–32)
Calcium: 9.6 mg/dL (ref 8.6–10.3)
Chloride: 103 mmol/L (ref 98–110)
Creat: 1.63 mg/dL — ABNORMAL HIGH (ref 0.70–1.28)
Globulin: 2.8 g/dL (calc) (ref 1.9–3.7)
Glucose, Bld: 93 mg/dL (ref 65–99)
Potassium: 4.2 mmol/L (ref 3.5–5.3)
Sodium: 141 mmol/L (ref 135–146)
Total Bilirubin: 0.5 mg/dL (ref 0.2–1.2)
Total Protein: 7.1 g/dL (ref 6.1–8.1)
eGFR: 45 mL/min/{1.73_m2} — ABNORMAL LOW (ref >=60)

## 2022-03-22 LAB — LIPID PANEL
Cholesterol: 176 mg/dL (ref <200)
HDL: 60 mg/dL (ref >=40)
LDL Cholesterol (Calc): 93 mg/dL (calc)
Non-HDL Cholesterol (Calc): 116 mg/dL (calc) (ref <130)
Total CHOL/HDL Ratio: 2.9 (calc) (ref <5.0)
Triglycerides: 125 mg/dL (ref <150)

## 2022-03-22 LAB — HEMOGLOBIN A1C
Hgb A1c MFr Bld: 5.3 % of total Hgb (ref <5.7)
Mean Plasma Glucose: 105 mg/dL
eAG (mmol/L): 5.8 mmol/L

## 2022-03-22 LAB — CBC WITH DIFFERENTIAL/PLATELET
Absolute Monocytes: 888 cells/uL (ref 200–950)
Basophils Absolute: 33 cells/uL (ref 0–200)
Basophils Relative: 0.3 %
Eosinophils Absolute: 78 cells/uL (ref 15–500)
Eosinophils Relative: 0.7 %
HCT: 39.5 % (ref 38.5–50.0)
Hemoglobin: 13.2 g/dL (ref 13.2–17.1)
Lymphs Abs: 3419 cells/uL (ref 850–3900)
MCH: 30.8 pg (ref 27.0–33.0)
MCHC: 33.4 g/dL (ref 32.0–36.0)
MCV: 92.1 fL (ref 80.0–100.0)
MPV: 11.7 fL (ref 7.5–12.5)
Monocytes Relative: 8 %
Neutro Abs: 6682 cells/uL (ref 1500–7800)
Neutrophils Relative %: 60.2 %
Platelets: 264 10*3/uL (ref 140–400)
RBC: 4.29 10*6/uL (ref 4.20–5.80)
RDW: 12.7 % (ref 11.0–15.0)
Total Lymphocyte: 30.8 %
WBC: 11.1 10*3/uL — ABNORMAL HIGH (ref 3.8–10.8)

## 2022-03-22 LAB — MAGNESIUM: Magnesium: 2 mg/dL (ref 1.5–2.5)

## 2022-03-22 LAB — VITAMIN D 25 HYDROXY (VIT D DEFICIENCY, FRACTURES): Vit D, 25-Hydroxy: 101 ng/mL — ABNORMAL HIGH (ref 30–100)

## 2022-03-22 LAB — TSH: TSH: 0.23 mIU/L — ABNORMAL LOW (ref 0.40–4.50)

## 2022-03-22 LAB — VITAMIN B12: Vitamin B-12: 425 pg/mL (ref 200–1100)

## 2022-03-22 LAB — VALPROIC ACID LEVEL: Valproic Acid Lvl: 45.3 mg/L — ABNORMAL LOW (ref 50.0–100.0)

## 2022-03-22 LAB — INSULIN, RANDOM: Insulin: 12.3 u[IU]/mL

## 2022-03-22 NOTE — Progress Notes (Signed)
<><><><><><><><><><><><><><><><><><><><><><><><><><><><><><><><><> <><><><><><><><><><><><><><><><><><><><><><><><><><><><><><><><><>  -   Kidney functions look a little dehydrated   - Very important to drink adequate amounts of fluids to prevent permanent damage    - Recommend drink at least 6 bottles (16 ounces)                                                                            of fluids /water /day = 96 Oz ~100 oz    - 100 oz = 3,000 cc or 3 liters / day  - >>                                                        That's 1 &1/2 bottles of a 2 liter soda bottle /day !   <><><><><><><><><><><><><><><><><><><><><><><><><><><><><><><><><> <><><><><><><><><><><><><><><><><><><><><><><><><><><><><><><><><>  -  TSH is slightly low, which suggests Thyroid hormone is slightly high in blood, So  - Recommend schedule a Nurse visit in 1 month to                                       re-check kidney functions and  also re-check the thyroid test  <><><><><><><><><><><><><><><><><><><><><><><><><><><><><><><><><> <><><><><><><><><><><><><><><><><><><><><><><><><><><><><><><><><>  - Valproic acid   (Depakote)  level is Stable & OK  <><><><><><><><><><><><><><><><><><><><><><><><><><><><><><><><><>  - Total Chol = 176  & LDL Chol = 93   -     Both    Excellent   - Very low risk for Heart Attack  / Stroke <><><><><><><><><><><><><><><><><><><><><><><><><><><><><><><><><>  -  A1c - Normal - Great - No Diabetes  ! <><><><><><><><><><><><><><><><><><><><><><><><><><><><><><><><><>  -  -  Vitamin B12 =   425 is     STILL   Very Low  (Ideal or Goal Vit B12 is between 450 - 1,100)   Low Vit B12 may be associated with Anemia , Fatigue,   Peripheral Neuropathy, Dementia, "Brain Fog", & Depression  - Recommend take a sub-lingual form of Vitamin B12 tablet   1,000 to 5,000 mcg tab that you dissolve under your tongue /Daily   - Can get Baron Sane - best price at [employer] or on  [employer] <><><><><><><><><><><><><><><><><><><><><><><><><><><><><><><><><>  - All Else - CBC - Kidneys - Electrolytes - Liver - Magnesium & Thyroid    - all  Normal / OK <><><><><><><><><><><><><><><><><><><><><><><><><><><><><><><><><> <><><><><><><><><><><><><><><><><><><><><><><><><><><><><><><><><>

## 2022-04-13 DIAGNOSIS — M25511 Pain in right shoulder: Secondary | ICD-10-CM | POA: Diagnosis not present

## 2022-04-20 ENCOUNTER — Ambulatory Visit (INDEPENDENT_AMBULATORY_CARE_PROVIDER_SITE_OTHER): Payer: PPO | Admitting: Psychiatry

## 2022-04-20 DIAGNOSIS — Z635 Disruption of family by separation and divorce: Secondary | ICD-10-CM | POA: Diagnosis not present

## 2022-04-20 DIAGNOSIS — S42254S Nondisplaced fracture of greater tuberosity of right humerus, sequela: Secondary | ICD-10-CM | POA: Diagnosis not present

## 2022-04-20 DIAGNOSIS — F319 Bipolar disorder, unspecified: Secondary | ICD-10-CM

## 2022-04-20 NOTE — Progress Notes (Signed)
Psychotherapy Progress Note [facility] Timothy Moore, PhD LP  Patient ID: Timothy [name] [facility])    MRN: [id number] Therapy format: Individual psychotherapy Date: 04/20/2022      Start: 3:21p     Stop: 3:54p     Time Spent: 33 min Location: In-person   Session narrative (presenting needs, interim history, self-report of stressors and symptoms, applications of prior therapy, status changes, and interventions made in session) Since last seen, fell in kitchen and fractured shoulder.  Stable, recommended for reverse shoulder replacement surgery.  It has made sense not to accept Sharon's "offer" of coming up for December, and to politely refuse her throwing herself into caregiving for his new health need (i.e., the idea of taking him to her new home in [facility]).  Affirmed and encouraged in continuing clarity about boundaries andr refusing dissonance-making relations.    Clear right now that he means and needs to be off market for relationship.  Very thankful for family, and very much disinclined   In view of his Bipolar history, aware that getting romantically excited can be a mania trigger, feels he has been victim of that before and at this point disinclined to   Therapeutic modalities: Cognitive Behavioral Therapy, Solution-Oriented/Positive Psychology, and Ego-Supportive  Mental Status/Observations:  Appearance:   Casual     Behavior:  Appropriate  Motor:  As affected by spinal stenosis  Speech/Language:   Clear and Coherent  Affect:  Appropriate  Mood:  normal  Thought process:  normal  Thought content:    WNL  Sensory/Perceptual disturbances:    WNL  Orientation:  Fully oriented  Attention:  Good    Concentration:  Good  Memory:  WNL  Insight:    Fair  Judgment:   Good  Impulse Control:  Good   Risk Assessment: Danger to Self: No Self-injurious Behavior: No Danger to Others: No Physical Aggression / Violence: No Duty to Warn: No Access to Firearms a  concern: No  Assessment of progress:  progressing  Diagnosis:   ICD-10-CM   1. Bipolar I disorder ([place]) -- stable  F31.9     2. Separated from spouse  Z63.5      Plan:  Maintain clear separation as agreed good for both.  Decline any arrangements that create cognitive dissonance for either party, remain scrupulously honest about wishes/intentions/feelings in any further dealings, even if it may disappoint.  No pretending. Remain open to couples therapy if a clear and consenting change of heart Follow through engaging non-romantic relationships of choice Self-monitor for signs of mania, depression in need of further care Tend health habits Recommend do not indulge porn Other recommendations/advice as may be noted above Continue to utilize previously learned skills ad lib Maintain medication as prescribed and work faithfully with relevant prescriber(s) if any changes are desired or seem indicated Call the clinic on-call service, 988/hotline, 911, or present to [facility] or ER if any life-threatening psychiatric crisis Return for time at discretion, if needed (PRN). Already scheduled visit in this office Visit date not found.  Blanchie Serve, PhD Timothy Moore, PhD LP Clinical Psychologist, [facility] [address] [place], [place] [address] [phone]

## 2022-05-15 NOTE — Patient Instructions (Signed)
SURGICAL WAITING ROOM VISITATION Patients having surgery or a procedure may have no more than 2 support people in the waiting area - these visitors may rotate in the visitor waiting room.   Children under the age of 44 must have an adult with them who is not the patient. If the patient needs to stay at the hospital during part of their recovery, the visitor guidelines for inpatient rooms apply.  PRE-OP VISITATION  Pre-op nurse will coordinate an appropriate time for 1 support person to accompany the patient in pre-op.  This support person may not rotate.  This visitor will be contacted when the time is appropriate for the visitor to come back in the pre-op area.  Please refer to the [facility] website for the visitor guidelines for Inpatients (after your surgery is over and you are in a regular room).  You are not required to quarantine at this time prior to your surgery. However, you must do this: Hand Hygiene often Do NOT share personal items Notify your provider if you are in close contact with someone who has COVID or you develop fever 100.4 or greater, new onset of sneezing, cough, sore throat, shortness of breath or body aches.  If you test positive for Covid or have been in contact with anyone that has tested positive in the last 10 days please notify you surgeon.    Your procedure is scheduled on: Friday  May 27, 2022   Report to [facility] Main Entrance: [place] entrance where the [employer] is available.   Report to admitting at:  1:15  PM  +++++Call this number if you have any questions or problems the morning of surgery [phone]  Do not eat food after Midnight the night prior to your surgery/procedure.  After Midnight you may have the following liquids until  12:45 PM DAY OF SURGERY  Clear Liquid Diet Water Black Coffee (sugar ok, NO MILK/CREAM OR CREAMERS)  Tea (sugar ok, NO MILK/CREAM OR CREAMERS) regular and decaf                              Plain Jell-O  with no fruit (NO RED)                                           Fruit ices (not with fruit pulp, NO RED)                                     Popsicles (NO RED)                                                                  Juice: apple, WHITE grape, WHITE cranberry Sports drinks like Gatorade or Powerade (NO RED)                    The day of surgery:  Drink ONE (1) Pre-Surgery Clear Ensure  at 12:45 PM the morning of surgery. Drink in one sitting. Do not sip.  This drink was given to  you during your hospital pre-op appointment visit. Nothing else to drink after completing the Pre-Surgery Clear Ensure : No candy, chewing gum or throat lozenges.    FOLLOW ANY ADDITIONAL PRE OP INSTRUCTIONS YOU RECEIVED FROM YOUR SURGEON'S OFFICE!!!   Oral Hygiene is also important to reduce your risk of infection.        Remember - BRUSH YOUR TEETH THE MORNING OF SURGERY WITH YOUR REGULAR TOOTHPASTE  Take ONLY these medicines the morning of surgery with A SIP OF WATER: divalproex (Depakote), ???   If You have been diagnosed with Sleep Apnea - Bring CPAP mask and tubing day of surgery. We will provide you with a CPAP machine on the day of your surgery.                   You may not have any metal on your body including jewelry, and body piercing  Do not wear lotions, powders, cologne, or deodorant  Men may shave face and neck.  Contacts, Hearing Aids, dentures or bridgework may not be worn into surgery. DENTURES WILL BE REMOVED PRIOR TO SURGERY PLEASE DO NOT APPLY "Poly grip" OR ADHESIVES!!!  You may bring a small overnight bag with you on the day of surgery, only pack items that are not valuable. [facility] IS NOT RESPONSIBLE   FOR VALUABLES THAT ARE LOST OR STOLEN.   Do not bring your home medications to the hospital. The Pharmacy will dispense medications listed on your medication list to you during your admission in the Hospital.  Special Instructions: Bring a copy of your  healthcare power of attorney and living will documents the day of surgery, if you wish to have them scanned into your [facility]- EPIC  Please read over the following fact sheets you were given: IF YOU HAVE QUESTIONS ABOUT YOUR PRE-OP INSTRUCTIONS, PLEASE CALL [phone]  ([place])   [place] - Preparing for Surgery Before surgery, you can play an important role.  Because skin is not sterile, your skin needs to be as free of germs as possible.  You can reduce the number of germs on your skin by washing with CHG (chlorahexidine gluconate) soap before surgery.  CHG is an antiseptic cleaner which kills germs and bonds with the skin to continue killing germs even after washing. Please DO NOT use if you have an allergy to CHG or antibacterial soaps.  If your skin becomes reddened/irritated stop using the CHG and inform your nurse when you arrive at Short Stay. Do not shave (including legs and underarms) for at least 48 hours prior to the first CHG shower.  You may shave your face/neck.  Please follow these instructions carefully:  1.  Shower with CHG Soap the night before surgery and the  morning of surgery.  2.  If you choose to wash your hair, wash your hair first as usual with your normal  shampoo.  3.  After you shampoo, rinse your hair and body thoroughly to remove the shampoo.                             4.  Use CHG as you would any other liquid soap.  You can apply chg directly to the skin and wash.  Gently with a scrungie or clean washcloth.  5.  Apply the CHG Soap to your body ONLY FROM THE NECK DOWN.   Do not use on face/ open  Wound or open sores. Avoid contact with eyes, ears mouth and genitals (private parts).                       Wash face,  Genitals (private parts) with your normal soap.             6.  Wash thoroughly, paying special attention to the area where your  surgery  will be performed.  7.  Thoroughly rinse your body with warm water  from the neck down.  8.  DO NOT shower/wash with your normal soap after using and rinsing off the CHG Soap.            9.  Pat yourself dry with a clean towel.            10.  Wear clean pajamas.            11.  Place clean sheets on your bed the night of your first shower and do not  sleep with pets.  ON THE DAY OF SURGERY : Do not apply any lotions/deodorants the morning of surgery.  Please wear clean clothes to the hospital/surgery center.   Preparing for Total Shoulder Arthroplasty ================================================================= Please follow these instructions carefully, in addition to any other special Bathing information that was explained to you at the Presurgical Appointment:  BENZOYL PEROXIDE 5% GEL: Used to kill bacteria on the skin which could cause an infection at the surgery site.   Please do not use if you have an allergy to benzoyl peroxide. If your skin becomes reddened/irritated stop using the benzoyl peroxide and inform your Doctor.   Starting two days before surgery, apply as follows:  1. Apply benzoyl peroxide gel in the morning and at night. Apply after taking a shower. If you are not taking a shower, clean entire shoulder front, back, and side, along with the armpit with a clean wet washcloth.  2. Place a quarter-sized dollop of the gel on your SHOULDER and rub in thoroughly, making sure to cover the front, back, and side of your shoulder, along with the armpit.   2 Days prior to Surgery    Wednesday  May 25, 2022  First Dose  _______ Morning Second Dose  _______ Night  Day Before Surgery            Thursday  May 26, 2022 First Dose  ______ Morning  On the night before surgery, wash your entire body (except hair, face and private areas) with CHG Soap. THEN, rub in the LAST application of the Benzoyl Peroxide Gel on your shoulder.   3. On the Morning of Surgery wash your BODY AGAIN with CHG Soap (except hair, face and private  areas)  4. DO NOT USE THE BENZOYL PEROXIDE GEL ON THE DAY OF YOUR SURGERY       FAILURE TO FOLLOW THESE INSTRUCTIONS MAY RESULT IN THE CANCELLATION OF YOUR SURGERY  PATIENT SIGNATURE_________________________________  NURSE SIGNATURE__________________________________  ________________________________________________________________________       Adam Phenix    An incentive spirometer is a tool that can help keep your lungs clear and active. This tool measures how well you are filling your lungs with each breath. Taking long deep breaths may help reverse or decrease the chance of developing breathing (pulmonary) problems (especially infection) following: A long period of time when you are unable to move or be active. BEFORE THE PROCEDURE  If the spirometer includes an indicator to show your best effort, your nurse  or respiratory therapist will set it to a desired goal. If possible, sit up straight or lean slightly forward. Try not to slouch. Hold the incentive spirometer in an upright position. INSTRUCTIONS FOR USE  Sit on the edge of your bed if possible, or sit up as far as you can in bed or on a chair. Hold the incentive spirometer in an upright position. Breathe out normally. Place the mouthpiece in your mouth and seal your lips tightly around it. Breathe in slowly and as deeply as possible, raising the piston or the ball toward the top of the column. Hold your breath for 3-5 seconds or for as long as possible. Allow the piston or ball to fall to the bottom of the column. Remove the mouthpiece from your mouth and breathe out normally. Rest for a few seconds and repeat Steps 1 through 7 at least 10 times every 1-2 hours when you are awake. Take your time and take a few normal breaths between deep breaths. The spirometer may include an indicator to show your best effort. Use the indicator as a goal to work toward during each repetition. After each set of 10 deep  breaths, practice coughing to be sure your lungs are clear. If you have an incision (the cut made at the time of surgery), support your incision when coughing by placing a pillow or rolled up towels firmly against it. Once you are able to get out of bed, walk around indoors and cough well. You may stop using the incentive spirometer when instructed by your caregiver.  RISKS AND COMPLICATIONS Take your time so you do not get dizzy or light-headed. If you are in pain, you may need to take or ask for pain medication before doing incentive spirometry. It is harder to take a deep breath if you are having pain. AFTER USE Rest and breathe slowly and easily. It can be helpful to keep track of a log of your progress. Your caregiver can provide you with a simple table to help with this. If you are using the spirometer at home, follow these instructions: [place] IF:  You are having difficultly using the spirometer. You have trouble using the spirometer as often as instructed. Your pain medication is not giving enough relief while using the spirometer. You develop fever of 100.5 F (38.1 C) or higher.                                                                                                    SEEK IMMEDIATE MEDICAL CARE IF:  You cough up bloody sputum that had not been present before. You develop fever of 102 F (38.9 C) or greater. You develop worsening pain at or near the incision site. MAKE SURE YOU:  Understand these instructions. Will watch your condition. Will get help right away if you are not doing well or get worse. Document Released: 09/26/2006 Document Revised: 08/08/2011 Document Reviewed: 11/27/2006 [facility] Patient Information 2014 [place], [place].

## 2022-05-15 NOTE — Progress Notes (Addendum)
COVID Vaccine received:  _0  No _1  Yes Date of any COVID positive Test in last 90 days:  PCP - [name], MD Cardiologist - None Neurology- Metta Clines, MD  Chest x-ray -  EKG - 07-27-2021  Stress Test -  ECHO -  Cardiac Cath -   PCR screen: _2  Ordered & Completed                      _3   No Order but Needs PROFEND                      _4   N/A for this surgery  Surgery Plan:  _5  Ambulatory                            _6  Outpatient in bed                            _7  Admit  Anesthesia:    _8  General  _9  Spinal                           _10   Choice _11   MAC  Pacemaker / ICD device _12  No _13  Yes        Device order form faxed _14  No    _15   Yes      Faxed to:  Spinal Cord Stimulator:_16  No _17  Yes      (Remind patient to bring remote DOS) Other Implants:   History of Sleep Apnea? _18  No _19  Yes   CPAP used?- _20  No _21  Yes    Does the patient monitor blood sugar? _22  No _23  Yes  _24  N/A Does patient have a [employer] or Dexacom? _25  No _26  Yes   Fasting Blood Sugar Ranges-  Checks Blood Sugar _____ times a day  Blood Thinner / Instructions: none Aspirin Instructions:  ERAS Protocol Ordered: _27  No  _28  Yes PRE-SURGERY _29  ENSURE  _30  G2   Patient is to be NPO after: 12:45 pm  Comments:   Activity level: Patient can / can not climb a flight of stairs without difficulty; _31  No CP  _32  No SOB, but would have ______   Patient can / can not perform ADLs without assistance.   Anesthesia review: OSA (CPAP), HTN, Bipolar 1, CKD3  Patient denies shortness of breath, fever, cough and chest pain at PAT appointment.  Patient verbalized understanding and agreement to the Pre-Surgical Instructions that were given to them at this PAT appointment. Patient was also educated of the need to review these PAT instructions again prior to his/her surgery.I reviewed the appropriate phone numbers to call if they have any and questions or concerns.

## 2022-05-18 ENCOUNTER — Encounter (HOSPITAL_COMMUNITY)
Admission: RE | Admit: 2022-05-18 | Discharge: 2022-05-18 | Disposition: A | Discharge status: Home / Self Care | Payer: PPO | Source: Ambulatory Visit | Attending: Orthopedic Surgery | Admitting: Orthopedic Surgery

## 2022-05-18 ENCOUNTER — Other Ambulatory Visit: Payer: Self-pay

## 2022-05-18 ENCOUNTER — Encounter (HOSPITAL_COMMUNITY): Payer: Self-pay

## 2022-05-18 VITALS — BP 120/76 | HR 64 | Temp 97.8°F | Resp 14 | Ht 69.0 in | Wt 164.0 lb

## 2022-05-18 DIAGNOSIS — Z01818 Encounter for other preprocedural examination: Secondary | ICD-10-CM

## 2022-05-18 DIAGNOSIS — I10 Essential (primary) hypertension: Secondary | ICD-10-CM | POA: Diagnosis not present

## 2022-05-18 DIAGNOSIS — Z01812 Encounter for preprocedural laboratory examination: Secondary | ICD-10-CM | POA: Diagnosis not present

## 2022-05-18 HISTORY — DX: Malignant (primary) neoplasm, unspecified: C80.1

## 2022-05-18 HISTORY — DX: Myoneural disorder, unspecified: G70.9

## 2022-05-18 LAB — CBC
HCT: 40.9 % (ref 39.0–52.0)
Hemoglobin: 13.1 g/dL (ref 13.0–17.0)
MCH: 29.5 pg (ref 26.0–34.0)
MCHC: 32 g/dL (ref 30.0–36.0)
MCV: 92.1 fL (ref 80.0–100.0)
Platelets: 238 10*3/uL (ref 150–400)
RBC: 4.44 MIL/uL (ref 4.22–5.81)
RDW: 13.2 % (ref 11.5–15.5)
WBC: 10.2 10*3/uL (ref 4.0–10.5)
nRBC: 0 % (ref 0.0–0.2)

## 2022-05-18 LAB — BASIC METABOLIC PANEL
Anion gap: 7 (ref 5–15)
BUN: 25 mg/dL — ABNORMAL HIGH (ref 8–23)
CO2: 27 mmol/L (ref 22–32)
Calcium: 9.3 mg/dL (ref 8.9–10.3)
Chloride: 106 mmol/L (ref 98–111)
Creatinine, Ser: 0.89 mg/dL (ref 0.61–1.24)
GFR, Estimated: >60 mL/min (ref >60)
Glucose, Bld: 98 mg/dL (ref 70–99)
Potassium: 4.1 mmol/L (ref 3.5–5.1)
Sodium: 140 mmol/L (ref 135–145)

## 2022-05-18 LAB — SURGICAL PCR SCREEN
MRSA, PCR: NEGATIVE
Staphylococcus aureus: NEGATIVE

## 2022-05-27 ENCOUNTER — Observation Stay (HOSPITAL_COMMUNITY): Payer: PPO

## 2022-05-27 ENCOUNTER — Ambulatory Visit (HOSPITAL_BASED_OUTPATIENT_CLINIC_OR_DEPARTMENT_OTHER): Payer: PPO | Admitting: Anesthesiology

## 2022-05-27 ENCOUNTER — Encounter (HOSPITAL_COMMUNITY): Payer: Self-pay | Admitting: Orthopedic Surgery

## 2022-05-27 ENCOUNTER — Other Ambulatory Visit: Payer: Self-pay

## 2022-05-27 ENCOUNTER — Observation Stay (HOSPITAL_COMMUNITY)
Admission: RE | Admit: 2022-05-27 | Discharge: 2022-05-28 | Disposition: A | Discharge status: Home / Self Care | Payer: PPO | Attending: Orthopedic Surgery | Admitting: Orthopedic Surgery

## 2022-05-27 ENCOUNTER — Encounter (HOSPITAL_COMMUNITY)
Admission: RE | Disposition: A | Discharge status: Home / Self Care | Payer: Self-pay | Source: Home / Self Care | Attending: Orthopedic Surgery

## 2022-05-27 ENCOUNTER — Ambulatory Visit (HOSPITAL_COMMUNITY): Payer: PPO | Admitting: Anesthesiology

## 2022-05-27 DIAGNOSIS — Z96642 Presence of left artificial hip joint: Secondary | ICD-10-CM | POA: Insufficient documentation

## 2022-05-27 DIAGNOSIS — Z471 Aftercare following joint replacement surgery: Secondary | ICD-10-CM | POA: Diagnosis not present

## 2022-05-27 DIAGNOSIS — Z85828 Personal history of other malignant neoplasm of skin: Secondary | ICD-10-CM | POA: Insufficient documentation

## 2022-05-27 DIAGNOSIS — X58XXXA Exposure to other specified factors, initial encounter: Secondary | ICD-10-CM | POA: Insufficient documentation

## 2022-05-27 DIAGNOSIS — M75101 Unspecified rotator cuff tear or rupture of right shoulder, not specified as traumatic: Secondary | ICD-10-CM

## 2022-05-27 DIAGNOSIS — M19011 Primary osteoarthritis, right shoulder: Secondary | ICD-10-CM | POA: Diagnosis not present

## 2022-05-27 DIAGNOSIS — M12811 Other specific arthropathies, not elsewhere classified, right shoulder: Secondary | ICD-10-CM | POA: Insufficient documentation

## 2022-05-27 DIAGNOSIS — S42254P Nondisplaced fracture of greater tuberosity of right humerus, subsequent encounter for fracture with malunion: Principal | ICD-10-CM | POA: Insufficient documentation

## 2022-05-27 DIAGNOSIS — Z7982 Long term (current) use of aspirin: Secondary | ICD-10-CM | POA: Insufficient documentation

## 2022-05-27 DIAGNOSIS — Z79899 Other long term (current) drug therapy: Secondary | ICD-10-CM | POA: Diagnosis not present

## 2022-05-27 DIAGNOSIS — Z96611 Presence of right artificial shoulder joint: Secondary | ICD-10-CM | POA: Diagnosis not present

## 2022-05-27 DIAGNOSIS — G8918 Other acute postprocedural pain: Secondary | ICD-10-CM | POA: Diagnosis not present

## 2022-05-27 DIAGNOSIS — S42254D Nondisplaced fracture of greater tuberosity of right humerus, subsequent encounter for fracture with routine healing: Secondary | ICD-10-CM | POA: Diagnosis not present

## 2022-05-27 HISTORY — PX: REVERSE SHOULDER ARTHROPLASTY: SHX5054

## 2022-05-27 SURGERY — ARTHROPLASTY, SHOULDER, TOTAL, REVERSE
Anesthesia: General | Site: Shoulder | Laterality: Right

## 2022-05-27 MED ORDER — ASPIRIN 81 MG PO TBEC
81.0000 mg | DELAYED_RELEASE_TABLET | Freq: Every day | ORAL | Status: DC
  Administered 2022-05-28: 81 mg via ORAL
  Filled 2022-05-27: qty 1

## 2022-05-27 MED ORDER — METOCLOPRAMIDE HCL 5 MG PO TABS: 5.0000 mg | ORAL_TABLET | Freq: Three times a day (TID) | ORAL | Status: DC | PRN

## 2022-05-27 MED ORDER — CHLORHEXIDINE GLUCONATE 0.12 % MT SOLN
15.0000 mL | Freq: Once | OROMUCOSAL | Status: AC
  Administered 2022-05-27: 15 mL via OROMUCOSAL

## 2022-05-27 MED ORDER — MIDAZOLAM HCL 5 MG/5ML IJ SOLN
INTRAMUSCULAR | Status: DC | PRN
  Administered 2022-05-27: 2 mg via INTRAVENOUS

## 2022-05-27 MED ORDER — OXYCODONE HCL 5 MG/5ML PO SOLN: 5.0000 mg | Freq: Once | ORAL | Status: DC | PRN

## 2022-05-27 MED ORDER — DOCUSATE SODIUM 100 MG PO CAPS
100.0000 mg | ORAL_CAPSULE | Freq: Two times a day (BID) | ORAL | Status: DC
  Administered 2022-05-27 – 2022-05-28 (×2): 100 mg via ORAL
  Filled 2022-05-27 (×2): qty 1

## 2022-05-27 MED ORDER — ONDANSETRON HCL 4 MG/2ML IJ SOLN: 4.0000 mg | Freq: Four times a day (QID) | INTRAMUSCULAR | Status: DC | PRN

## 2022-05-27 MED ORDER — PHENOL 1.4 % MT LIQD: 1.0000 | OROMUCOSAL | Status: DC | PRN

## 2022-05-27 MED ORDER — VANCOMYCIN HCL 1000 MG IV SOLR
INTRAVENOUS | Status: DC | PRN
  Administered 2022-05-27: 1000 mg via TOPICAL

## 2022-05-27 MED ORDER — FENTANYL CITRATE (PF) 100 MCG/2ML IJ SOLN
INTRAMUSCULAR | Status: DC | PRN
  Administered 2022-05-27: 50 ug via INTRAVENOUS

## 2022-05-27 MED ORDER — AMISULPRIDE (ANTIEMETIC) 5 MG/2ML IV SOLN: 10.0000 mg | Freq: Once | INTRAVENOUS | Status: DC | PRN

## 2022-05-27 MED ORDER — FENTANYL CITRATE PF 50 MCG/ML IJ SOSY
50.0000 ug | PREFILLED_SYRINGE | INTRAMUSCULAR | Status: AC
  Administered 2022-05-27: 50 ug via INTRAVENOUS
  Filled 2022-05-27: qty 2

## 2022-05-27 MED ORDER — METOCLOPRAMIDE HCL 5 MG/ML IJ SOLN: 5.0000 mg | Freq: Three times a day (TID) | INTRAMUSCULAR | Status: DC | PRN

## 2022-05-27 MED ORDER — VITAMIN B-12 1000 MCG PO TABS
1000.0000 ug | ORAL_TABLET | Freq: Every day | ORAL | Status: DC
  Filled 2022-05-27: qty 1

## 2022-05-27 MED ORDER — PROPOFOL 10 MG/ML IV BOLUS
INTRAVENOUS | Status: DC | PRN
  Administered 2022-05-27: 150 mg via INTRAVENOUS

## 2022-05-27 MED ORDER — ONDANSETRON HCL 4 MG PO TABS: 4.0000 mg | ORAL_TABLET | Freq: Three times a day (TID) | ORAL | 0 refills | Status: DC | PRN

## 2022-05-27 MED ORDER — DIVALPROEX SODIUM 250 MG PO DR TAB
500.0000 mg | DELAYED_RELEASE_TABLET | Freq: Two times a day (BID) | ORAL | Status: DC
  Administered 2022-05-27 – 2022-05-28 (×2): 500 mg via ORAL
  Filled 2022-05-27 (×2): qty 2

## 2022-05-27 MED ORDER — CEFAZOLIN SODIUM-DEXTROSE 2-4 GM/100ML-% IV SOLN
2.0000 g | INTRAVENOUS | Status: AC
  Administered 2022-05-27: 2 g via INTRAVENOUS
  Filled 2022-05-27: qty 100

## 2022-05-27 MED ORDER — ONDANSETRON HCL 4 MG PO TABS: 4.0000 mg | ORAL_TABLET | Freq: Four times a day (QID) | ORAL | Status: DC | PRN

## 2022-05-27 MED ORDER — PHENYLEPHRINE HCL-NACL 20-0.9 MG/250ML-% IV SOLN
INTRAVENOUS | Status: DC | PRN
  Administered 2022-05-27: 40 ug/min via INTRAVENOUS

## 2022-05-27 MED ORDER — LACTATED RINGERS IV SOLN: INTRAVENOUS | Status: DC

## 2022-05-27 MED ORDER — MIDAZOLAM HCL 2 MG/2ML IJ SOLN
INTRAMUSCULAR | Status: AC
  Filled 2022-05-27: qty 2

## 2022-05-27 MED ORDER — BUPIVACAINE HCL (PF) 0.5 % IJ SOLN
INTRAMUSCULAR | Status: DC | PRN
  Administered 2022-05-27: 15 mL via PERINEURAL

## 2022-05-27 MED ORDER — PHENYLEPHRINE HCL (PRESSORS) 10 MG/ML IV SOLN
INTRAVENOUS | Status: AC
  Filled 2022-05-27: qty 1

## 2022-05-27 MED ORDER — DEXAMETHASONE SODIUM PHOSPHATE 4 MG/ML IJ SOLN
INTRAMUSCULAR | Status: DC | PRN
  Administered 2022-05-27: 8 mg via INTRAVENOUS

## 2022-05-27 MED ORDER — PROPOFOL 10 MG/ML IV BOLUS
INTRAVENOUS | Status: AC
  Filled 2022-05-27: qty 20

## 2022-05-27 MED ORDER — PHENYLEPHRINE 80 MCG/ML (10ML) SYRINGE FOR IV PUSH (FOR BLOOD PRESSURE SUPPORT)
PREFILLED_SYRINGE | INTRAVENOUS | Status: DC | PRN
  Administered 2022-05-27 (×4): 80 ug via INTRAVENOUS
  Administered 2022-05-27: 160 ug via INTRAVENOUS
  Administered 2022-05-27: 80 ug via INTRAVENOUS

## 2022-05-27 MED ORDER — VITAMIN B-12 1000 MCG SL SUBL: 1000.0000 ug | SUBLINGUAL_TABLET | Freq: Every day | SUBLINGUAL | Status: DC

## 2022-05-27 MED ORDER — TRANEXAMIC ACID-NACL 1000-0.7 MG/100ML-% IV SOLN
1000.0000 mg | INTRAVENOUS | Status: AC
  Administered 2022-05-27: 1000 mg via INTRAVENOUS
  Filled 2022-05-27: qty 100

## 2022-05-27 MED ORDER — HYDROCODONE-ACETAMINOPHEN 5-325 MG PO TABS
1.0000 | ORAL_TABLET | ORAL | Status: DC | PRN
  Administered 2022-05-28: 1 via ORAL
  Filled 2022-05-27: qty 1

## 2022-05-27 MED ORDER — ONDANSETRON HCL 4 MG/2ML IJ SOLN
INTRAMUSCULAR | Status: DC | PRN
  Administered 2022-05-27: 4 mg via INTRAVENOUS

## 2022-05-27 MED ORDER — ROCURONIUM BROMIDE 10 MG/ML (PF) SYRINGE
PREFILLED_SYRINGE | INTRAVENOUS | Status: AC
  Filled 2022-05-27: qty 10

## 2022-05-27 MED ORDER — DEXAMETHASONE SODIUM PHOSPHATE 10 MG/ML IJ SOLN
INTRAMUSCULAR | Status: AC
  Filled 2022-05-27: qty 1

## 2022-05-27 MED ORDER — ONDANSETRON HCL 4 MG/2ML IJ SOLN
INTRAMUSCULAR | Status: AC
  Filled 2022-05-27: qty 2

## 2022-05-27 MED ORDER — MIDAZOLAM HCL 2 MG/2ML IJ SOLN
1.0000 mg | INTRAMUSCULAR | Status: AC
  Administered 2022-05-27: 2 mg via INTRAVENOUS
  Filled 2022-05-27: qty 2

## 2022-05-27 MED ORDER — VANCOMYCIN HCL 1000 MG IV SOLR
INTRAVENOUS | Status: AC
  Filled 2022-05-27: qty 20

## 2022-05-27 MED ORDER — OXYCODONE HCL 5 MG PO TABS: 5.0000 mg | ORAL_TABLET | Freq: Once | ORAL | Status: DC | PRN

## 2022-05-27 MED ORDER — ACETAMINOPHEN 500 MG PO TABS
1000.0000 mg | ORAL_TABLET | Freq: Once | ORAL | Status: AC
  Administered 2022-05-27: 1000 mg via ORAL
  Filled 2022-05-27: qty 2

## 2022-05-27 MED ORDER — HYDROCODONE-ACETAMINOPHEN 7.5-325 MG PO TABS: 1.0000 | ORAL_TABLET | ORAL | Status: DC | PRN

## 2022-05-27 MED ORDER — STERILE WATER FOR IRRIGATION IR SOLN
Status: DC | PRN
  Administered 2022-05-27: 2000 mL

## 2022-05-27 MED ORDER — ACETAMINOPHEN 325 MG PO TABS: 325.0000 mg | ORAL_TABLET | Freq: Four times a day (QID) | ORAL | Status: DC | PRN

## 2022-05-27 MED ORDER — ONDANSETRON HCL 4 MG/2ML IJ SOLN: 4.0000 mg | Freq: Once | INTRAMUSCULAR | Status: DC | PRN

## 2022-05-27 MED ORDER — ACETAMINOPHEN 500 MG PO TABS
500.0000 mg | ORAL_TABLET | Freq: Four times a day (QID) | ORAL | Status: AC
  Administered 2022-05-27 – 2022-05-28 (×4): 500 mg via ORAL
  Filled 2022-05-27 (×4): qty 1

## 2022-05-27 MED ORDER — FENTANYL CITRATE (PF) 100 MCG/2ML IJ SOLN
INTRAMUSCULAR | Status: AC
  Filled 2022-05-27: qty 2

## 2022-05-27 MED ORDER — OXYCODONE HCL 5 MG PO TABS: 5.0000 mg | ORAL_TABLET | Freq: Four times a day (QID) | ORAL | 0 refills | Status: DC | PRN

## 2022-05-27 MED ORDER — BISOPROLOL-HYDROCHLOROTHIAZIDE 5-6.25 MG PO TABS: 1.0000 | ORAL_TABLET | Freq: Every day | ORAL | Status: DC

## 2022-05-27 MED ORDER — TRANEXAMIC ACID-NACL 1000-0.7 MG/100ML-% IV SOLN
1000.0000 mg | Freq: Once | INTRAVENOUS | Status: AC
  Administered 2022-05-27: 1000 mg via INTRAVENOUS
  Filled 2022-05-27: qty 100

## 2022-05-27 MED ORDER — ROCURONIUM BROMIDE 10 MG/ML (PF) SYRINGE
PREFILLED_SYRINGE | INTRAVENOUS | Status: DC | PRN
  Administered 2022-05-27: 70 mg via INTRAVENOUS
  Administered 2022-05-27 (×2): 10 mg via INTRAVENOUS

## 2022-05-27 MED ORDER — BUPIVACAINE LIPOSOME 1.3 % IJ SUSP
INTRAMUSCULAR | Status: DC | PRN
  Administered 2022-05-27: 10 mL via PERINEURAL

## 2022-05-27 MED ORDER — CEFAZOLIN SODIUM-DEXTROSE 1-4 GM/50ML-% IV SOLN
1.0000 g | Freq: Four times a day (QID) | INTRAVENOUS | Status: AC
  Administered 2022-05-27 – 2022-05-28 (×3): 1 g via INTRAVENOUS
  Filled 2022-05-27 (×3): qty 50

## 2022-05-27 MED ORDER — HYDROCHLOROTHIAZIDE 12.5 MG PO TABS
6.2500 mg | ORAL_TABLET | Freq: Every day | ORAL | Status: DC
  Filled 2022-05-27: qty 1

## 2022-05-27 MED ORDER — VITAMIN D 25 MCG (1000 UNIT) PO TABS: 5000.0000 [IU] | ORAL_TABLET | Freq: Every evening | ORAL | Status: DC

## 2022-05-27 MED ORDER — ORAL CARE MOUTH RINSE: 15.0000 mL | Freq: Once | OROMUCOSAL | Status: AC

## 2022-05-27 MED ORDER — FENTANYL CITRATE PF 50 MCG/ML IJ SOSY: 25.0000 ug | PREFILLED_SYRINGE | INTRAMUSCULAR | Status: DC | PRN

## 2022-05-27 MED ORDER — MENTHOL 3 MG MT LOZG: 1.0000 | LOZENGE | OROMUCOSAL | Status: DC | PRN

## 2022-05-27 MED ORDER — ORAL CARE MOUTH RINSE: 15.0000 mL | OROMUCOSAL | Status: DC | PRN

## 2022-05-27 MED ORDER — SUGAMMADEX SODIUM 200 MG/2ML IV SOLN
INTRAVENOUS | Status: DC | PRN
  Administered 2022-05-27: 200 mg via INTRAVENOUS

## 2022-05-27 MED ORDER — LIDOCAINE 2% (20 MG/ML) 5 ML SYRINGE
INTRAMUSCULAR | Status: DC | PRN
  Administered 2022-05-27: 60 mg via INTRAVENOUS

## 2022-05-27 MED ORDER — 0.9 % SODIUM CHLORIDE (POUR BTL) OPTIME
TOPICAL | Status: DC | PRN
  Administered 2022-05-27: 1000 mL

## 2022-05-27 MED ORDER — LIDOCAINE HCL (PF) 2 % IJ SOLN
INTRAMUSCULAR | Status: AC
  Filled 2022-05-27: qty 5

## 2022-05-27 MED ORDER — MORPHINE SULFATE (PF) 2 MG/ML IV SOLN: 0.5000 mg | INTRAVENOUS | Status: DC | PRN

## 2022-05-27 MED ORDER — BISOPROLOL FUMARATE 5 MG PO TABS
5.0000 mg | ORAL_TABLET | Freq: Every day | ORAL | Status: DC
  Filled 2022-05-27: qty 1

## 2022-05-27 SURGICAL SUPPLY — 66 items
AID PSTN UNV HD RSTRNT DISP (MISCELLANEOUS)
BAG COUNTER SPONGE SURGICOUNT (BAG) IMPLANT
BAG ZIPLOCK 12X15 (MISCELLANEOUS) ×1 IMPLANT
BASEPLATE GLENOSPHERE 25 (Plate) IMPLANT
BEARING HUMERAL 40 STD VITE (Joint) IMPLANT
BIT DRILL TWIST 2.7 (BIT) IMPLANT
BLADE SAG 18X100X1.27 (BLADE) ×1 IMPLANT
BRNG HUM STD 40 RVRS SHLDR (Joint) ×1 IMPLANT
CEMENT BONE DEPUY (Cement) IMPLANT
CLSR STERI-STRIP ANTIMIC 1/2X4 (GAUZE/BANDAGES/DRESSINGS) IMPLANT
COVER BACK TABLE 60X90IN (DRAPES) ×1 IMPLANT
COVER SURGICAL LIGHT HANDLE (MISCELLANEOUS) ×1 IMPLANT
DRAPE ORTHO SPLIT 77X108 STRL (DRAPES) ×2
DRAPE SHEET LG 3/4 BI-LAMINATE (DRAPES) ×1 IMPLANT
DRAPE SURG 17X11 SM STRL (DRAPES) ×1 IMPLANT
DRAPE SURG ORHT 6 SPLT 77X108 (DRAPES) ×2 IMPLANT
DRAPE TOP 10253 STERILE (DRAPES) ×1 IMPLANT
DRAPE U-SHAPE 47X51 STRL (DRAPES) ×1 IMPLANT
DRSG AQUACEL AG ADV 3.5X 6 (GAUZE/BANDAGES/DRESSINGS) IMPLANT
DRSG AQUACEL AG ADV 3.5X10 (GAUZE/BANDAGES/DRESSINGS) ×1 IMPLANT
DURAPREP 26ML APPLICATOR (WOUND CARE) IMPLANT
ELECT REM PT RETURN 15FT ADLT (MISCELLANEOUS) ×1 IMPLANT
FACESHIELD WRAPAROUND (MASK) ×1 IMPLANT
FACESHIELD WRAPAROUND OR TEAM (MASK) ×1 IMPLANT
GLENOSPHERE VERSADIAL 40 +3 (Joint) IMPLANT
GLOVE BIO SURGEON STRL SZ7.5 (GLOVE) ×4 IMPLANT
GLOVE BIOGEL PI IND STRL 8 (GLOVE) ×2 IMPLANT
GOWN STRL REUS W/ TWL XL LVL3 (GOWN DISPOSABLE) ×2 IMPLANT
GOWN STRL REUS W/TWL XL LVL3 (GOWN DISPOSABLE) ×2
KIT BASIN OR (CUSTOM PROCEDURE TRAY) ×1 IMPLANT
KIT TURNOVER KIT A (KITS) IMPLANT
MANIFOLD NEPTUNE II (INSTRUMENTS) ×1 IMPLANT
NDL TAPERED W/ NITINOL LOOP (MISCELLANEOUS) IMPLANT
NEEDLE TAPERED W/ NITINOL LOOP (MISCELLANEOUS) IMPLANT
NS IRRIG 1000ML POUR BTL (IV SOLUTION) ×1 IMPLANT
PACK SHOULDER (CUSTOM PROCEDURE TRAY) ×1 IMPLANT
PIN THREADED REVERSE (PIN) IMPLANT
PROTECTOR NERVE ULNAR (MISCELLANEOUS) ×1 IMPLANT
RESTRAINT HEAD UNIVERSAL NS (MISCELLANEOUS) IMPLANT
SCREW BONE LOCKING 4.75X35X3.5 (Screw) IMPLANT
SCREW BONE LOCKING 4.75X40X3.5 (Screw) IMPLANT
SCREW CENTRAL 6.5X40 (Screw) IMPLANT
SCREW LOCKING 4.75MMX15MM (Screw) IMPLANT
SLING ARM FOAM STRAP LRG (SOFTGOODS) IMPLANT
SLING ARM FOAM STRAP MED (SOFTGOODS) IMPLANT
SMARTMIX MINI TOWER (MISCELLANEOUS)
SPONGE T-LAP 4X18 ~~LOC~~+RFID (SPONGE) IMPLANT
STEM HUM STD SHLD  9 (Stem) ×1 IMPLANT
STEM HUM STD SHLD 9 (Stem) IMPLANT
STRIP CLOSURE SKIN 1/2X4 (GAUZE/BANDAGES/DRESSINGS) ×1 IMPLANT
SUCTION FRAZIER HANDLE 10FR (MISCELLANEOUS) ×1
SUCTION TUBE FRAZIER 10FR DISP (MISCELLANEOUS) ×1 IMPLANT
SUT FIBERWIRE #2 38 T-5 BLUE (SUTURE)
SUT MAXBRAID #2 CVD NDL (SUTURE) IMPLANT
SUT MNCRL AB 3-0 PS2 18 (SUTURE) ×1 IMPLANT
SUT VIC AB 0 CT1 36 (SUTURE) ×1 IMPLANT
SUT VIC AB 1 CT1 36 (SUTURE) ×1 IMPLANT
SUT VIC AB 2-0 CT1 27 (SUTURE) ×1
SUT VIC AB 2-0 CT1 TAPERPNT 27 (SUTURE) ×1 IMPLANT
SUTURE FIBERWR #2 38 T-5 BLUE (SUTURE) IMPLANT
SUTURE TAPE 1.3 40 TPR END (SUTURE) ×2 IMPLANT
SUTURETAPE 1.3 40 TPR END (SUTURE) ×2
TOWEL OR 17X26 10 PK STRL BLUE (TOWEL DISPOSABLE) ×1 IMPLANT
TOWER SMARTMIX MINI (MISCELLANEOUS) IMPLANT
TRAY HUMERAL NEUTRAL EXT 6 (Shoulder) IMPLANT
TUBE SUCTION HIGH CAP CLEAR NV (SUCTIONS) ×1 IMPLANT

## 2022-05-27 NOTE — Anesthesia Procedure Notes (Signed)
Anesthesia Regional Block: Interscalene brachial plexus block   Pre-Anesthetic Checklist: , timeout performed,  Correct Patient, Correct Site, Correct Laterality,  Correct Procedure, Correct Position, site marked,  Risks and benefits discussed,  Surgical consent,  Pre-op evaluation,  At surgeon's request and post-op pain management  Laterality: Right  Prep: chloraprep       Needles:  Injection technique: Single-shot  Needle Type: Echogenic Stimulator Needle     Needle Length: 10cm  Needle Gauge: 20     Additional Needles:   Procedures:,,,, ultrasound used (permanent image in chart),,    Narrative:  Start time: 05/27/2022 12:43 PM End time: 05/27/2022 12:47 PM Injection made incrementally with aspirations every 5 mL.  Performed by: Personally  Anesthesiologist: Lidia Collum, MD  Additional Notes: Standard monitors applied. Skin prepped. Good needle visualization with ultrasound. Injection made in 5cc increments with no resistance to injection. Patient tolerated the procedure well.

## 2022-05-27 NOTE — Anesthesia Preprocedure Evaluation (Addendum)
Anesthesia Evaluation  Patient identified by MRN, date of birth, ID band Patient awake    Reviewed: Allergy & Precautions, NPO status , Patient's Chart, lab work & pertinent test results, reviewed documented beta blocker date and time   History of Anesthesia Complications Negative for: history of anesthetic complications  Airway Mallampati: I  TM Distance: >3 FB Neck ROM: Full    Dental  (+) Dental Advisory Given, Teeth Intact   Pulmonary sleep apnea and Continuous Positive Airway Pressure Ventilation    Pulmonary exam normal        Cardiovascular hypertension, Pt. on medications Normal cardiovascular exam     Neuro/Psych  PSYCHIATRIC DISORDERS  Depression Bipolar Disorder   negative neurological ROS     GI/Hepatic negative GI ROS, Neg liver ROS,,,  Endo/Other  negative endocrine ROS    Renal/GU negative Renal ROS  negative genitourinary   Musculoskeletal  (+) Arthritis ,    Abdominal   Peds  Hematology negative hematology ROS (+)   Anesthesia Other Findings   Reproductive/Obstetrics                             Anesthesia Physical Anesthesia Plan  ASA: 2  Anesthesia Plan: General   Post-op Pain Management: Regional block*, Tylenol PO (pre-op)* and Toradol IV (intra-op)*   Induction: Intravenous  PONV Risk Score and Plan: 2 and Ondansetron, Dexamethasone, Treatment may vary due to age or medical condition and Midazolam  Airway Management Planned: Oral ETT  Additional Equipment: None  Intra-op Plan:   Post-operative Plan: Extubation in OR  Informed Consent: I have reviewed the patients History and Physical, chart, labs and discussed the procedure including the risks, benefits and alternatives for the proposed anesthesia with the patient or authorized representative who has indicated his/her understanding and acceptance.     Dental advisory given  Plan Discussed with:    Anesthesia Plan Comments:        Anesthesia Quick Evaluation

## 2022-05-27 NOTE — Anesthesia Postprocedure Evaluation (Signed)
Anesthesia Post Note  Patient: Timothy [name]  Procedure(s) Performed: REVERSE SHOULDER ARTHROPLASTY (Right: Shoulder)     Patient location during evaluation: PACU Anesthesia Type: General Level of consciousness: awake and alert Pain management: pain level controlled Vital Signs Assessment: post-procedure vital signs reviewed and stable Respiratory status: spontaneous breathing, nonlabored ventilation and respiratory function stable Cardiovascular status: blood pressure returned to baseline and stable Postop Assessment: no apparent nausea or vomiting Anesthetic complications: no   No notable events documented.  Last Vitals:  Vitals:   05/27/22 1645 05/27/22 1703  BP: (!) 150/75 (!) 142/83  Pulse: 78 76  Resp: 20 16  Temp: (!) 36.4 C 36.7 C  SpO2: 95% 96%    Last Pain:  Vitals:   05/27/22 1703  TempSrc: Oral  PainSc:                  Lidia Collum

## 2022-05-27 NOTE — Brief Op Note (Signed)
05/27/2022  3:41 PM  PATIENT:  Neoma Laming  71 y.o. male  PRE-OPERATIVE DIAGNOSIS:  Right shoulder rotator cuff arthropathy  POST-OPERATIVE DIAGNOSIS:  Right shoulder rotator cuff arthropathy  PROCEDURE:  Procedure(s) with comments: REVERSE SHOULDER ARTHROPLASTY (Right) - 120  SURGEON:  Surgeon(s) and Role:    * Stann Mainland, Elly Modena, MD - Primary  PHYSICIAN ASSISTANT: Jonelle Sidle, PA-C  ANESTHESIA:   regional and general  EBL:  50 mL   BLOOD ADMINISTERED:none  DRAINS: none   LOCAL MEDICATIONS USED:  NONE  SPECIMEN:  No Specimen  DISPOSITION OF SPECIMEN:  N/A  COUNTS:  YES  TOURNIQUET:  * No tourniquets in log *  DICTATION: .Note written in EPIC  PLAN OF CARE: Plan to place in observation overnight for pain control and Occupational Therapy.  PATIENT DISPOSITION:  PACU - hemodynamically stable.   Delay start of Pharmacological VTE agent (>24hrs) due to surgical blood loss or risk of bleeding: not applicable

## 2022-05-27 NOTE — Discharge Instructions (Signed)
Orthopedic surgery discharge instructions:  -Maintain postoperative bandage until follow-up appointment.  This is waterproof, and you may begin showering on postoperative day #3.  Do not submerge underwater.  Maintain that bandage until your follow-up appointment in 2 weeks.  -No lifting over 2 pounds with operateive arm.  You may use the arm immediately for activities of daily living such as bathing, washing your face and brushing your teeth, eating, and getting dressed.  Otherwise maintain your sling when you are out of the house and sleeping.  -Apply ice liberally to the shoulder throughout the day.  For mild to moderate pain use Tylenol and Advil as needed around-the-clock.  For breakthrough pain use oxycodone as necessary.  -You will return to see Dr. Jaja Switalski in the office in 2 weeks for routine postoperative check with x-rays.  

## 2022-05-27 NOTE — Op Note (Signed)
05/27/2022  3:44 PM  PATIENT:  Timothy [name]    PRE-OPERATIVE DIAGNOSIS:   1.  Right shoulder greater tuberosity fracture with malunion 2.  Right shoulder rotator cuff arthropathy  POST-OPERATIVE DIAGNOSIS:  Same  PROCEDURE: Right REVERSE SHOULDER ARTHROPLASTY  SURGEON:  Nicholes Stairs, MD  ASSISTANT: Jonelle Sidle, PA-C  Assistant attestation:  PA Thereasa Solo was present for the entire procedure.  ANESTHESIA:   General and interscalene  ESTIMATED BLOOD LOSS: 50 cc  PREOPERATIVE INDICATIONS:  Timothy [name] is a  71 y.o. male with a diagnosis of Right shoulder rotator cuff arthropathy who failed conservative measures and elected for surgical management.    The risks benefits and alternatives were discussed with the patient preoperatively including but not limited to the risks of infection, bleeding, nerve injury, cardiopulmonary complications, the need for revision surgery, dislocation, brachial plexus palsy, incomplete relief of pain, among others, and the patient was willing to proceed.  OPERATIVE IMPLANTS:  Zimmer 25 mm standard baseplate with a 35 mm central screw and 4 peripheral locking screws.  40 mm with a +3 mm lateralized glenosphere.  Zimmer identify shoulder stem size 9 with inset humeral tray +6 extended and a standard polyethylene.   OPERATIVE FINDINGS:  There was noted to be intact subscapularis as well as teres minor.  The infraspinatus and supraspinatus were completely torn minus an anterior band just posterior to the long head of the biceps tendon.  Long head of biceps tendon was intact.  There was central full-thickness cartilage loss of the humeral head.  Glenoid had some mild degenerative changes but significant degeneration of the labrum anterior and posterior.  OPERATIVE PROCEDURE: The patient was brought to the operating room and placed in the supine position. General anesthesia was administered. IV antibiotics were given. A Foley was not placed. Time  out was performed. The upper extremity was prepped and draped in usual sterile fashion. The patient was in a beachchair position. Deltopectoral approach was carried out. The biceps was tenodesed to the pectoralis tendon with #2 Fiberwire. The subscapularis was released off of the bone.   I then performed circumferential releases of the humerus, and then dislocated the head, and then reamed with the reamer to the above named size.  Of note there was a moderate size defect on the lateral posterior aspect of the proximal humerus consistent with the greater tuberosity fracture.  This had malunited almost directly posterior but significantly distal lysed.  There was some infraspinatus attached to this malunited fragment but not felt to be significant enough to try and mobilize due to likely poor tissue quality.  I then applied the jig, and cut the humeral head in 30 of retroversion, and then turned my attention to the glenoid.  Deep retractors were placed, and I resected the labrum, and then placed a guidepin into the center position on the glenoid, with slight inferior inclination. I then reamed over the guidepin, and this created a small metaphyseal cancellus blush inferiorly, removing just the cartilage to the subchondral bone superiorly. The base plate was selected and impacted place, and then I secured it centrally with a nonlocking screw, and I had excellent purchase both inferiorly and superiorly. I placed a short locking screws on anterior and posterior aspects.  I then turned my attention to the glenosphere, and impacted this into place, placing slight inferior offset (set on B).   The glenoid sphere was completely seated, and had engagement of the [facility] taper. I then turned my attention back  to the humerus.  I sequentially broached, and then trialed, and was found to restore soft tissue tension, and it had 2 finger tightness. Therefore the above named components were selected. The shoulder felt  stable throughout functional motion.  I then impacted the real prosthesis into place, as well as the real humeral tray, and reduced the shoulder. The shoulder had excellent motion, and was stable, and I irrigated the wounds copiously.   Subscapularis tendon was not repaired.  I then irrigated the shoulder copiously once more, 1 g of vancomycin powder placed into the deltopectoral interval, repaired the deltopectoral interval with #2 FiberWire followed by subcutaneous Vicryl, then monocryl for the skin,  with Steri-Strips and sterile gauze for the skin. The patient was awakened and returned back in stable and satisfactory condition. There no complications and they tolerated the procedure well.  All counts were correct x2.  Disposition:  Timothy [name] will be weightbearing to the right upper extremity up to 2 pounds.  He may begin doing activities of daily living with the right arm immediately.  However will be in his sling for sleeping and when he leaves the house.  He will be admitted for postoperative pain control and Occupational Therapy.

## 2022-05-27 NOTE — Transfer of Care (Signed)
Immediate Anesthesia Transfer of Care Note  Patient: Timothy [name]  Procedure(s) Performed: Procedure(s) with comments: REVERSE SHOULDER ARTHROPLASTY (Right) - 120  Patient Location: PACU  Anesthesia Type:General and Regional  Level of Consciousness: Patient easily awoken, sedated, comfortable, cooperative, following commands, responds to stimulation.   Airway & Oxygen Therapy: Patient spontaneously breathing, ventilating well, oxygen via simple oxygen mask.  Post-op Assessment: Report given to PACU RN, vital signs reviewed and stable, moving all extremities.   Post vital signs: Reviewed and stable.  Complications: No apparent anesthesia complications  Last Vitals:  Vitals Value Taken Time  BP 156/81 05/27/22 1555  Temp    Pulse 72 05/27/22 1556  Resp 15 05/27/22 1556  SpO2 100 % 05/27/22 1556  Vitals shown include unvalidated device data.  Last Pain:  Vitals:   05/27/22 1300  TempSrc:   PainSc: 0-No pain         Complications: No notable events documented.

## 2022-05-27 NOTE — H&P (Signed)
ORTHOPAEDIC H and P  REQUESTING PHYSICIAN: Nicholes Stairs, MD  PCP:  [name], MD  Chief Complaint: Right proximal humerus fracture  HPI: Timothy [name] is a 71 y.o. male who complains of right shoulder pain and weakness.  He has had a recent proximal humerus fracture of the greater tuberosity.  Unfortunately, also had a concomitant disruption of the superior rotator cuff.  He is here today for definitive treatment with reverse arthroplasty.  No new complaints at this time.  Past Medical History:  Diagnosis Date   Arthritis    Bipolar disorder ([place])    controlled by meds since 1997   Cancer ([place])    basal cell and pre melanoma on back   Neuromuscular disorder ([place])    gait and ambulation problems, but no diagnosis from Neurology   Pneumonia    Sleep apnea    cpap   Wears glasses    reading   Past Surgical History:  Procedure Laterality Date   APPENDECTOMY     COLONOSCOPY     KNEE ARTHROSCOPY W/ MENISCAL REPAIR  2004   left   ORIF HUMERUS FRACTURE Left 09/19/2013   Procedure: LEFT OPEN REDUCTION INTERNAL FIXATION (ORIF) PROXIMAL HUMERUS; ROTATOR CUFF REPAIR;  Surgeon: [name], MD;  Location: [facility];  Service: Orthopedics;  Laterality: Left;   TONSILLECTOMY     TOTAL HIP ARTHROPLASTY Left 07/18/2018   Procedure: TOTAL HIP ARTHROPLASTY ANTERIOR APPROACH;  Surgeon: Rod Can, MD;  Location: [facility];  Service: Orthopedics;  Laterality: Left;   Social History   Socioeconomic History   Marital status: Legally Separated    Spouse name: Not on file   Number of children: Not on file   Years of education: Not on file   Highest education level: Not on file  Occupational History   Not on file  Tobacco Use   Smoking status: Never   Smokeless tobacco: Never  Vaping Use   Vaping Use: Never used  Substance and Sexual Activity   Alcohol use: Yes    Comment: 5 days a week   Drug use: No   Sexual activity: Yes  Other Topics  Concern   Not on file  Social History Narrative   Right handed   Social Determinants of Health   Financial Resource Strain: Not on file  Food Insecurity: Not on file  Transportation Needs: Not on file  Physical Activity: Not on file  Stress: Not on file  Social Connections: Not on file   Family History  Adopted: Yes   Allergies  Allergen Reactions   Ciprofloxacin Diarrhea   Prior to Admission medications   Medication Sig Start Date End Date Taking? Authorizing Provider  Ascorbic Acid (VITAMIN C PO) Take 1 tablet by mouth daily.   Yes [provider]  aspirin EC 81 MG tablet Take 81 mg by mouth daily. Swallow whole.   Yes [provider]  bisoprolol-hydrochlorothiazide (ZIAC) 5-6.25 MG tablet TAKE 1 TABLET BY MOUTH EVERY DAY FOR BLOOD PRESSURE 03/02/22  Yes Alycia Rossetti, NP  Cholecalciferol (VITAMIN D-3) 125 MCG (5000 UT) TABS Take 5,000 Units by mouth every evening.    Yes [provider]  Cyanocobalamin (VITAMIN B-12) 1000 MCG SUBL Take 1 tablet SL Daily Patient taking differently: Take 1,000 mcg by mouth daily. 03/21/22  Yes [name], MD  divalproex (DEPAKOTE) 250 MG DR tablet Take 2 tablets (500 mg total) by mouth 2 (two) times daily. 10/21/21  Yes Cottle, Hiram Comber  Fredirick Maudlin., MD  oxyCODONE (ROXICODONE) 5 MG immediate release tablet Take 1 tablet (5 mg total) by mouth every 4 (four) hours as needed for severe pain. Patient not taking: Reported on 05/16/2022 02/26/22   Tretha Sciara, MD   No results found.  Positive ROS: All other systems have been reviewed and were otherwise negative with the exception of those mentioned in the HPI and as above.  Physical Exam: General: Alert, no acute distress Cardiovascular: No pedal edema Respiratory: No cyanosis, no use of accessory musculature GI: No organomegaly, abdomen is soft and non-tender Skin: No lesions in the area of chief complaint Neurologic: Sensation intact distally Psychiatric: Patient  is competent for consent with normal mood and affect Lymphatic: No axillary or cervical lymphadenopathy  MUSCULOSKELETAL: Right shoulder is warm and well-perfused he does have a moderate shoulder effusion.  But distally neurovascularly intact.  Assessment: 1.  Right shoulder acute traumatic rotator cuff tear 2.  Right proximal humerus greater tuberosity fracture with displacement  Plan: Plan to proceed today with reverse arthroplasty as definitive treatment for this injury.  We discussed the risk and benefits of the procedure including but not limited to bleeding, infection, damage to surrounding nerves and vessels, stiffness, failure of pain relief, persistent dysfunction including stiffness, risk of dislocation and periprosthetic fracture, as well as the risk of anesthesia.  He has provided informed consent.  Plan to admit postoperatively for pain control and Occupational Therapy.  Hopeful discharge Saturday morning.    Nicholes Stairs, MD Cell [phone]    05/27/2022 12:48 PM

## 2022-05-27 NOTE — Anesthesia Procedure Notes (Signed)
Procedure Name: Intubation Date/Time: 05/27/2022 2:04 PM  Performed by: Deliah Boston, CRNAPre-anesthesia Checklist: Patient identified, Emergency Drugs available, Suction available and Patient being monitored Patient Re-evaluated:Patient Re-evaluated prior to induction Oxygen Delivery Method: Circle system utilized Preoxygenation: Pre-oxygenation with 100% oxygen Induction Type: IV induction Ventilation: Mask ventilation without difficulty Laryngoscope Size: Mac and 4 Grade View: Grade I Tube type: Oral Tube size: 7.5 mm Number of attempts: 1 Airway Equipment and Method: Stylet and Oral airway Placement Confirmation: ETT inserted through vocal cords under direct vision, positive ETCO2 and breath sounds checked- equal and bilateral Secured at: 21 cm Tube secured with: Tape Dental Injury: Teeth and Oropharynx as per pre-operative assessment

## 2022-05-28 DIAGNOSIS — S42254P Nondisplaced fracture of greater tuberosity of right humerus, subsequent encounter for fracture with malunion: Secondary | ICD-10-CM | POA: Diagnosis not present

## 2022-05-28 LAB — BASIC METABOLIC PANEL
Anion gap: 5 (ref 5–15)
BUN: 21 mg/dL (ref 8–23)
CO2: 26 mmol/L (ref 22–32)
Calcium: 8.6 mg/dL — ABNORMAL LOW (ref 8.9–10.3)
Chloride: 106 mmol/L (ref 98–111)
Creatinine, Ser: 0.79 mg/dL (ref 0.61–1.24)
GFR, Estimated: >60 mL/min (ref >60)
Glucose, Bld: 168 mg/dL — ABNORMAL HIGH (ref 70–99)
Potassium: 4.2 mmol/L (ref 3.5–5.1)
Sodium: 137 mmol/L (ref 135–145)

## 2022-05-28 LAB — HEMOGLOBIN AND HEMATOCRIT, BLOOD
HCT: 36.5 % — ABNORMAL LOW (ref 39.0–52.0)
Hemoglobin: 12.1 g/dL — ABNORMAL LOW (ref 13.0–17.0)

## 2022-05-28 NOTE — Discharge Summary (Cosign Needed)
Physician Discharge Summary  Patient ID: Timothy [name] MRN: [id number] DOB/AGE: 06/29/1950 71 y.o.  Admit date: 05/27/2022 Discharge date: 05/28/2022  Admission Diagnoses:  S/P reverse total shoulder arthroplasty, right  Discharge Diagnoses:  Principal Problem:   S/P reverse total shoulder arthroplasty, right   Past Medical History:  Diagnosis Date   Arthritis    Bipolar disorder ([place])    controlled by meds since 1997   Cancer ([place])    basal cell and pre melanoma on back   Neuromuscular disorder ([place])    gait and ambulation problems, but no diagnosis from Neurology   Pneumonia    Sleep apnea    cpap   Wears glasses    reading    Surgeries: Procedure(s): REVERSE SHOULDER ARTHROPLASTY on 05/27/2022   Consultants (if any):   Discharged Condition: Improved  Hospital Course: Timothy [name] is an 71 y.o. male who was admitted 05/27/2022 with a diagnosis of S/P reverse total shoulder arthroplasty, right and went to the operating room on 05/27/2022 and underwent the above named procedures.    He was given perioperative antibiotics:  Anti-infectives (From admission, onward)    Start     Dose/Rate Route Frequency Ordered Stop   05/27/22 2000  ceFAZolin (ANCEF) IVPB 1 g/50 mL premix        1 g 100 mL/hr over 30 Minutes Intravenous Every 6 hours 05/27/22 1659 05/28/22 1140   05/27/22 1429  vancomycin (VANCOCIN) powder  Status:  Discontinued          As needed 05/27/22 1430 05/27/22 1659   05/27/22 1100  ceFAZolin (ANCEF) IVPB 2g/100 mL premix        2 g 200 mL/hr over 30 Minutes Intravenous On call to O.R. 05/27/22 1059 05/27/22 1405       He was given sequential compression devices, early ambulation, and aspirin for DVT prophylaxis.   He benefited maximally from the hospital stay and there were no complications.    Recent vital signs:  Vitals:   05/28/22 0431 05/28/22 0900  BP: 131/78 127/63  Pulse: 75 81  Resp: 16 16  Temp:  98.1 F (36.7 C)  SpO2: 99% 100%     Recent laboratory studies:  Lab Results  Component Value Date   HGB 12.1 (L) 05/28/2022   HGB 13.1 05/18/2022   HGB 13.2 03/21/2022   Lab Results  Component Value Date   WBC 10.2 05/18/2022   PLT 238 05/18/2022   No results found for: "INR" Lab Results  Component Value Date   NA 137 05/28/2022   K 4.2 05/28/2022   CL 106 05/28/2022   CO2 26 05/28/2022   BUN 21 05/28/2022   CREATININE 0.79 05/28/2022   GLUCOSE 168 (H) 05/28/2022     Allergies as of 05/28/2022       Reactions   Ciprofloxacin Diarrhea        Medication List     TAKE these medications    aspirin EC 81 MG tablet Take 81 mg by mouth daily. Swallow whole.   bisoprolol-hydrochlorothiazide 5-6.25 MG tablet Commonly known as: ZIAC TAKE 1 TABLET BY MOUTH EVERY DAY FOR BLOOD PRESSURE   divalproex 250 MG DR tablet Commonly known as: DEPAKOTE Take 2 tablets (500 mg total) by mouth 2 (two) times daily.   ondansetron 4 MG tablet Commonly known as: Zofran Take 1 tablet (4 mg total) by mouth every 8 (eight) hours as needed for vomiting or nausea.   oxyCODONE 5 MG immediate release tablet Commonly  known as: Roxicodone Take 1 tablet (5 mg total) by mouth every 6 (six) hours as needed for severe pain. What changed: when to take this   Vitamin B-12 1000 MCG Subl Take 1 tablet SL Daily What changed:  how much to take how to take this when to take this additional instructions   VITAMIN C PO Take 1 tablet by mouth daily.   Vitamin D-3 125 MCG (5000 UT) Tabs Take 5,000 Units by mouth every evening.          WEIGHT BEARING   Other:    No lifting over 2 pounds with operateive arm. You may use the arm immediately for activities of daily living such as bathing, washing your face and brushing your teeth, eating, and getting dressed.  Otherwise maintain your sling when you are out of the house and sleeping.     CONSTIPATION  Constipation is defined medically as fewer than three stools per  week and severe constipation as less than one stool per week.  Even if you have a regular bowel pattern at home, your normal regimen is likely to be disrupted due to multiple reasons following surgery.  Combination of anesthesia, postoperative narcotics, change in appetite and fluid intake all can affect your bowels.   YOU MUST use at least one of the following options; they are listed in order of increasing strength to get the job done.  They are all available over the counter, and you may need to use some, POSSIBLY even all of these options:    Drink plenty of fluids (prune juice may be helpful) and high fiber foods Colace 100 mg by mouth twice a day  Senokot for constipation as directed and as needed Dulcolax (bisacodyl), take with full glass of water  Miralax (polyethylene glycol) once or twice a day as needed.  If you have tried all these things and are unable to have a bowel movement in the first 3-4 days after surgery call either your surgeon or your primary doctor.    If you experience loose stools or diarrhea, hold the medications until you stool forms back up.  If your symptoms do not get better within 1 week or if they get worse, check with your doctor.  If you experience "the worst abdominal pain ever" or develop nausea or vomiting, please contact the office immediately for further recommendations for treatment.   ITCHING:  If you experience itching with your medications, try taking only a single pain pill, or even half a pain pill at a time.  You can also use Benadryl over the counter for itching or also to help with sleep.    MEDICATIONS:  See your medication summary on the "After Visit Summary" that nursing will review with you.  You may have some home medications which will be placed on hold until you complete the course of blood thinner medication.  It is important for you to complete the blood thinner medication as prescribed.  PRECAUTIONS:  If you experience chest pain or  shortness of breath - call 911 immediately for transfer to the hospital emergency department.   If you develop a fever greater that 101 F, purulent drainage from wound, increased redness or drainage from wound, foul odor from the wound/dressing, or calf pain - CONTACT YOUR SURGEON.  FOLLOW-UP APPOINTMENTS:  If you do not already have a post-op appointment, please call the office for an appointment to be seen by your surgeon.  Guidelines for how soon to be seen are listed in your "After Visit Summary", but are typically between 1-4 weeks after surgery.    MAKE SURE YOU:  Understand these instructions.  Get help right away if you are not doing well or get worse.    Thank you for letting us be a part of your medical care team.  It is a privilege we respect greatly.  We hope these instructions will help you stay on track for a fast and full recovery!   Diagnostic Studies: DG Shoulder Right Port  Result Date: 05/27/2022 CLINICAL DATA:  Status post reverse total right shoulder arthroplasty. EXAM: RIGHT SHOULDER - 1 VIEW COMPARISON:  Right shoulder radiographs 03/21/2022 FINDINGS: Interval reverse total right shoulder arthroplasty. No perihardware lucency is seen on limited single frontal view to indicate hardware failure or loosening. Mild acromioclavicular joint space narrowing with moderate peripheral degenerative osteophytes. Moderate lateral downsloping of the acromion. No acute fracture or dislocation. The visualized portion of the right lung is unremarkable. IMPRESSION: Interval reverse total right shoulder arthroplasty without evidence of hardware failure. Electronically Signed   By: Yvonne Kendall M.D.   On: 05/27/2022 16:25    Disposition: Discharge disposition: 01-Home or Self Care       Discharge Instructions     Call MD / Call 911   Complete by: As directed    If you experience chest pain or shortness of breath, CALL 911 and be  transported to the hospital emergency room.  If you develope a fever above 101 F, pus (white drainage) or increased drainage or redness at the wound, or calf pain, call your surgeon's office.   Constipation Prevention   Complete by: As directed    Drink plenty of fluids.  Prune juice may be helpful.  You may use a stool softener, such as Colace (over the counter) 100 mg twice a day.  Use MiraLax (over the counter) for constipation as needed.   Diet - low sodium heart healthy   Complete by: As directed    Driving restrictions   Complete by: As directed    No driving for 4 weeks   Increase activity slowly as tolerated   Complete by: As directed    No lifting over 2 pounds with operateive arm. You may use the arm immediately for activities of daily living such as bathing, washing your face and brushing your teeth, eating, and getting dressed.  Otherwise maintain your sling when you are out of the house and sleeping.   Lifting restrictions   Complete by: As directed    No lifting over 2 pounds with operateive arm.   Post-operative opioid taper instructions:   Complete by: As directed    POST-OPERATIVE OPIOID TAPER INSTRUCTIONS: It is important to wean off of your opioid medication as soon as possible. If you do not need pain medication after your surgery it is ok to stop day one. Opioids include: Codeine, Hydrocodone(Norco, Vicodin), Oxycodone(Percocet, oxycontin) and hydromorphone amongst others.  Long term and even short term use of opiods can cause: Increased pain response Dependence Constipation Depression Respiratory depression And more.  Withdrawal symptoms can include Flu like symptoms Nausea, vomiting And more Techniques to manage these symptoms Hydrate well Eat regular healthy meals Stay active Use relaxation techniques(deep breathing, meditating, yoga) Do Not substitute Alcohol to help with tapering If  you have been on opioids for less than two weeks and do not have pain  than it is ok to stop all together.  Plan to wean off of opioids This plan should start within one week post op of your joint replacement. Maintain the same interval or time between taking each dose and first decrease the dose.  Cut the total daily intake of opioids by one tablet each day Next start to increase the time between doses. The last dose that should be eliminated is the evening dose.           Follow-up Information     Nicholes Stairs, MD Follow up in 2 week(s).   Specialty: Orthopedic Surgery Why: For wound re-check Contact information: [address] [place] [address] [phone]                  Signed: Charlott Rakes, PA-C 05/28/2022, 4:26 PM

## 2022-05-28 NOTE — Progress Notes (Signed)
Patient discharged to home w/ family. Given all belongings, instructions, equipment. Verbalized understanding of all instrucitons. Escorted to pov via w/c.

## 2022-05-28 NOTE — TOC CM/SW Note (Signed)
Transition of Care [facility]) Screening Note  Patient Details  Name: Timothy [name] Date of Birth: August 14, 1950  Transition of Care [facility]) CM/SW Contact:    Sherie Don, LCSW Phone Number: 05/28/2022, 1:33 PM  Transition of Care Department [facility]) has reviewed patient and no TOC needs have been identified at this time. We will continue to monitor patient advancement through interdisciplinary progression rounds. If new patient transition needs arise, please place a TOC consult.

## 2022-05-28 NOTE — Progress Notes (Signed)
    Subjective: Patient seen in rounds for Dr. Stann Mainland.  Patient reports pain as mild to moderate.  Denies N/V/CP/SOB/Abd pain. He denies any tingling or numbness in RUE. He is ready to go home.   Objective:   VITALS:   Vitals:   05/27/22 2109 05/28/22 0144 05/28/22 0431 05/28/22 0900  BP: (!) 182/89 128/79 131/78 127/63  Pulse: (!) 101 88 75 81  Resp: _0 Temp: 97.7 F (36.5 C) 97.6 F (36.4 C)  98.1 F (36.7 C)  TempSrc: Oral Oral  Oral  SpO2: 95% 98% 99% 100%  Weight:      Height:        Patient sitting up in bed. Sling in place. NAD.  Neurologically intact Neurovascular intact Sensation intact distally Intact pulses distally Incision: dressing C/D/I No cellulitis present His motor function is improving. He still cannot extend his wrist. He can move his fingers on exam.   Lab Results  Component Value Date   WBC 10.2 05/18/2022   HGB 12.1 (L) 05/28/2022   HCT 36.5 (L) 05/28/2022   MCV 92.1 05/18/2022   PLT 238 05/18/2022   BMET    Component Value Date/Time   NA 137 05/28/2022 0357   K 4.2 05/28/2022 0357   CL 106 05/28/2022 0357   CO2 26 05/28/2022 0357   GLUCOSE 168 (H) 05/28/2022 0357   BUN 21 05/28/2022 0357   CREATININE 0.79 05/28/2022 0357   CREATININE 1.63 (H) 03/21/2022 1546   CALCIUM 8.6 (L) 05/28/2022 0357   EGFR 45 (L) 03/21/2022 1546   GFRNONAA >60 05/28/2022 0357   GFRNONAA 80 07/20/2020 1539     Assessment/Plan: 1 Day Post-Op   Principal Problem:   S/P reverse total shoulder arthroplasty, right   Weightbearing to the right upper extremity up to 2 pounds. He may begin doing activities of daily living with the right arm immediately. However will be in his sling for sleeping and when he leaves the house.  DVT ppx: Aspirin, SCDs, TEDS PO pain control PT/OT: Occupational therapy to come by today.  Dispo: D/c home. Follow-up in office in 2 weeks.    Charlott Rakes, PA-C 05/28/2022, 1:04 PM   [facility]  [place] [address], [place], [place] [address] Phone: [phone] [email or web address] Facebook  [employer]

## 2022-05-28 NOTE — Evaluation (Signed)
Occupational Therapy Evaluation Patient Details Name: Timothy [name] MRN: [id number] DOB: 10-06-1950 Today's Date: 05/28/2022   History of Present Illness Patient s/p R REVERSE SHOULDER ARTHROPLASTY   Clinical Impression   Mr. Timothy [name] is a 71 year old man s/p shoulder replacement without functional use of right dominant upper extremity secondary to effects of surgery and interscalene block and shoulder precautions. Therapist provided education and instruction to patient in regards to exercises, precautions, positioning, donning upper extremity clothing and bathing while maintaining shoulder precautions, and donning/doffing sling. Patient verbalized understanding and provided handouts to maximize retention of education. Patient to follow up with MD for further therapy needs.        Recommendations for follow up therapy are one component of a multi-disciplinary discharge planning process, led by the attending physician.  Recommendations may be updated based on patient status, additional functional criteria and insurance authorization.   Follow Up Recommendations  Follow physician's recommendations for discharge plan and follow up therapies     Assistance Recommended at Discharge Intermittent Supervision/Assistance  Patient can return home with the following A little help with bathing/dressing/bathroom;Assistance with cooking/housework;Help with stairs or ramp for entrance    Functional Status Assessment  Patient has had a recent decline in their functional status and demonstrates the ability to make significant improvements in function in a reasonable and predictable amount of time.  Equipment Recommendations  None recommended by OT    Recommendations for Other Services       Precautions / Restrictions Precautions Precautions: Fall Restrictions Weight Bearing Restrictions: Yes RUE Weight Bearing: Non weight bearing      Mobility Bed Mobility Overal bed mobility: Modified  Independent                  Transfers Overall transfer level: Modified independent                        Balance Overall balance assessment: Mild deficits observed, not formally tested                                         ADL either performed or assessed with clinical judgement   ADL                                               Vision   Vision Assessment?: No apparent visual deficits     Perception     Praxis      Pertinent Vitals/Pain Pain Assessment Pain Assessment: No/denies pain     Hand Dominance Right   Extremity/Trunk Assessment Upper Extremity Assessment Upper Extremity Assessment: RUE deficits/detail RUE Deficits / Details: impaired sensation and motor control secondary to block   Lower Extremity Assessment Lower Extremity Assessment: Overall WFL for tasks assessed (hx of LE weakness and gait abnormalities. Shuffling steps)   Cervical / Trunk Assessment Cervical / Trunk Assessment: Normal   Communication Communication Communication: No difficulties   Cognition Arousal/Alertness: Awake/alert Behavior During Therapy: WFL for tasks assessed/performed Overall Cognitive Status: Within Functional Limits for tasks assessed  General Comments       Exercises     Shoulder Instructions Shoulder Instructions Donning/doffing shirt without moving shoulder: Independent Method for sponge bathing under operated UE: Independent Donning/doffing sling/immobilizer: Independent Correct positioning of sling/immobilizer: Independent ROM for elbow, wrist and digits of operated UE: Independent Sling wearing schedule (on at all times/off for ADL's): Independent Proper positioning of operated UE when showering: Independent Dressing change: Independent Positioning of UE while sleeping: [name] expects to be discharged to::  Private residence Living Arrangements: Alone Available Help at Discharge: Family;Available PRN/intermittently Type of Home: House             Bathroom Shower/Tub: Walk-in shower         Home Equipment: Shower seat          Prior Functioning/Environment Prior Level of Function : Independent/Modified Independent                        OT Problem List: Decreased strength;Decreased range of motion;Impaired UE functional use;Pain      OT Treatment/Interventions:      OT Goals(Current goals can be found in the care plan section) Acute Rehab OT Goals OT Goal Formulation: All assessment and education complete, DC therapy  OT Frequency:      Co-evaluation              AM-PAC OT "6 Clicks" Daily Activity     Outcome Measure Help from another person eating meals?: A Little Help from another person taking care of personal grooming?: None Help from another person toileting, which includes using toliet, bedpan, or urinal?: None Help from another person bathing (including washing, rinsing, drying)?: None Help from another person to put on and taking off regular upper body clothing?: A Little Help from another person to put on and taking off regular lower body clothing?: A Little 6 Click Score: 21   End of Session Nurse Communication:  (IV out)  Activity Tolerance: Patient tolerated treatment well Patient left: in bed;with call bell/phone within reach;with bed alarm set  OT Visit Diagnosis: Pain                Time: 8616-8372 OT Time Calculation (min): 25 min Charges:  OT General Charges $OT Visit: 1 Visit OT Evaluation $OT Eval Low Complexity: 1 Low OT Treatments $Self Care/Home Management : 8-22 mins  Gustavo Lah, OTR/L Acute Care Rehab Services  Office [phone]   Lenward Chancellor 05/28/2022, 12:13 PM

## 2022-06-01 ENCOUNTER — Encounter (HOSPITAL_COMMUNITY): Payer: Self-pay | Admitting: Orthopedic Surgery

## 2022-06-09 DIAGNOSIS — Z4789 Encounter for other orthopedic aftercare: Secondary | ICD-10-CM | POA: Diagnosis not present

## 2022-06-25 NOTE — Progress Notes (Deleted)
MEDICARE ANNUAL WELLNESS VISIT AND FOLLOW UP Assessment:   Diagnoses and all orders for this visit:  Encounter for Medicare annual wellness exam Due annually  Health maintenance reviewed  Essential hypertension Continue medication Monitor blood pressure at home; call if consistently over 130/80 Continue DASH diet.   Reminder to go to the ER if any CP, SOB, nausea, dizziness, severe HA, changes vision/speech, left arm numbness and tingling and jaw pain.  Vitamin D deficiency Continue supplementation; goal is 60-100;  Defer vitamin D level  Stage 3 CKD(HCC) Increase fluids, avoid NSAIDS, monitor sugars, will monitor - CMP  Hypocalcemia - CMP  OSA on CPAP Reports 100% compliance; no concerns; follows with Dr. [name]  Other abnormal glucose Recent A1Cs at goal Discussed diet/exercise, weight management  Defer A1C;   Medication management CBC, CMP/GFR , TSH  Bipolar type 1 depression ([place]) Continue follow up with Dr. Clovis Pu Check valproic acid levels   History of adenomatous polyp of colon Due follow up, has scheduled 02/2021 per patient   Degenerative scoliosis/5R L5 radiculopathy Has had extensive workup per below, patient noting new R>L weakness that does correspond with CT myelogram and EMG/NCV results though note not the same concern he was having when he saw neurosurgery Has completed 2 courses of PT with no change in symptoms Monitor and if worsens notify the office  BMI 27, adult Continue to recommend diet heavy in fruits and veggies and low in animal meats, cheeses, and dairy products, appropriate calorie intake Discuss exercise recommendations routinely Continue to monitor weight at each visit    Over 30 minutes of exam, counseling, chart review, and critical decision making was performed  Future Appointments  Date Time Provider [place]  06/27/2022 10:30 AM Alycia Rossetti, NP [facility] None  10/20/2022  2:00 PM Cottle, Billey Co., MD  CP-CP None  11/10/2022  2:00 PM [name], MD [facility] None  11/17/2022  2:00 PM Ward Givens, NP [facility] None  06/12/2023 11:00 AM Alycia Rossetti, NP [facility] None     Plan:   During the course of the visit the patient was educated and counseled about appropriate screening and preventive services including:   Pneumococcal vaccine  Influenza vaccine Prevnar 13 Td vaccine Screening electrocardiogram Colorectal cancer screening Diabetes screening Glaucoma screening Nutrition counseling    Subjective:  Timothy [name] is a 72 y.o. male who presents for Medicare Annual Wellness Visit and 6 month follow up for HTN, hyperlipidemia, glucose management, and vitamin D Def.  Today he is frustrated about persistent extremity weakness.   He had L hip replaced by Dr. Lyla Glassing in 2020, reports was recovering steadily but reports winter he noted progressive weakness of hip and thigh on left, was evaluated with normal hip reported. He was evaluated by Dr. Mayer Camel who felt likely lumbar etiology. He was referred to neurosurgery, ? Dr. Newman Pies who advised degenerative disc disease but without indication for surgery at that time and was recommended PT. Patient reported no improvement with this. He continued to perceive weakness of bilateral legs, note L>R at that time. He underwent L5-S1 interlaminar injection in 04/2020 without relief. He was evaluated by neurology Dr. 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 The patient notes in the last 4-6 weeks has noted less L sided weakness, has had R "foot drop" - difficulty lifting  foot, shuffling and stumbling, notes lumbar pain is rare and intermittent. He reports called back this AM for recommendations and was advised further PT, very frustrated as has  completed 2 courses without benefit thus far. Has continued with home exercises for back. He continues to have weakness R>L. No foot drop.  Not using cane  Patient  followed by Dr Clovis Pu for Bipolar Depression & is on Depakote. Levels were checked in Feb 2022. Patient feels he is doing well.   He has OSA on CPAP since 11/2017; no issues, endorses 100% compliance and endorses restorative sleep. Followed by Dr. Beacher May annually.   BMI is There is no height or weight on file to calculate BMI., he has been working on diet and exercise, does free weights and back exercises at home, no longer able to take walks or hike due to extremity weakness working up by neuro.  Wt Readings from Last 3 Encounters:  05/27/22 163 lb 2.3 oz (74 kg)  05/18/22 164 lb (74.4 kg)  03/21/22 165 lb (74.8 kg)   He does not check BP at home, today their BP is    BP Readings from Last 3 Encounters:  05/28/22 127/63  05/18/22 120/76  03/21/22 125/74    He does workout. He denies chest pain, shortness of breath, dizziness.   He is not on cholesterol medication. His cholesterol is at goal and has been for several years. The cholesterol last visit was:   Lab Results  Component Value Date   CHOL 176 03/21/2022   HDL 60 03/21/2022   LDLCALC 93 03/21/2022   TRIG 125 03/21/2022   CHOLHDL 2.9 03/21/2022   He has been working on diet and exercise for glucose management, and denies foot ulcerations, increased appetite, nausea, paresthesia of the feet, polydipsia, polyuria, visual disturbances, vomiting and weight loss. Last A1C in the office was:  Lab Results  Component Value Date   HGBA1C 5.3 03/21/2022   Last GFR Lab Results  Component Value Date   GFRNONAA >60 05/28/2022    Patient is on Vitamin D supplement, no recent dose change  Lab Results  Component Value Date   VD25OH 101 (H) 03/21/2022      Medication Review:   Current Outpatient Medications (Cardiovascular):    bisoprolol-hydrochlorothiazide (ZIAC)  5-6.25 MG tablet, TAKE 1 TABLET BY MOUTH EVERY DAY FOR BLOOD PRESSURE   Current Outpatient Medications (Analgesics):    aspirin EC 81 MG tablet, Take 81 mg by mouth daily. Swallow whole.   oxyCODONE (ROXICODONE) 5 MG immediate release tablet, Take 1 tablet (5 mg total) by mouth every 6 (six) hours as needed for severe pain.  Current Outpatient Medications (Hematological):    Cyanocobalamin (VITAMIN B-12) 1000 MCG SUBL, Take 1 tablet SL Daily (Patient taking differently: Take 1,000 mcg by mouth daily.)  Current Outpatient Medications (Other):    Ascorbic Acid (VITAMIN C PO), Take 1 tablet by mouth daily.   Cholecalciferol (VITAMIN D-3) 125 MCG (5000 UT) TABS, Take 5,000 Units by mouth every evening.    divalproex (DEPAKOTE) 250 MG DR tablet, Take 2 tablets (500 mg total) by mouth 2 (two) times daily.   ondansetron (ZOFRAN) 4 MG tablet, Take 1 tablet (4 mg total) by mouth every 8 (eight) hours as needed for vomiting or nausea.  Allergies: Allergies  Allergen Reactions   Ciprofloxacin Diarrhea    Current Problems (verified) has HTN (hypertension); Vitamin D deficiency; Other abnormal glucose; Diarrhea; Medication management; Depression; History of adenomatous polyp of colon; S/P total hip  arthroplasty; OSA on CPAP; Bipolar I disorder ([place]); Degenerative scoliosis in adult patient; and S/P reverse total shoulder arthroplasty, right on their problem list.  Screening Tests Immunization History  Administered Date(s) Administered   Moderna Sars-Covid-2 Vaccination 07/02/2019, 07/30/2019   PFIZER(Purple Top)SARS-COV-2 Vaccination 03/13/2020   Pneumococcal Conjugate-13 04/05/2017   Pneumococcal Polysaccharide-23 11/22/2018   Td 11/06/2017    Preventative care: Last colonoscopy: 03/2021  Prior vaccinations: TD or Tdap: 2019 Influenza: 04/2018- declines   Pneumococcal: 10/2018 Prevnar13: 2018 Shingles/Zostavax: had at previous practice Covid 19: 2/2, 2021, moderna + 02/2020  Names of  Other Physician/Practitioners you currently use: 1. [facility] here for primary care 2. Dr. [place], eye doctor, last visit 2022, no issues, readers  3. [place], dentist, last visit 2022 4. Dermatology specialists, annually, last in 09/2019, has scheduled 11/2021  Patient Care Team: [name], MD as PCP - General (Internal Medicine) Cottle, Billey Co., MD as Attending Physician (Psychiatry) Pieter Partridge, DO as Consulting Physician (Neurology)  Surgical: He  has a past surgical history that includes Tonsillectomy; Appendectomy; Colonoscopy; Knee arthroscopy w/ meniscal repair (2004); ORIF humerus fracture (Left, 09/19/2013); Total hip arthroplasty (Left, 07/18/2018); and Reverse shoulder arthroplasty (Right, 05/27/2022). Family His family history is not on file. He was adopted. Social history  He reports that he has never smoked. He has never used smokeless tobacco. He reports current alcohol use. He reports that he does not use drugs.  MEDICARE WELLNESS OBJECTIVES: Physical activity:   Cardiac risk factors:   Depression/mood screen:      12/09/2021    2:51 PM  Depression screen PHQ 2/9  Decreased Interest 0  Down, Depressed, Hopeless 0  PHQ - 2 Score 0    ADLs:     05/27/2022    5:06 PM 05/27/2022    5:05 PM  In your present state of health, do you have any difficulty performing the following activities:  Hearing?  0  Vision?  0  Difficulty concentrating or making decisions?  0  Walking or climbing stairs?  1  Dressing or bathing?  0  Doing errands, shopping? 0      Cognitive Testing  Alert? Yes  Normal Appearance?Yes  Oriented to person? Yes  Place? Yes   Time? Yes  Recall of three objects?  Yes  Can perform simple calculations? Yes  Displays appropriate judgment?Yes  Can read the correct time from a watch face?Yes  EOL planning:     Objective:   There were no vitals filed for this visit.    There is no height  or weight on file to calculate BMI.  General appearance: alert, no distress, WD/WN, male HEENT: normocephalic, sclerae anicteric, TMs pearly, nares patent, no discharge or erythema, pharynx normal Oral cavity: MMM, no lesions Neck: supple, no lymphadenopathy, no thyromegaly, no masses Heart: RRR, normal S1, S2, no murmurs Lungs: CTA bilaterally, no wheezes, rhonchi, or rales Abdomen: +bs, soft, non tender, non distended, no masses, no hepatomegaly, no splenomegaly Musculoskeletal: nontender, no swelling, no obvious deformity.  Extremities: no edema, no cyanosis, no clubbing Pulses: 2+ symmetric, upper and lower extremities, normal cap refill Neurological: alert, oriented x 3, CN2-12 intact, strength normal upper extremities, lower extremities R 4/5 with knee extension and flexion, Left 5/5, ankle flexion and extension is 5/5 and symmetrical. Sensation normal throughout, DTRs 2+ throughout, no cerebellar signs, gait mildly wide based and shuffling. Psychiatric: normal affect, behavior normal, pleasant   Medicare Attestation I have personally reviewed: The  patient's medical and social history Their use of alcohol, tobacco or illicit drugs Their current medications and supplements The patient's functional ability including ADLs,fall risks, home safety risks, cognitive, and hearing and visual impairment Diet and physical activities Evidence for depression or mood disorders  The patient's weight, height, BMI, and visual acuity have been recorded in the chart.  I have made referrals, counseling, and provided education to the patient based on review of the above and I have provided the patient with a written personalized care plan for preventive services.     Alycia Rossetti, NP   06/25/2022

## 2022-06-27 ENCOUNTER — Encounter: Payer: Self-pay | Admitting: Internal Medicine

## 2022-06-27 ENCOUNTER — Ambulatory Visit: Payer: PPO | Admitting: Nurse Practitioner

## 2022-06-27 ENCOUNTER — Ambulatory Visit (INDEPENDENT_AMBULATORY_CARE_PROVIDER_SITE_OTHER): Payer: PPO | Admitting: Internal Medicine

## 2022-06-27 VITALS — BP 120/80 | HR 75 | Temp 98.1°F | Resp 16 | Ht 69.0 in | Wt 172.0 lb

## 2022-06-27 DIAGNOSIS — E782 Mixed hyperlipidemia: Secondary | ICD-10-CM

## 2022-06-27 DIAGNOSIS — R269 Unspecified abnormalities of gait and mobility: Secondary | ICD-10-CM

## 2022-06-27 DIAGNOSIS — N183 Chronic kidney disease, stage 3 unspecified: Secondary | ICD-10-CM

## 2022-06-27 DIAGNOSIS — R7309 Other abnormal glucose: Secondary | ICD-10-CM

## 2022-06-27 DIAGNOSIS — Z79899 Other long term (current) drug therapy: Secondary | ICD-10-CM | POA: Diagnosis not present

## 2022-06-27 DIAGNOSIS — F319 Bipolar disorder, unspecified: Secondary | ICD-10-CM

## 2022-06-27 DIAGNOSIS — F5101 Primary insomnia: Secondary | ICD-10-CM

## 2022-06-27 DIAGNOSIS — E559 Vitamin D deficiency, unspecified: Secondary | ICD-10-CM | POA: Diagnosis not present

## 2022-06-27 DIAGNOSIS — I10 Essential (primary) hypertension: Secondary | ICD-10-CM | POA: Diagnosis not present

## 2022-06-27 DIAGNOSIS — Z86010 Personal history of colonic polyps: Secondary | ICD-10-CM

## 2022-06-27 DIAGNOSIS — R7989 Other specified abnormal findings of blood chemistry: Secondary | ICD-10-CM

## 2022-06-27 DIAGNOSIS — G4733 Obstructive sleep apnea (adult) (pediatric): Secondary | ICD-10-CM

## 2022-06-27 DIAGNOSIS — M4150 Other secondary scoliosis, site unspecified: Secondary | ICD-10-CM

## 2022-06-27 DIAGNOSIS — Z0000 Encounter for general adult medical examination without abnormal findings: Secondary | ICD-10-CM

## 2022-06-27 DIAGNOSIS — Z6827 Body mass index (BMI) 27.0-27.9, adult: Secondary | ICD-10-CM

## 2022-06-27 MED ORDER — TRAZODONE HCL 50 MG PO TABS: ORAL_TABLET | ORAL | 1 refills | Status: DC

## 2022-06-27 NOTE — Patient Instructions (Signed)

## 2022-06-27 NOTE — Progress Notes (Signed)
Future Appointments  Date Time Provider Department  09/26/2022                 cpe  2:00 PM [name], MD [facility]  10/20/2022  2:00 PM Cottle, Billey Co., MD CP-CP  11/17/2022  2:00 PM Ward Givens, NP [facility]NA  06/12/2023                  wellness 11:00 AM Alycia Rossetti, NP [facility]    History of Present Illness:       This very nice 72 y.o. MWM presents for 3 month follow up with HTN, HLD, Pre-Diabetes and Vitamin D Deficiency.  Dr Clovis Pu follows patient on Depakote for Bipolar Depression .  Patient also has c/o bilateral lower extremity weakness of obscure etiology & is followed by Dr Tomi Likens and also has been evaluated at [facility].  Patient reports shuffling gait & frequent falls.  Patient had a slightly  low Vit B12 level of  429 in Feb 2023 & 458 in July 2023.        Patient continues to have short steppage shuffling type gait & desires to have further evaluation of his lower limb weakness. He also relates occasional falls.         Patient is treated for HTN (2012) & BP has been controlled at home. Today's BP is at goal - 120/80. Patient has had no complaints of any cardiac type chest pain, palpitations, dyspnea Vertell Limber /PND, dizziness, claudication, or dependent edema.       Hyperlipidemia is controlled with diet & meds. Patient denies myalgias or other med SE's. Last Lipids were at goal:  Lab Results  Component Value Date   CHOL 176 03/21/2022   HDL 60 03/21/2022   LDLCALC 93 03/21/2022   TRIG 125 03/21/2022   CHOLHDL 2.9 03/21/2022     Also, the patient is monitored for glucose intolerance  and has had no symptoms of reactive hypoglycemia, diabetic polys, paresthesias or visual blurring.  Last A1c was normal & at goal:  Lab Results  Component Value Date   HGBA1C 5.3 03/21/2022                                                        Further, the patient also has history of Vitamin D Deficiency ("28" /2017)  and supplements vitamin  D without any suspected side-effects. Last vitamin D was at goal:   Lab Results  Component Value Date   VD25OH 101 (H) 03/21/2022       Current Outpatient Medications  Medication Instructions   VITAMIN C  1 tablet daily   aspirin EC    81 mg Daily   bisoprolol-hctz 5-6.25 MG tablet TAKE 1 TABLET  EVERY DAY   DEPAKOTE  500 mg 2 times daily   oxyCODONE  5 mg  Every 4 hours PRN   Vitamin D  5,000 Units Every evening     Allergies  Allergen Reactions   Ciprofloxacin Diarrhea    PMHx:   Past Medical History:  Diagnosis Date   Arthritis    Bipolar disorder ([place])    controlled by meds since 1997   Pneumonia    Sleep apnea    cpap   Wears glasses    reading  Immunization History  Administered Date(s) Administered   Moderna Sars-Covid-2 Vacc 07/02/2019, 07/30/2019   PFIZER-SARS-COV-2 Vacc 03/13/2020   Pneumococcal  - 13 04/05/2017   Pneumococcal  - 23 11/22/2018   Td 11/06/2017     Past Surgical History:  Procedure Laterality Date   APPENDECTOMY     COLONOSCOPY     KNEE ARTHROSCOPY W/ MENISCAL REPAIR  2004   left   ORIF HUMERUS FRACTURE Left 09/19/2013   Procedure: LEFT OPEN REDUCTION INTERNAL FIXATION (ORIF) PROXIMAL HUMERUS; ROTATOR CUFF REPAIR;  Surgeon: [name], MD;  Location: [facility];  Service: Orthopedics;  Laterality: Left;   TONSILLECTOMY     TOTAL HIP ARTHROPLASTY Left 07/18/2018   Procedure: TOTAL HIP ARTHROPLASTY ANTERIOR APPROACH;  Surgeon: Rod Can, MD;  Location: [facility];  Service: Orthopedics;  Laterality: Left;    FHx:    Reviewed / unchanged  SHx:    Reviewed / unchanged   Systems Review:  Constitutional: Denies fever, chills, wt changes, headaches, insomnia, fatigue, night sweats, change in appetite. Eyes: Denies redness, blurred vision, diplopia, discharge, itchy, watery eyes.  ENT: Denies discharge, congestion, post nasal drip, epistaxis, sore throat, earache, hearing loss, dental pain, tinnitus, vertigo,  sinus pain, snoring.  CV: Denies chest pain, palpitations, irregular heartbeat, syncope, dyspnea, diaphoresis, orthopnea, PND, claudication or edema. Respiratory: denies cough, dyspnea, DOE, pleurisy, hoarseness, laryngitis, wheezing.  Gastrointestinal: Denies dysphagia, odynophagia, heartburn, reflux, water brash, abdominal pain or cramps, nausea, vomiting, bloating, diarrhea, constipation, hematemesis, melena, hematochezia  or hemorrhoids. Genitourinary: Denies dysuria, frequency, urgency, nocturia, hesitancy, discharge, hematuria or flank pain. Musculoskeletal: Denies arthralgias, myalgias, stiffness, jt. swelling, pain, limping or strain/sprain.  Skin: Denies pruritus, rash, hives, warts, acne, eczema or change in skin lesion(s). Neuro: No weakness, tremor, incoordination, spasms, paresthesia or pain. Psychiatric: Denies confusion, memory loss or sensory loss. Endo: Denies change in weight, skin or hair change.  Heme/Lymph: No excessive bleeding, bruising or enlarged lymph nodes.  Physical Exam  BP 120/80   Pulse 75   Temp 98.1 F (36.7 C)   Resp 16   Ht '5\' 9"'$  (1.753 m)   Wt 172 lb (78 kg)   SpO2 97%   BMI 25.40 kg/m   Appears  well nourished, well groomed  and in no distress.  Eyes: PERRLA, EOMs, conjunctiva no swelling or erythema. Sinuses: No frontal/maxillary tenderness ENT/Mouth: EAC's clear, TM's nl w/o erythema, bulging. Nares clear w/o erythema, swelling, exudates. Oropharynx clear without erythema or exudates. Oral hygiene is good. Tongue normal, non obstructing. Hearing intact.  Neck: Supple. Thyroid not palpable. Car 2+/2+ without bruits, nodes or JVD. Chest: Respirations nl with BS clear & equal w/o rales, rhonchi, wheezing or stridor.  Cor: Heart sounds normal w/ regular rate and rhythm without sig. murmurs, gallops, clicks or rubs. Peripheral pulses normal and equal  without edema.  Abdomen: Soft & bowel sounds normal. Non-tender w/o guarding, rebound, hernias,  masses or organomegaly.  Lymphatics: Unremarkable.   Musculoskeletal:  Unable to arise on tip toes. Short steppage shuffling gait with poor balance.  Skin: Warm, dry without exposed rashes, lesions or ecchymosis apparent.  Neuro: Cranial nerves intact, reflexes equal bilaterally.  Sensory-motor testing grossly intact. Tendon reflexes grossly intact.  Pysch: Alert & oriented x 3.  Insight and judgement nl & appropriate. No ideations.  Assessment and Plan:  1. Essential hypertension  - CBC with Differential/Platelet - COMPLETE METABOLIC PANEL WITH GFR - Magnesium - TSH  2. Hyperlipidemia, mixed  - Lipid panel - TSH  3. Abnormal glucose  - Hemoglobin A1c - Insulin, random  4. Vitamin D deficiency  - VITAMIN D 25 Hydroxy  5. Gait disorder  - Ambulatory referral to Neurology  - Dr Sherlean Foot / Neurology  /[facility] / [place], [place]   6. Bipolar I disorder (HCC)  - Valproic acid level  7. Medication management  - CBC with Differential/Platelet - COMPLETE METABOLIC PANEL WITH GFR - Magnesium - Lipid panel - TSH - Hemoglobin A1c - Insulin, random - VITAMIN D 25 Hydroxy   - Valproic acid level  8. Primary insomnia  - traZODone (DESYREL) 50 MG tablet;  Take  1/2 to 1 tablet   3 x /day  as directed   Dispense: 90 tablet; Refill: 1           Discussed  regular exercise, BP monitoring, weight control to achieve/maintain BMI less than 25 and discussed med and SE's. Recommended labs to assess and monitor clinical status with further disposition pending results of labs.  I discussed the assessment and treatment plan with the patient. The patient was provided an opportunity to ask questions and all were answered. The patient agreed with the plan and demonstrated an understanding of the instructions.  I provided over 30 minutes of exam, counseling, chart review and  complex critical decision making.        The patient was advised  to call back or seek an in-person evaluation if the symptoms worsen or if the condition fails to improve as anticipated.   Kirtland Bouchard, MD

## 2022-06-28 ENCOUNTER — Other Ambulatory Visit: Payer: Self-pay | Admitting: Internal Medicine

## 2022-06-28 LAB — CBC WITH DIFFERENTIAL/PLATELET
Absolute Monocytes: 757 cells/uL (ref 200–950)
Basophils Absolute: 17 cells/uL (ref 0–200)
Basophils Relative: 0.2 %
Eosinophils Absolute: 77 cells/uL (ref 15–500)
Eosinophils Relative: 0.9 %
HCT: 40.1 % (ref 38.5–50.0)
Hemoglobin: 13.5 g/dL (ref 13.2–17.1)
Lymphs Abs: 2761 cells/uL (ref 850–3900)
MCH: 29.5 pg (ref 27.0–33.0)
MCHC: 33.7 g/dL (ref 32.0–36.0)
MCV: 87.7 fL (ref 80.0–100.0)
MPV: 11.3 fL (ref 7.5–12.5)
Monocytes Relative: 8.8 %
Neutro Abs: 4988 cells/uL (ref 1500–7800)
Neutrophils Relative %: 58 %
Platelets: 245 10*3/uL (ref 140–400)
RBC: 4.57 10*6/uL (ref 4.20–5.80)
RDW: 12.6 % (ref 11.0–15.0)
Total Lymphocyte: 32.1 %
WBC: 8.6 10*3/uL (ref 3.8–10.8)

## 2022-06-28 LAB — LIPID PANEL
Cholesterol: 164 mg/dL (ref <200)
HDL: 56 mg/dL (ref >=40)
LDL Cholesterol (Calc): 85 mg/dL (calc)
Non-HDL Cholesterol (Calc): 108 mg/dL (calc) (ref <130)
Total CHOL/HDL Ratio: 2.9 (calc) (ref <5.0)
Triglycerides: 126 mg/dL (ref <150)

## 2022-06-28 LAB — HEMOGLOBIN A1C
Hgb A1c MFr Bld: 5.4 % of total Hgb (ref <5.7)
Mean Plasma Glucose: 108 mg/dL
eAG (mmol/L): 6 mmol/L

## 2022-06-28 LAB — COMPLETE METABOLIC PANEL WITH GFR
AG Ratio: 1.3 (calc) (ref 1.0–2.5)
ALT: 13 U/L (ref 9–46)
AST: 16 U/L (ref 10–35)
Albumin: 4.1 g/dL (ref 3.6–5.1)
Alkaline phosphatase (APISO): 66 U/L (ref 35–144)
BUN: 22 mg/dL (ref 7–25)
CO2: 30 mmol/L (ref 20–32)
Calcium: 9.5 mg/dL (ref 8.6–10.3)
Chloride: 103 mmol/L (ref 98–110)
Creat: 0.85 mg/dL (ref 0.70–1.28)
Globulin: 3.2 g/dL (calc) (ref 1.9–3.7)
Glucose, Bld: 93 mg/dL (ref 65–99)
Potassium: 4.4 mmol/L (ref 3.5–5.3)
Sodium: 141 mmol/L (ref 135–146)
Total Bilirubin: 0.3 mg/dL (ref 0.2–1.2)
Total Protein: 7.3 g/dL (ref 6.1–8.1)
eGFR: 93 mL/min/{1.73_m2} (ref >=60)

## 2022-06-28 LAB — VITAMIN D 25 HYDROXY (VIT D DEFICIENCY, FRACTURES): Vit D, 25-Hydroxy: 77 ng/mL (ref 30–100)

## 2022-06-28 LAB — VALPROIC ACID LEVEL: Valproic Acid Lvl: 53.2 mg/L (ref 50.0–100.0)

## 2022-06-28 LAB — INSULIN, RANDOM: Insulin: 11.6 u[IU]/mL

## 2022-06-28 LAB — MAGNESIUM: Magnesium: 2 mg/dL (ref 1.5–2.5)

## 2022-06-28 LAB — TSH: TSH: 0.35 mIU/L — ABNORMAL LOW (ref 0.40–4.50)

## 2022-06-28 NOTE — Progress Notes (Signed)
<><><><><><><><><><><><><><><><><><><><><><><><><><><><><><><><><> <><><><><><><><><><><><><><><><><><><><><><><><><><><><><><><><><> -   Test results slightly outside the reference range are not unusual. If there is anything important, I will review this with you,  otherwise it is considered normal test values.  If you have further questions,  please do not hesitate to contact me at the office or via My Chart.  <><><><><><><><><><><><><><><><><><><><><><><><><><><><><><><><><> <><><><><><><><><><><><><><><><><><><><><><><><><><><><><><><><><>  -  Thyroid is borderline on the high side of Normal  - but OK - not enough to   recommend any changes at this point in time, but will continue to follow closely  <><><><><><><><><><><><><><><><><><><><><><><><><><><><><><><><><>  - Total Chol= 164  & LDL Cho l= 85  - Both  Excellent   - Very low risk for Heart Attack  / Stroke <><><><><><><><><><><><><><><><><><><><><><><><><><><><><><><><><>  - A1c - Normal - No Diabetes   <><><><><><><><><><><><><><><><><><><><><><><><><><><><><><><><><>  - Vitamin D = 77 - Excellent - Please keep dose same  <><><><><><><><><><><><><><><><><><><><><><><><><><><><><><><><><>  - Depakote  / Valproic  level - OK   - Keep dose same  <><><><><><><><><><><><><><><><><><><><><><><><><><><><><><><><><>  - All else OK <><><><><><><><><><><><><><><><><><><><><><><><><><><><><><><><><>  - Katrina, Our Referral Coordinator is working on the Referral to    Dr Tonye Royalty - Movement Disorders specialist at                                             [place] in [place]  <><><><><><><><><><><><><><><><><><><><><><><><><><><><><><><><><> <><><><><><><><><><><><><><><><><><><><><><><><><><><><><><><><><>

## 2022-07-04 ENCOUNTER — Telehealth: Payer: Self-pay | Admitting: Internal Medicine

## 2022-07-04 NOTE — Telephone Encounter (Signed)
Pt is wanting an update on Referral to [facility]

## 2022-07-04 NOTE — Telephone Encounter (Signed)
Patient scheduled w/ [facility], Dr. Sherren Kerns, neuromuscular.  Patient aware of appt 11/18/22 10am [phone]

## 2022-07-07 DIAGNOSIS — Z4789 Encounter for other orthopedic aftercare: Secondary | ICD-10-CM | POA: Diagnosis not present

## 2022-07-19 ENCOUNTER — Other Ambulatory Visit: Payer: Self-pay | Admitting: Internal Medicine

## 2022-07-19 DIAGNOSIS — F5101 Primary insomnia: Secondary | ICD-10-CM

## 2022-07-27 DIAGNOSIS — M25511 Pain in right shoulder: Secondary | ICD-10-CM | POA: Diagnosis not present

## 2022-07-27 DIAGNOSIS — M25611 Stiffness of right shoulder, not elsewhere classified: Secondary | ICD-10-CM | POA: Diagnosis not present

## 2022-08-02 ENCOUNTER — Encounter: Payer: PPO | Admitting: Internal Medicine

## 2022-08-03 DIAGNOSIS — M25511 Pain in right shoulder: Secondary | ICD-10-CM | POA: Diagnosis not present

## 2022-08-03 DIAGNOSIS — M25611 Stiffness of right shoulder, not elsewhere classified: Secondary | ICD-10-CM | POA: Diagnosis not present

## 2022-08-04 DIAGNOSIS — Z4789 Encounter for other orthopedic aftercare: Secondary | ICD-10-CM | POA: Diagnosis not present

## 2022-08-05 DIAGNOSIS — M25611 Stiffness of right shoulder, not elsewhere classified: Secondary | ICD-10-CM | POA: Diagnosis not present

## 2022-08-05 DIAGNOSIS — M25511 Pain in right shoulder: Secondary | ICD-10-CM | POA: Diagnosis not present

## 2022-08-09 DIAGNOSIS — M25511 Pain in right shoulder: Secondary | ICD-10-CM | POA: Diagnosis not present

## 2022-08-09 DIAGNOSIS — M25611 Stiffness of right shoulder, not elsewhere classified: Secondary | ICD-10-CM | POA: Diagnosis not present

## 2022-08-12 DIAGNOSIS — M25611 Stiffness of right shoulder, not elsewhere classified: Secondary | ICD-10-CM | POA: Diagnosis not present

## 2022-08-12 DIAGNOSIS — M25511 Pain in right shoulder: Secondary | ICD-10-CM | POA: Diagnosis not present

## 2022-08-16 DIAGNOSIS — M25611 Stiffness of right shoulder, not elsewhere classified: Secondary | ICD-10-CM | POA: Diagnosis not present

## 2022-08-16 DIAGNOSIS — M25511 Pain in right shoulder: Secondary | ICD-10-CM | POA: Diagnosis not present

## 2022-08-18 DIAGNOSIS — M25611 Stiffness of right shoulder, not elsewhere classified: Secondary | ICD-10-CM | POA: Diagnosis not present

## 2022-08-18 DIAGNOSIS — M25511 Pain in right shoulder: Secondary | ICD-10-CM | POA: Diagnosis not present

## 2022-08-23 DIAGNOSIS — M25611 Stiffness of right shoulder, not elsewhere classified: Secondary | ICD-10-CM | POA: Diagnosis not present

## 2022-08-23 DIAGNOSIS — M25511 Pain in right shoulder: Secondary | ICD-10-CM | POA: Diagnosis not present

## 2022-08-25 DIAGNOSIS — M25611 Stiffness of right shoulder, not elsewhere classified: Secondary | ICD-10-CM | POA: Diagnosis not present

## 2022-08-25 DIAGNOSIS — M25511 Pain in right shoulder: Secondary | ICD-10-CM | POA: Diagnosis not present

## 2022-08-30 DIAGNOSIS — M25511 Pain in right shoulder: Secondary | ICD-10-CM | POA: Diagnosis not present

## 2022-08-30 DIAGNOSIS — M25611 Stiffness of right shoulder, not elsewhere classified: Secondary | ICD-10-CM | POA: Diagnosis not present

## 2022-08-31 NOTE — Progress Notes (Unsigned)
NEUROLOGY FOLLOW UP OFFICE NOTE  CLOID VOSSLER [id number]  Assessment/Plan:   Gradually worsening right leg numbness   MRI of cervical and thoracic spine on 04/20/2021 showed degenerative changes but no spinal canal stenosis causing myelopathy.  MRI of brain on 04/29/2021 showed incidental remote small infarct in the medial right parietal lobe and within the posterior left frontal white matter but no explanation for his subjective bilateral lower extremity weakness.  Symptoms may be explained by right L5 radiculopathy as nerve root irritation noted on CT myelogram.  Also, sensory symptoms may correlate with a sensory peroneal neuropathy.  However, both NCV-EMGs were normal.  I have no other explanation of his symptoms.    I recommend following up with the spine specialist to see if he can get a repeat epidural injection to evaluate for any alleviation of the sensory deficits.    Total time of 41 minutes spent both reviewing his chart and with face-to-face discussion and examination.    Metta Clines, DO   Subjective:  Timothy [name] is a 72 year old male with HTN, prediabetes, OSA on CPAP, IBS and Bipolar Disorder who follows up with unsteady gait and lower extremity weakness.   UPDATE:    HISTORY: He had left total hip replacement in February 2020.  Following the procedure, he continued to experience left hip pain, causing him to limp.  He slowly improved but after a few months gradually felt weaker again.  He has some localized back pain but denies numbness and pain in the legs.  He is able to stand up from a chair but reports effort.  When he went snorkeling last summer, he had trouble kicking his legs.  Denies change in bowel or bladder function.  Denies perineal numbness.  Upper extremities feel fine.  Denies double vision, neck weakness, dysphagia or dyspnea.  He returned to his orthopedist, who told him that his hip was okay.  He was referred to neurosurgeon who told him that he had  degenerative disc disease and recommended physical therapy but that surgery was not indicated.  He continues to have low back pain and left hip pain, which he treats with Aleve.  He feels both legs are weak, left greater than right.  No numbness or radicular pain down the leg.  No bowel or bladder dysfunction.  He received L5-S1 interlaminar injection in December 2021 without relief.  CT lumbar myelogram on 10/02/2020 personally reviewed multilevel degenerative disc disease with left foraminal stenosis at L3-4, bilateral foraminal stenosis at L4-5 (affecting either L4 nerve) and right foraminal stenosis at L5-S1 (potentially affecting L5 nerve).  NCV-EMG of bilateral lower extremities on 12/01/2020 showed mild chronic right L5 radiculopathy but otherwise unremarkable.  MRI of cervical and thoracic spine on 04/20/2021 showed degenerative changes but no spinal canal stenosis causing myelopathy.  MRI of brain on 04/29/2021 showed incidental remote small infarct in the medial right parietal lobe and within the posterior left frontal white matter but no explanation for his subjective bilateral lower extremity weakness.  He went to [facility] where they repeated a NCV-EMG on 09/29/2021 which was negative.  Symptoms are getting worse.  Reports edema in the right leg and numbness in right foot.  The numbness is so severe he cannot feel the pedal of the car. If he has his legs up, he feels a pain down the lateral leg.    PAST MEDICAL HISTORY: Past Medical History:  Diagnosis Date   Arthritis    Bipolar disorder ([place])  controlled by meds since 1997   Cancer ([place])    basal cell and pre melanoma on back   Neuromuscular disorder ([place])    gait and ambulation problems, but no diagnosis from Neurology   Pneumonia    Sleep apnea    cpap   Wears glasses    reading    MEDICATIONS: Current Outpatient Medications on File Prior to Visit  Medication Sig Dispense Refill   Ascorbic Acid (VITAMIN C PO) Take 1 tablet by mouth  daily.     aspirin EC 81 MG tablet Take 81 mg by mouth daily. Swallow whole.     bisoprolol-hydrochlorothiazide (ZIAC) 5-6.25 MG tablet TAKE 1 TABLET BY MOUTH EVERY DAY FOR BLOOD PRESSURE 90 tablet 3   Cholecalciferol (VITAMIN D-3) 125 MCG (5000 UT) TABS Take 5,000 Units by mouth every evening.      Cyanocobalamin (VITAMIN B-12) 1000 MCG SUBL Take 1 tablet SL Daily (Patient taking differently: Take 1,000 mcg by mouth daily.)  0   divalproex (DEPAKOTE) 250 MG DR tablet Take 2 tablets (500 mg total) by mouth 2 (two) times daily. 360 tablet 3   ondansetron (ZOFRAN) 4 MG tablet Take 1 tablet (4 mg total) by mouth every 8 (eight) hours as needed for vomiting or nausea. 20 tablet 0   traZODone (DESYREL) 50 MG tablet TAKE 1/2 TO 1 TABLET BY MOUTH 3 TIMES A DAY AS DIRECTED 270 tablet 1   No current facility-administered medications on file prior to visit.    ALLERGIES: Allergies  Allergen Reactions   Ciprofloxacin Diarrhea    FAMILY HISTORY: Family History  Adopted: Yes      Objective:   General: No acute distress.  Patient appears well-groomed.   Head:  Normocephalic/atraumatic Eyes:  Fundi examined but not visualized Neck: supple, no paraspinal tenderness, full range of motion Heart:  Regular rate and rhythm Lungs:  Clear to auscultation bilaterally Back: No paraspinal tenderness Neurological Exam: alert and oriented to person, place, and time.  Speech fluent and not dysarthric, language intact.  CN II-XII intact. Bulk and tone normal, muscle strength 5/5 throughout.  Intention tremor bilaterally.  Sensation pinprick reduced over dorsum and bottom of right foot up the shin and lateral leg to below the knee.  Vibratory sensation intact  Deep tendon reflexes 2+ throughout, toes downgoing.  Finger to nose testing intact.  Gait with right limp.  Romberg negative.   Metta Clines, DO  CC: [name], MD

## 2022-09-01 ENCOUNTER — Encounter: Payer: Self-pay | Admitting: Neurology

## 2022-09-01 ENCOUNTER — Ambulatory Visit (INDEPENDENT_AMBULATORY_CARE_PROVIDER_SITE_OTHER): Payer: PPO | Admitting: Neurology

## 2022-09-01 VITALS — BP 120/70 | HR 98 | Ht 69.0 in | Wt 172.0 lb

## 2022-09-01 DIAGNOSIS — R251 Tremor, unspecified: Secondary | ICD-10-CM | POA: Diagnosis not present

## 2022-09-01 DIAGNOSIS — R2689 Other abnormalities of gait and mobility: Secondary | ICD-10-CM | POA: Diagnosis not present

## 2022-09-01 DIAGNOSIS — G8929 Other chronic pain: Secondary | ICD-10-CM

## 2022-09-01 DIAGNOSIS — M545 Low back pain, unspecified: Secondary | ICD-10-CM | POA: Diagnosis not present

## 2022-09-01 NOTE — Patient Instructions (Signed)
Refer to physical therapy for low back pain and gait instability - [facility] Will order a DaT scan to evaluate for some underlying Parkinsonian condition Further recommendations pending results.  Follow up after testing. Let me know if you would like to see podiatry regarding foot pain.

## 2022-09-02 ENCOUNTER — Telehealth: Payer: Self-pay

## 2022-09-02 NOTE — Telephone Encounter (Signed)
[employer] form filled out and faxed [phone]

## 2022-09-06 DIAGNOSIS — M25611 Stiffness of right shoulder, not elsewhere classified: Secondary | ICD-10-CM | POA: Diagnosis not present

## 2022-09-06 DIAGNOSIS — M25511 Pain in right shoulder: Secondary | ICD-10-CM | POA: Diagnosis not present

## 2022-09-06 NOTE — Telephone Encounter (Signed)
NO PA required, $200 copay.

## 2022-09-09 DIAGNOSIS — M25511 Pain in right shoulder: Secondary | ICD-10-CM | POA: Diagnosis not present

## 2022-09-09 DIAGNOSIS — M25611 Stiffness of right shoulder, not elsewhere classified: Secondary | ICD-10-CM | POA: Diagnosis not present

## 2022-09-13 DIAGNOSIS — M25511 Pain in right shoulder: Secondary | ICD-10-CM | POA: Diagnosis not present

## 2022-09-13 DIAGNOSIS — M25611 Stiffness of right shoulder, not elsewhere classified: Secondary | ICD-10-CM | POA: Diagnosis not present

## 2022-09-14 ENCOUNTER — Telehealth: Payer: Self-pay | Admitting: Internal Medicine

## 2022-09-14 NOTE — Progress Notes (Signed)
  Chronic Care Management   Note  09/14/2022 Name: Timothy [name] MRN: [id number] DOB: Apr 11, 1951  Timothy [name] is a 72 y.o. year old male who is a primary care patient of [name], MD. I reached out to Smiley Houseman by phone today in response to a referral sent by [name]'s PCP, [name], MD.   [name] was given information about Chronic Care Management services today including:  CCM service includes personalized support from designated clinical staff supervised by his physician, including individualized plan of care and coordination with other care providers 24/7 contact phone numbers for assistance for urgent and routine care needs. Service will only be billed when office clinical staff spend 20 minutes or more in a month to coordinate care. Only one practitioner may furnish and bill the service in a calendar month. The patient may stop CCM services at any time (effective at the end of the month) by phone call to the office staff.   Patient agreed to services and verbal consent obtained.   Follow up plan:   [employer] manager, [employer] food

## 2022-09-14 NOTE — Progress Notes (Signed)
  Chronic Care Management   Outreach Note  09/14/2022 Name: Timothy [name] MRN: [id number] DOB: [birth year]  Referred by: [name], MD Reason for referral : No chief complaint on file.   An unsuccessful telephone outreach was attempted today. The patient was referred to the pharmacist for assistance with care management and care coordination.   Follow Up Plan:   Tatjana Dellinger [employer] Scheduler

## 2022-09-15 DIAGNOSIS — M25611 Stiffness of right shoulder, not elsewhere classified: Secondary | ICD-10-CM | POA: Diagnosis not present

## 2022-09-15 DIAGNOSIS — M25511 Pain in right shoulder: Secondary | ICD-10-CM | POA: Diagnosis not present

## 2022-09-20 DIAGNOSIS — M25511 Pain in right shoulder: Secondary | ICD-10-CM | POA: Diagnosis not present

## 2022-09-20 DIAGNOSIS — M25611 Stiffness of right shoulder, not elsewhere classified: Secondary | ICD-10-CM | POA: Diagnosis not present

## 2022-09-22 DIAGNOSIS — M25611 Stiffness of right shoulder, not elsewhere classified: Secondary | ICD-10-CM | POA: Diagnosis not present

## 2022-09-22 DIAGNOSIS — M25511 Pain in right shoulder: Secondary | ICD-10-CM | POA: Diagnosis not present

## 2022-09-23 ENCOUNTER — Encounter (HOSPITAL_COMMUNITY)
Admission: RE | Admit: 2022-09-23 | Discharge: 2022-09-23 | Disposition: A | Discharge status: Home / Self Care | Payer: PPO | Source: Ambulatory Visit | Attending: Neurology | Admitting: Neurology

## 2022-09-23 DIAGNOSIS — R251 Tremor, unspecified: Secondary | ICD-10-CM | POA: Diagnosis not present

## 2022-09-23 DIAGNOSIS — R269 Unspecified abnormalities of gait and mobility: Secondary | ICD-10-CM | POA: Diagnosis not present

## 2022-09-23 MED ORDER — POTASSIUM IODIDE (ANTIDOTE) 130 MG PO TABS: 130.0000 mg | ORAL_TABLET | Freq: Once | ORAL | Status: DC

## 2022-09-23 MED ORDER — IOFLUPANE I 123 185 MBQ/2.5ML IV SOLN
4.7000 | Freq: Once | INTRAVENOUS | Status: AC | PRN
  Administered 2022-09-23: 4.7 via INTRAVENOUS
  Filled 2022-09-23: qty 5

## 2022-09-23 MED ORDER — POTASSIUM IODIDE (ANTIDOTE) 130 MG PO TABS
ORAL_TABLET | ORAL | Status: AC
  Filled 2022-09-23: qty 1

## 2022-09-25 NOTE — Patient Instructions (Signed)

## 2022-09-25 NOTE — Progress Notes (Unsigned)
Annual  Screening/Preventative Visit  & Comprehensive Evaluation & Examination   Future Appointments  Date Time Provider Department  09/26/2022                       cpe  2:00 PM [name], MD [facility]  10/20/2022  2:00 PM Cottle, Steva Ready., MD [facility]  11/01/2022  1:00 PM Butch Penny, NP [facility]  06/12/2023                      wellness 11:00 AM Raynelle Dick, NP [facility]  09/26/2023                      cpe  2:00 PM [name], MD [facility]             This very nice 72 y.o.  DWM with HTN, HLD, Prediabetes and Vitamin D Deficiency  presents for a Screening /Preventative Visit & comprehensive evaluation and management of multiple medical co-morbidities. Patient still follows with Dr Jennelle Human for Bipolar Disorder. Patient also is still on CPAP for OSA       Patient  still has ongoing issues with gait with bilateral LE weakness (Rt>Lt)  which he relates following Lt THA in Feb 2020.  Today he relates that he didn't notice his weakness until May or June 2020. Patient has been evaluated extensively by Neurology - Dr Everlena Cooper with negative Lumbar CT Myelogram, Negative cervical & thoracic spine MRI, Negative Brain MRI and Negative PNCV- EMG. Patient has been tried with physical therapy w/o any perceived benefit. Patient had evaluation at [facility] - Dr [place] Mayberry with Normal PNCVs in 2023. Recent DaTscan per Dr Everlena Cooper was equivocal .         HTN predates since 2012. Patient's BP has been controlled at home.  Today's BP is at goal - 130/72 . Patient denies any cardiac symptoms as chest pain, palpitations, shortness of breath, dizziness or ankle swelling.       Patient's hyperlipidemia is controlled with diet . Last lipids were at goal :  Lab Results  Component Value Date   CHOL 164 06/27/2022   HDL 56 06/27/2022   LDLCALC 85 06/27/2022   TRIG 126 06/27/2022   CHOLHDL 2.9 06/27/2022         Patient is monitored expectantly for glucose intolerance and  patient denies reactive hypoglycemic symptoms, visual blurring, diabetic polys or paresthesias. Last A1c was at goal :   Lab Results  Component Value Date   HGBA1C 5.4 06/27/2022         Finally, patient has history of Vitamin D Deficiency ("28" /2017) and last vitamin D was at goal :   Lab Results  Component Value Date   VD25OH 77 06/27/2022       Current Outpatient Medications on File Prior to Visit  Medication Sig   VITAMIN C  Take 1 tablet daily.   aspirin EC 81 MG tablet Take  daily   bisoprolol-hctz 5-6.25 MG tablet TAKE 1 TABLET EVERY DAY    VITAMIN D  5,000  u Take  every evening.    DEPAKOTE 250 MG DR  TAKE 2 TABLETS 2 TIMES DAILY.     Allergies  Allergen Reactions   Ciprofloxacin Diarrhea     Past Medical History:  Diagnosis Date   Arthritis    Bipolar disorder (HCC)    controlled by meds since 1997   Pneumonia  Sleep apnea    cpap   Wears glasses    reading     Health Maintenance  Topic Date Due   Zoster Vaccines- Shingrix (1 of 2) Never done   COVID-19 Vaccine (4 - Booster) 05/08/2020   TETANUS/TDAP  11/07/2027   Pneumonia Vaccine 26+ Years old  Completed   Hepatitis C Screening  Completed   HPV VACCINES  Aged Out   INFLUENZA VACCINE  Discontinued     Immunization History  Administered Date(s) Administered   Moderna Sars-Covid-2 Vacc 07/02/2019, 07/30/2019   PFIZER SARS-COV-2 Vacc 03/13/2020   Pneumococcal -13 04/05/2017   Pneumococcal -23 11/22/2018   Td 11/06/2017    Last Colon -  10/30/2015 - Dr Elisabeth Most recc 5 yr f/u  due June 2023   Past Surgical History:  Procedure Laterality Date   APPENDECTOMY     COLONOSCOPY     KNEE ARTHROSCOPY W/ MENISCAL REPAIR  2004   left   ORIF HUMERUS FRACTURE Left 09/19/2013   LEFT OPEN REDUCTION INTERNAL FIXATION (ORIF) PROXIMAL HUMERUS; ROTATOR CUFF REPAIR;  Loreta Ave, MD   TONSILLECTOMY     TOTAL HIP ARTHROPLASTY Left 07/18/2018   TOTAL HIP ARTHROPLASTY ANTERIOR APPROACH  Swinteck,  Arlys John, MD     Family History  Adopted: Yes     Social History   Socioeconomic History   Marital status: Married      Spouse name: Jasmine December    Number of children: 4 children & 11 Grandchildren    Occupational History   Attorney   Tobacco Use   Smoking status: Never   Smokeless tobacco: Never  Vaping Use   Vaping Use: Never used  Substance Use Topics   Alcohol use: Yes    Comment: 5 days a week   Drug use: No      ROS Constitutional: Denies fever, chills, weight loss/gain, headaches, insomnia,  night sweats or change in appetite. Does c/o fatigue. Eyes: Denies redness, blurred vision, diplopia, discharge, itchy or watery eyes.  ENT: Denies discharge, congestion, post nasal drip, epistaxis, sore throat, earache, hearing loss, dental pain, Tinnitus, Vertigo, Sinus pain or snoring.  Cardio: Denies chest pain, palpitations, irregular heartbeat, syncope, dyspnea, diaphoresis, orthopnea, PND, claudication or edema Respiratory: denies cough, dyspnea, DOE, pleurisy, hoarseness, laryngitis or wheezing.  Gastrointestinal: Denies dysphagia, heartburn, reflux, water brash, pain, cramps, nausea, vomiting, bloating, diarrhea, constipation, hematemesis, melena, hematochezia, jaundice or hemorrhoids Genitourinary: Denies dysuria, frequency, discharge, hematuria or flank pain. Has urgency, nocturia x 2-3 & occasional hesitancy. Musculoskeletal: Denies arthralgia, myalgia, stiffness, Jt. Swelling, pain, limp or strain/sprain. Denies Falls. Skin: Denies puritis, rash, hives, warts, acne, eczema or change in skin lesion Neuro: No weakness, tremor, incoordination, spasms, paresthesia or pain Psychiatric: Denies confusion, memory loss or sensory loss. Denies Depression. Endocrine: Denies change in weight, skin, hair change, nocturia, and paresthesia, diabetic polys, visual blurring or hyper / hypo glycemic episodes.  Heme/Lymph: No excessive bleeding, bruising or enlarged lymph nodes.   Physical  Exam  BP 130/72   Pulse 76   Temp 98.2 F (36.8 C)   Ht 5\' 8"  (1.727 m)   Wt 178 lb 9.6 oz (81 kg)   SpO2 97%   BMI 27.16 kg/m   General Appearance: Well nourished and well groomed and in no apparent distress.  Eyes: PERRLA, EOMs, conjunctiva no swelling or erythema, normal fundi and vessels. Sinuses: No frontal/maxillary tenderness ENT/Mouth: EACs patent / TMs  nl. Nares clear without erythema, swelling, mucoid exudates. Oral hygiene is good. No  erythema, swelling, or exudate. Tongue normal, non-obstructing. Tonsils not swollen or erythematous. Hearing normal.  Neck: Supple, thyroid not palpable. No bruits, nodes or JVD. Respiratory: Respiratory effort normal.  BS equal and clear bilateral without rales, rhonci, wheezing or stridor. Cardio: Heart sounds are normal with regular rate and rhythm and no murmurs, rubs or gallops. Peripheral pulses are normal and equal bilaterally without edema. No aortic or femoral bruits. Chest: symmetric with normal excursions and percussion.  Abdomen: Soft, with Nl bowel sounds. Nontender, no guarding, rebound, hernias, masses, or organomegaly.  Lymphatics: Non tender without lymphadenopathy.  Musculoskeletal: Full ROM all peripheral extremities and and sl broad based gait. Poor or unstable tandem walk. Cannot balance on either foot. No cog-wheeling.  Skin: Warm and dry without rashes, lesions, cyanosis, clubbing or  ecchymosis.  Neuro: Cranial nerves intact, reflexes equal bilaterally. Normal muscle tone, no cerebellar symptoms. (-) Romberg. No tremor. Nl F- >Nl.    Sensation intact.  Pysch: Alert and oriented x 3 with normal affect, insight and judgment appropriate.   Assessment and Plan  1. Annual Preventative/Screening Exam    2. Essential hypertension  - EKG 12-Lead - [place], RETROPERITNL ABD,  LTD - Urinalysis, Routine w reflex microscopic - Microalbumin / creatinine urine ratio - CBC with Differential/Platelet - COMPLETE METABOLIC PANEL WITH  GFR - Magnesium - TSH  3. Hyperlipidemia, mixed  - EKG 12-Lead - [place], RETROPERITNL ABD,  LTD - Lipid panel - TSH  4. Abnormal glucose  - EKG 12-Lead - [place], RETROPERITNL ABD,  LTD - Hemoglobin A1c - Insulin, random  5. Vitamin D deficiency  - VITAMIN D 25 Hydroxy   6. B12 deficiency  - Vitamin B12  7. OSA on CPAP   8. BPH with obstruction/lower urinary tract symptoms  - Patient desires to try Tamsulosin for his LUTS.   - PSA  9. Screening for colorectal cancer  - POC Hemoccult Bld/Stl   10. Prostate cancer screening  - PSA  11. Screening for ischemic heart disease  - EKG 12-Lead  12. Screening for AAA (aortic abdominal aneurysm)  - [place], RETROPERITNL ABD,  LTD  13. Right lumbar radiculopathy   14. Medication management  - Urinalysis, Routine w reflex microscopic - Microalbumin / creatinine urine ratio - CBC with Differential/Platelet - COMPLETE METABOLIC PANEL WITH GFR - Magnesium - Lipid panel - TSH - Hemoglobin A1c - Insulin, random - VITAMIN D 25 Hydroxy         Patient was counseled in prudent diet, weight control to achieve/maintain BMI less than 25, BP monitoring, regular exercise and medications as discussed.  Discussed med effects and SE's. Routine screening labs and tests as requested with regular follow-up as recommended. Over 40 minutes of exam, counseling, chart review and high complex critical decision making was performed   Marinus Maw, MD

## 2022-09-26 ENCOUNTER — Ambulatory Visit (INDEPENDENT_AMBULATORY_CARE_PROVIDER_SITE_OTHER): Payer: PPO | Admitting: Internal Medicine

## 2022-09-26 ENCOUNTER — Encounter: Payer: Self-pay | Admitting: Internal Medicine

## 2022-09-26 VITALS — BP 130/72 | HR 76 | Temp 98.2°F | Ht 68.0 in | Wt 178.6 lb

## 2022-09-26 DIAGNOSIS — Z79899 Other long term (current) drug therapy: Secondary | ICD-10-CM

## 2022-09-26 DIAGNOSIS — Z1211 Encounter for screening for malignant neoplasm of colon: Secondary | ICD-10-CM

## 2022-09-26 DIAGNOSIS — Z125 Encounter for screening for malignant neoplasm of prostate: Secondary | ICD-10-CM | POA: Diagnosis not present

## 2022-09-26 DIAGNOSIS — E559 Vitamin D deficiency, unspecified: Secondary | ICD-10-CM | POA: Diagnosis not present

## 2022-09-26 DIAGNOSIS — R42 Dizziness and giddiness: Secondary | ICD-10-CM

## 2022-09-26 DIAGNOSIS — R7309 Other abnormal glucose: Secondary | ICD-10-CM

## 2022-09-26 DIAGNOSIS — E782 Mixed hyperlipidemia: Secondary | ICD-10-CM | POA: Diagnosis not present

## 2022-09-26 DIAGNOSIS — R269 Unspecified abnormalities of gait and mobility: Secondary | ICD-10-CM

## 2022-09-26 DIAGNOSIS — Z0000 Encounter for general adult medical examination without abnormal findings: Secondary | ICD-10-CM

## 2022-09-26 DIAGNOSIS — N138 Other obstructive and reflux uropathy: Secondary | ICD-10-CM

## 2022-09-26 DIAGNOSIS — N401 Enlarged prostate with lower urinary tract symptoms: Secondary | ICD-10-CM | POA: Diagnosis not present

## 2022-09-26 DIAGNOSIS — Z0001 Encounter for general adult medical examination with abnormal findings: Secondary | ICD-10-CM

## 2022-09-26 DIAGNOSIS — I10 Essential (primary) hypertension: Secondary | ICD-10-CM | POA: Diagnosis not present

## 2022-09-26 DIAGNOSIS — Z136 Encounter for screening for cardiovascular disorders: Secondary | ICD-10-CM

## 2022-09-26 MED ORDER — MECLIZINE HCL 25 MG PO TABS: ORAL_TABLET | ORAL | 11 refills | Status: AC

## 2022-09-27 ENCOUNTER — Telehealth: Payer: Self-pay

## 2022-09-27 ENCOUNTER — Telehealth: Payer: Self-pay | Admitting: Neurology

## 2022-09-27 DIAGNOSIS — M25611 Stiffness of right shoulder, not elsewhere classified: Secondary | ICD-10-CM | POA: Diagnosis not present

## 2022-09-27 DIAGNOSIS — M25511 Pain in right shoulder: Secondary | ICD-10-CM | POA: Diagnosis not present

## 2022-09-27 LAB — VITAMIN D 25 HYDROXY (VIT D DEFICIENCY, FRACTURES): Vit D, 25-Hydroxy: 92 ng/mL (ref 30–100)

## 2022-09-27 LAB — COMPLETE METABOLIC PANEL WITH GFR
AG Ratio: 1.5 (calc) (ref 1.0–2.5)
ALT: 19 U/L (ref 9–46)
AST: 20 U/L (ref 10–35)
Albumin: 4.2 g/dL (ref 3.6–5.1)
Alkaline phosphatase (APISO): 52 U/L (ref 35–144)
BUN/Creatinine Ratio: 30 (calc) — ABNORMAL HIGH (ref 6–22)
BUN: 30 mg/dL — ABNORMAL HIGH (ref 7–25)
CO2: 30 mmol/L (ref 20–32)
Calcium: 10 mg/dL (ref 8.6–10.3)
Chloride: 102 mmol/L (ref 98–110)
Creat: 1.01 mg/dL (ref 0.70–1.28)
Globulin: 2.8 g/dL (calc) (ref 1.9–3.7)
Glucose, Bld: 93 mg/dL (ref 65–99)
Potassium: 4.5 mmol/L (ref 3.5–5.3)
Sodium: 139 mmol/L (ref 135–146)
Total Bilirubin: 0.3 mg/dL (ref 0.2–1.2)
Total Protein: 7 g/dL (ref 6.1–8.1)
eGFR: 79 mL/min/{1.73_m2} (ref >=60)

## 2022-09-27 LAB — CBC WITH DIFFERENTIAL/PLATELET
Absolute Monocytes: 702 {cells}/uL (ref 200–950)
Basophils Absolute: 36 {cells}/uL (ref 0–200)
Basophils Relative: 0.4 %
Eosinophils Absolute: 90 {cells}/uL (ref 15–500)
Eosinophils Relative: 1 %
HCT: 42 % (ref 38.5–50.0)
Hemoglobin: 14.1 g/dL (ref 13.2–17.1)
Lymphs Abs: 2835 {cells}/uL (ref 850–3900)
MCH: 29 pg (ref 27.0–33.0)
MCHC: 33.6 g/dL (ref 32.0–36.0)
MCV: 86.4 fL (ref 80.0–100.0)
MPV: 10.8 fL (ref 7.5–12.5)
Monocytes Relative: 7.8 %
Neutro Abs: 5337 {cells}/uL (ref 1500–7800)
Neutrophils Relative %: 59.3 %
Platelets: 232 10*3/uL (ref 140–400)
RBC: 4.86 Million/uL (ref 4.20–5.80)
RDW: 13.1 % (ref 11.0–15.0)
Total Lymphocyte: 31.5 %
WBC: 9 10*3/uL (ref 3.8–10.8)

## 2022-09-27 LAB — MICROALBUMIN / CREATININE URINE RATIO
Creatinine, Urine: 183 mg/dL (ref 20–320)
Microalb Creat Ratio: 6 mg/g creat (ref <30)
Microalb, Ur: 1.1 mg/dL

## 2022-09-27 LAB — HEMOGLOBIN A1C
Hgb A1c MFr Bld: 5.5 % of total Hgb (ref <5.7)
Mean Plasma Glucose: 111 mg/dL
eAG (mmol/L): 6.2 mmol/L

## 2022-09-27 LAB — TSH: TSH: 1.02 mIU/L (ref 0.40–4.50)

## 2022-09-27 LAB — URINALYSIS, ROUTINE W REFLEX MICROSCOPIC
Bilirubin Urine: NEGATIVE
Glucose, UA: NEGATIVE
Hgb urine dipstick: NEGATIVE
Leukocytes,Ua: NEGATIVE
Nitrite: NEGATIVE
Protein, ur: NEGATIVE
Specific Gravity, Urine: 1.027 (ref 1.001–1.035)
pH: 5.5 (ref 5.0–8.0)

## 2022-09-27 LAB — INSULIN, RANDOM: Insulin: 7.2 u[IU]/mL

## 2022-09-27 LAB — LIPID PANEL
Cholesterol: 166 mg/dL (ref <200)
HDL: 59 mg/dL (ref >=40)
LDL Cholesterol (Calc): 89 mg/dL (calc)
Non-HDL Cholesterol (Calc): 107 mg/dL (calc) (ref <130)
Total CHOL/HDL Ratio: 2.8 (calc) (ref <5.0)
Triglycerides: 89 mg/dL (ref <150)

## 2022-09-27 LAB — PSA: PSA: 1.13 ng/mL (ref <=4.00)

## 2022-09-27 LAB — MAGNESIUM: Magnesium: 2 mg/dL (ref 1.5–2.5)

## 2022-09-27 NOTE — Telephone Encounter (Signed)
Patient called and LM that he is returning a call to someone, he thinks its about an appt

## 2022-09-27 NOTE — Telephone Encounter (Signed)
Patient advised of his DATScan.   Patient agree to the Prescription Carbidopa-Levodpa

## 2022-09-27 NOTE — Telephone Encounter (Signed)
-----   Message from Drema Dallas, DO sent at 09/26/2022 12:19 PM EDT ----- DaT scan is equivocal - there are findings that may suggest Parkinson's, but very mild and could be of no clinical significance.  I would like for him to schedule a follow up in 6 months.  We can continue to monitor.  Alternatively, we can start a Parkinson's medication called carbidopa-levodopa and see if his walking improves.

## 2022-09-27 NOTE — Progress Notes (Signed)
LMOVM for patient to call the office back.

## 2022-09-27 NOTE — Telephone Encounter (Signed)
-----   Message from Adam R Jaffe, DO sent at 09/26/2022 12:19 PM EDT ----- DaT scan is equivocal - there are findings that may suggest Parkinson's, but very mild and could be of no clinical significance.  I would like for him to schedule a follow up in 6 months.  We can continue to monitor.  Alternatively, we can start a Parkinson's medication called carbidopa-levodopa and see if his walking improves. 

## 2022-09-27 NOTE — Progress Notes (Signed)
^<^<^<^<^<^<^<^<^<^<^<^<^<^<^<^<^<^<^<^<^<^<^<^<^<^<^<^<^<^<^<^<^<^<^<^<^ ^>^>^>^>^>^>^>^>^>^>^>>^>^>^>^>^>^>^>^>^>^>^>^>^>^>^>^>^>^>^>^>^>^>^>^>^ -  Test results slightly outside the reference range are not unusual. If there is anything important, I will review this with you,  otherwise it is considered normal test values.  If you have further questions,  please do not hesitate to contact me at the office or via My Chart.  ^<^<^<^<^<^<^<^<^<^<^<^<^<^<^<^<^<^<^<^<^<^<^<^<^<^<^<^<^<^<^<^<^<^<^<^<^ ^>^>^>^>^>^>^>^>^>^>^>^>^>^>^>^>^>^>^>^>^>^>^>^>^>^>^>^>^>^>^>^>^>^>^>^>^  -  PSA - Low - No Prostate Cancer - Great  ! ^<^<^<^<^<^<^<^<^<^<^<^<^<^<^<^<^<^<^<^<^<^<^<^<^<^<^<^<^<^<^<^<^<^<^<^<^ ^>^>^>^>^>^>^>^>^>^>^>^>^>^>^>^>^>^>^>^>^>^>^>^>^>^>^>^>^>^>^>^>^>^>^>^>^  - Total Chol = 166   and LDL Chol = 89 -- Both   Excellent   - Very low risk for Heart Attack  / Stroke  ^<^<^<^<^<^<^<^<^<^<^<^<^<^<^<^<^<^<^<^<^<^<^<^<^<^<^<^<^<^<^<^<^<^<^<^<^ ^>^>^>^>^>^>^>^>^>^>^>^>^>^>^>^>^>^>^>^>^>^>^>^>^>^>^>^>^>^>^>^>^>^>^>^>^  -  Thyroid Normal   ^<^<^<^<^<^<^<^<^<^<^<^<^<^<^<^<^<^<^<^<^<^<^<^<^<^<^<^<^<^<^<^<^<^<^<^<^ ^>^>^>^>^>^>^>^>^>^>^>^>^>^>^>^>^>^>^>^>^>^>^>^>^>^>^>^>^>^>^>^>^>^>^>^>^  - A1c - Normal - No Diabetes  - Great !   ^<^<^<^<^<^<^<^<^<^<^<^<^<^<^<^<^<^<^<^<^<^<^<^<^<^<^<^<^<^<^<^<^<^<^<^<^ ^>^>^>^>^>^>^>^>^>^>^>^>^>^>^>^>^>^>^>^>^>^>^>^>^>^>^>^>^>^>^>^>^>^>^>^>^  - Vitamin D = 92 - Excellent - Please keep dose same   ^<^<^<^<^<^<^<^<^<^<^<^<^<^<^<^<^<^<^<^<^<^<^<^<^<^<^<^<^<^<^<^<^<^<^<^<^ ^>^>^>^>^>^>^>^>^>^>^>^>^>^>^>^>^>^>^>^>^>^>^>^>^>^>^>^>^>^>^>^>^>^>^>^>^  - All Else - CBC - Kidneys - Electrolytes - Liver - Magnesium & Thyroid    - all  Normal / OK ^<^<^<^<^<^<^<^<^<^<^<^<^<^<^<^<^<^<^<^<^<^<^<^<^<^<^<^<^<^<^<^<^<^<^<^<^ ^>^>^>^>^>^>^>^>^>^>^>^>^>^>^>^>^>^>^>^>^>^>^>^>^>^>^>^>^>^>^>^>^>^>^>^>^  - Keep up the [place] Work !    ^<^<^<^<^<^<^<^<^<^<^<^<^<^<^<^<^<^<^<^<^<^<^<^<^<^<^<^<^<^<^<^<^<^<^<^<^ ^>^>^>^>^>^>^>^>^>^>^>^>^>^>^>^>^>^>^>^>^>^>^>^>^>^>^>^>^>^>^>^>^>^>^>^>

## 2022-09-28 ENCOUNTER — Other Ambulatory Visit: Payer: Self-pay | Admitting: Neurology

## 2022-09-28 MED ORDER — CARBIDOPA-LEVODOPA 25-100 MG PO TABS: ORAL_TABLET | ORAL | 0 refills | Status: DC

## 2022-09-28 NOTE — Telephone Encounter (Signed)
DaT scan is equivocal - there are findings that may suggest Parkinson's, but very mild and could be of no clinical significance.  I would like for him to schedule a follow up in 6 months.  We can continue to monitor.  Alternatively, we can start a Parkinson's medication called carbidopa-levodopa and see if his walking improves.

## 2022-09-28 NOTE — Telephone Encounter (Signed)
Patient advised by phone and mychart message of Dr.Jaffe note, Prescription for carbidopa-levodopa sent to [facility] on [address].  Take as directed.  Take the medication at the same time everyday. The medication does not get absorbed into your body as well, if you take it with protein-containing foods (meat, dairy, beans). Try taking the medication no sooner than one hour before meals. If you experience nausea by taking it on an empty stomach, you may take it with carbohydrate-containing food,such as bread or crackers. Side effects to look out for, include dizziness, nausea, vivid dreams and hallucinations. If you experience any of these symptoms, please call us.

## 2022-09-30 DIAGNOSIS — M25511 Pain in right shoulder: Secondary | ICD-10-CM | POA: Diagnosis not present

## 2022-09-30 DIAGNOSIS — M25611 Stiffness of right shoulder, not elsewhere classified: Secondary | ICD-10-CM | POA: Diagnosis not present

## 2022-10-04 DIAGNOSIS — Z96611 Presence of right artificial shoulder joint: Secondary | ICD-10-CM | POA: Diagnosis not present

## 2022-10-09 ENCOUNTER — Other Ambulatory Visit: Payer: Self-pay | Admitting: Neurology

## 2022-10-10 ENCOUNTER — Telehealth: Payer: Self-pay | Admitting: Neurology

## 2022-10-10 NOTE — Telephone Encounter (Signed)
LMOVM for patient, I am not treating his foot pain.  I had referred him to podiatry.  Otherwise, he needs to follow up with his PCP

## 2022-10-10 NOTE — Telephone Encounter (Signed)
Pt called in and left a message with the access nurse. Pt stated he has serious right foot pain. Caller states he would like some pain medication.  Full access nurse report is in Dr. Moises Blood box

## 2022-10-11 ENCOUNTER — Ambulatory Visit (INDEPENDENT_AMBULATORY_CARE_PROVIDER_SITE_OTHER): Payer: PPO | Admitting: Nurse Practitioner

## 2022-10-11 ENCOUNTER — Encounter: Payer: Self-pay | Admitting: Nurse Practitioner

## 2022-10-11 VITALS — BP 118/68 | HR 65 | Temp 98.2°F | Ht 68.0 in | Wt 179.6 lb

## 2022-10-11 DIAGNOSIS — M4150 Other secondary scoliosis, site unspecified: Secondary | ICD-10-CM

## 2022-10-11 DIAGNOSIS — R202 Paresthesia of skin: Secondary | ICD-10-CM

## 2022-10-11 DIAGNOSIS — R269 Unspecified abnormalities of gait and mobility: Secondary | ICD-10-CM | POA: Diagnosis not present

## 2022-10-11 DIAGNOSIS — Z96642 Presence of left artificial hip joint: Secondary | ICD-10-CM

## 2022-10-11 DIAGNOSIS — H00011 Hordeolum externum right upper eyelid: Secondary | ICD-10-CM

## 2022-10-11 DIAGNOSIS — R200 Anesthesia of skin: Secondary | ICD-10-CM | POA: Diagnosis not present

## 2022-10-11 DIAGNOSIS — M79604 Pain in right leg: Secondary | ICD-10-CM | POA: Diagnosis not present

## 2022-10-11 MED ORDER — ERYTHROMYCIN 5 MG/GM OP OINT: TOPICAL_OINTMENT | OPHTHALMIC | 0 refills | Status: DC

## 2022-10-11 MED ORDER — GABAPENTIN 100 MG PO CAPS: 100.0000 mg | ORAL_CAPSULE | Freq: Three times a day (TID) | ORAL | 0 refills | Status: DC

## 2022-10-11 NOTE — Progress Notes (Signed)
Assessment and Plan:  Timothy [name] was seen today for an episodic visit.  Diagnoses and all order for this visit:  Numbness and tingling of right lower extremity Start Gabapentin as directed Continue to monitor for improvement Contact office if no response to  medication for dosage management.  - gabapentin (NEURONTIN) 100 MG capsule; Take 1 capsule (100 mg total) by mouth 3 (three) times daily.  Dispense: 90 capsule; Refill: 0  Right leg pain Continue to monitor  Degenerative scoliosis in adult patient Continue to follow with Orthopedics  Status post total replacement of left hip Right leg pain possibly related to overuse and gait instability of left hip replacement. May benefit from PT - continue to monitor.   Gait disorder Support device/walker recommend in the past - patient not currently using. Continue to monitor  Hordeolum externum of right upper eyelid Warm compress several times throughout the day. Start Erythromycin ointment as directed Contact office if s/s fail to improve.  - erythromycin ophthalmic ointment; Apply 1 cm ribbon in right eye TID x 7 days.  Dispense: 1 g; Refill: 0  Notify office for further evaluation and treatment, questions or concerns if s/s fail to improve. The risks and benefits of my recommendations, as well as other treatment options were discussed with the patient today. Questions were answered.  Further disposition pending results of labs. Discussed med's effects and SE's.    Over 20 minutes of exam, counseling, chart review, and critical decision making was performed.   Future Appointments  Date Time Provider Department Center  10/20/2022  2:00 PM Cottle, Steva Ready., MD [facility] None  11/01/2022  1:00 PM Butch Penny, NP [facility] None  03/28/2023  2:30 PM [name], MD [facility] None  06/12/2023 11:00 AM Raynelle Dick, NP [facility] None  09/26/2023  2:00 PM [name], MD [facility] None     ------------------------------------------------------------------------------------------------------------------   HPI BP 118/68   Pulse 65   Temp 98.2 F (36.8 C)   Ht 5\' 8"  (1.727 m)   Wt 179 lb 9.6 oz (81.5 kg)   SpO2 98%   BMI 27.31 kg/m   72 y.o.male presents for evaluation of RLE pain with associated numbness and tingling.  Pain has been persistent over the last week, however increased in intensity 3 days ago.  He woke up yesterday with the inability to walk up the steps to work.  Pain has since subsided but still continues to be present, most notably as numbness and tingling, followed by an electrical shock.  Pain originated at the medial malleolus area, radiated up to calf and is now concentrated in the shin.    OV Note 09/26/22:  Patient  still has ongoing issues with gait with bilateral LE weakness (Rt>Lt)  which he relates following Lt THA in Feb 2020.  Today he relates that he didn't notice his weakness until May or June 2020. Patient has been evaluated extensively by Neurology - Dr Everlena Cooper with negative Lumbar CT Myelogram, Negative cervical & thoracic spine MRI, Negative Brain MRI and Negative PNCV- EMG. Patient has been tried with physical therapy w/o any perceived benefit. Patient had evaluation at [facility] - Dr [place] Mayberry with Normal PNCVs in 2023. Recent DaTscan per Dr Everlena Cooper was equivocal .    Patient shares today that he may be diagnosed with Parkinson's.  He has been started on Sinemet IR and has been taking for one week.  Has right upper eye lid stye x 3 days. Associated pain and redness,  tenderness to touch. Has not been using warm compress.  Denies drainage, fever, chills, N/V.   Past Medical History:  Diagnosis Date   Arthritis    Bipolar disorder (HCC)    controlled by meds since 1997   Cancer (HCC)    basal cell and pre melanoma on back   Neuromuscular disorder (HCC)    gait and ambulation problems, but no diagnosis from Neurology   Pneumonia     Sleep apnea    cpap   Wears glasses    reading     Allergies  Allergen Reactions   Ciprofloxacin Diarrhea    Current Outpatient Medications on File Prior to Visit  Medication Sig   Ascorbic Acid (VITAMIN C PO) Take 1 tablet by mouth daily.   aspirin EC 81 MG tablet Take 81 mg by mouth daily. Swallow whole.   bisoprolol-hydrochlorothiazide (ZIAC) 5-6.25 MG tablet TAKE 1 TABLET BY MOUTH EVERY DAY FOR BLOOD PRESSURE   carbidopa-levodopa (SINEMET IR) 25-100 MG tablet Take 1/2 tablet twice daily for one week, then 1/2 tablet three times daily for one week, then 1 tablet three times daily.   Cholecalciferol (VITAMIN D-3) 125 MCG (5000 UT) TABS Take 5,000 Units by mouth every evening.    Cyanocobalamin (VITAMIN B-12) 1000 MCG SUBL Take 1 tablet SL Daily (Patient taking differently: Take 1,000 mcg by mouth daily.)   divalproex (DEPAKOTE) 250 MG DR tablet Take 2 tablets (500 mg total) by mouth 2 (two) times daily.   meclizine (ANTIVERT) 25 MG tablet Take 1/2 to 1 tablet 2 to 3 x /day as needed for Dizziness / Vertigo   No current facility-administered medications on file prior to visit.    ROS: all negative except what is noted in the HPI.   Physical Exam:  BP 118/68   Pulse 65   Temp 98.2 F (36.8 C)   Ht 5\' 8"  (1.727 m)   Wt 179 lb 9.6 oz (81.5 kg)   SpO2 98%   BMI 27.31 kg/m   General Appearance: NAD.  Awake, conversant and cooperative. Eyes: Right upper eye lid near medial canthus with approximately 4 mm round raised pustule, erythematous.  PERRLA, EOMs intact.  Sclera white.  Conjunctiva without erythema. Sinuses: No frontal/maxillary tenderness.  No nasal discharge. Nares patent.  ENT/Mouth: Ext aud canals clear.  Bilateral TMs w/DOL and without erythema or bulging. Hearing intact.  Posterior pharynx without swelling or exudate.  Tonsils without swelling or erythema.  Neck: Supple.  No masses, nodules or thyromegaly. Respiratory: Effort is regular with non-labored  breathing. Breath sounds are equal bilaterally without rales, rhonchi, wheezing or stridor.  Cardio: RRR with no MRGs. Brisk peripheral pulses without edema.  Abdomen: Active BS in all four quadrants.  Soft and non-tender without guarding, rebound tenderness, hernias or masses. Lymphatics: Non tender without lymphadenopathy.  Musculoskeletal: Full ROM, 5/5 strength, normal ambulation.  No clubbing or cyanosis. Skin: Appropriate color for ethnicity. Warm without rashes, lesions, ecchymosis, ulcers.  Neuro: CN II-XII grossly normal. Normal muscle tone without cerebellar symptoms and intact sensation.   Psych: AO X 3,  appropriate mood and affect, insight and judgment.     Adela Glimpse, NP 10:55 AM [facility]

## 2022-10-11 NOTE — Patient Instructions (Signed)
Gabapentin Capsules or Tablets ?What is this medication? ?GABAPENTIN (GA ba pen tin) treats nerve pain. It may also be used to prevent and control seizures in people with epilepsy. It works by calming overactive nerves in your body. ?This medicine may be used for other purposes; ask your health care provider or pharmacist if you have questions. ?COMMON BRAND NAME(S): Active-PAC with Gabapentin, Gabarone, Gralise, Neurontin ?What should I tell my care team before I take this medication? ?They need to know if you have any of these conditions: ?Alcohol or substance use disorder ?Kidney disease ?Lung or breathing disease ?Suicidal thoughts, plans, or attempt; a previous suicide attempt by you or a family member ?An unusual or allergic reaction to gabapentin, other medications, foods, dyes, or preservatives ?Pregnant or trying to get pregnant ?Breast-feeding ?How should I use this medication? ?Take this medication by mouth with a glass of water. Follow the directions on the prescription label. You can take it with or without food. If it upsets your stomach, take it with food. Take your medication at regular intervals. Do not take it more often than directed. Do not stop taking except on your care team's advice. ?If you are directed to break the 600 or 800 mg tablets in half as part of your dose, the extra half tablet should be used for the next dose. If you have not used the extra half tablet within 28 days, it should be thrown away. ?A special MedGuide will be given to you by the pharmacist with each prescription and refill. Be sure to read this information carefully each time. ?Talk to your care team about the use of this medication in children. While this medication may be prescribed for children as young as 3 years for selected conditions, precautions do apply. ?Overdosage: If you think you have taken too much of this medicine contact a poison control center or emergency room at once. ?NOTE: This medicine is only for  you. Do not share this medicine with others. ?What if I miss a dose? ?If you miss a dose, take it as soon as you can. If it is almost time for your next dose, take only that dose. Do not take double or extra doses. ?What may interact with this medication? ?Alcohol ?Antihistamines for allergy, cough, and cold ?Certain medications for anxiety or sleep ?Certain medications for depression like amitriptyline, fluoxetine, sertraline ?Certain medications for seizures like phenobarbital, primidone ?Certain medications for stomach problems ?General anesthetics like halothane, isoflurane, methoxyflurane, propofol ?Local anesthetics like lidocaine, pramoxine, tetracaine ?Medications that relax muscles for surgery ?Opioid medications for pain ?Phenothiazines like chlorpromazine, mesoridazine, prochlorperazine, thioridazine ?This list may not describe all possible interactions. Give your health care provider a list of all the medicines, herbs, non-prescription drugs, or dietary supplements you use. Also tell them if you smoke, drink alcohol, or use illegal drugs. Some items may interact with your medicine. ?What should I watch for while using this medication? ?Visit your care team for regular checks on your progress. You may want to keep a record at home of how you feel your condition is responding to treatment. You may want to share this information with your care team at each visit. You should contact your care team if your seizures get worse or if you have any new types of seizures. Do not stop taking this medication or any of your seizure medications unless instructed by your care team. Stopping your medication suddenly can increase your seizures or their severity. ?This medication may cause serious skin   reactions. They can happen weeks to months after starting the medication. Contact your care team right away if you notice fevers or flu-like symptoms with a rash. The rash may be red or purple and then turn into blisters or  peeling of the skin. Or, you might notice a red rash with swelling of the face, lips or lymph nodes in your neck or under your arms. ?Wear a medical identification bracelet or chain if you are taking this medication for seizures. Carry a card that lists all your medications. ?This medication may affect your coordination, reaction time, or judgment. Do not drive or operate machinery until you know how this medication affects you. Sit up or stand slowly to reduce the risk of dizzy or fainting spells. Drinking alcohol with this medication can increase the risk of these side effects. ?Your mouth may get dry. Chewing sugarless gum or sucking hard candy, and drinking plenty of water may help. ?Watch for new or worsening thoughts of suicide or depression. This includes sudden changes in mood, behaviors, or thoughts. These changes can happen at any time but are more common in the beginning of treatment or after a change in dose. Call your care team right away if you experience these thoughts or worsening depression. ?If you become pregnant while using this medication, you may enroll in the [place] Antiepileptic Drug Pregnancy Registry by calling 1-888-233-2334. This registry collects information about the safety of antiepileptic medication use during pregnancy. ?What side effects may I notice from receiving this medication? ?Side effects that you should report to your care team as soon as possible: ?Allergic reactions or angioedema--skin rash, itching, hives, swelling of the face, eyes, lips, tongue, arms, or legs, trouble swallowing or breathing ?Rash, fever, and swollen lymph nodes ?Thoughts of suicide or self harm, worsening mood, feelings of depression ?Trouble breathing ?Unusual changes in mood or behavior in children after use such as difficulty concentrating, hostility, or restlessness ?Side effects that usually do not require medical attention (report to your care team if they continue or are  bothersome): ?Dizziness ?Drowsiness ?Nausea ?Swelling of ankles, feet, or hands ?Vomiting ?This list may not describe all possible side effects. Call your doctor for medical advice about side effects. You may report side effects to FDA at 1-800-FDA-1088. ?Where should I keep my medication? ?Keep out of reach of children and pets. ?Store at room temperature between 15 and 30 degrees C (59 and 86 degrees F). Get rid of any unused medication after the expiration date. ?This medication may cause accidental overdose and death if taken by other adults, children, or pets. ?To get rid of medications that are no longer needed or have expired: ?Take the medication to a medication take-back program. Check with your pharmacy or law enforcement to find a location. ?If you cannot return the medication, check the label or package insert to see if the medication should be thrown out in the garbage or flushed down the toilet. If you are not sure, ask your care team. If it is safe to put it in the trash, empty the medication out of the container. Mix the medication with cat litter, dirt, coffee grounds, or other unwanted substance. Seal the mixture in a bag or container. Put it in the trash. ?NOTE: This sheet is a summary. It may not cover all possible information. If you have questions about this medicine, talk to your doctor, pharmacist, or health care provider. ?? 2023 Elsevier/Gold Standard (2020-11-16 00:00:00) ? ?

## 2022-10-12 ENCOUNTER — Other Ambulatory Visit: Payer: Self-pay | Admitting: Psychiatry

## 2022-10-12 DIAGNOSIS — F319 Bipolar disorder, unspecified: Secondary | ICD-10-CM

## 2022-10-13 NOTE — Telephone Encounter (Signed)
Advised patient of Dr.jaffe note below.  Per patient he declines the Podiatry referral he is going in a different direction.

## 2022-10-20 ENCOUNTER — Encounter: Payer: Self-pay | Admitting: Psychiatry

## 2022-10-20 ENCOUNTER — Ambulatory Visit (INDEPENDENT_AMBULATORY_CARE_PROVIDER_SITE_OTHER): Payer: PPO | Admitting: Psychiatry

## 2022-10-20 DIAGNOSIS — F319 Bipolar disorder, unspecified: Secondary | ICD-10-CM

## 2022-10-20 DIAGNOSIS — R7989 Other specified abnormal findings of blood chemistry: Secondary | ICD-10-CM | POA: Diagnosis not present

## 2022-10-20 DIAGNOSIS — Z630 Problems in relationship with spouse or partner: Secondary | ICD-10-CM

## 2022-10-20 DIAGNOSIS — Z635 Disruption of family by separation and divorce: Secondary | ICD-10-CM | POA: Diagnosis not present

## 2022-10-20 MED ORDER — DIVALPROEX SODIUM 250 MG PO DR TAB: 500.0000 mg | DELAYED_RELEASE_TABLET | Freq: Two times a day (BID) | ORAL | 3 refills | Status: DC

## 2022-10-20 NOTE — Progress Notes (Signed)
Timothy [name] [id number] [birth year] 72 y.o.  Subjective:   Patient ID:  Timothy [name] is a 72 y.o. (DOB 04-29-1951) male.  Chief Complaint:  Chief Complaint  Patient presents with   Follow-up   Stress    HPI Timothy [name] presents to the office today for follow-up of bipolar disorder and has been on Depakote DR 500 mg twice daily for many years.  seen 10/2018.  No meds changed.  Typically seen yearly in recent years because of long-term stability  01/23/20 appt with the following noted: Covid free. Health good.  Sleep 6 1/2 hours with CPAP.  Alert daytime. Overall doing alright mood.  Hip replacement gone bad after 1 and 1/2 year.  Appt Tuesday.  Function otherwise OK. Doesn't plan to retire so still working. No SE. Plan no med changes  08/24/2020 appointment with the following noted: Sep end of December from [place], married less than 2 years.  She's in counseling.  Is being told  This is stressful.  Have seen each other and vacationed together but not living together.  Would like to find out if he has narcissistic tendencies bc she has said this.  She's written hateful things about him.  Asks for therapist to help him figure out if he has personality disorder. Mood stable and good despite stressors.  Patient reports stable mood and denies depressed or irritable moods.  Patient denies any recent difficulty with anxiety.  Patient denies difficulty with sleep initiation or maintenance. Denies appetite disturbance.  Patient reports that energy and motivation have been good.  Patient denies any difficulty with concentration.  Patient denies any suicidal ideation.  Plan continue Depakote 250 mg tablets 2 twice daily  10/21/2021 appointment with the following noted: Worsening  leg weakness R>L with extensive neuro workup without explanation including local and [facility]. Including 2 nerve conduction studies and brain MRI. Sig stressor ongoing relationship and working through with [employer]  She  is upset with him daily. Mood is otherwise stable. No SE with Depakote. Still seeing therapist Mardelle Matte.  10/20/22 appt noted: Separated from 2nd wife.  Better without her.   On carbidopa-levodopa since 09/28/22 without effect. Sleep not great but enough hours.  EMA 3 AM and on better nights back to sleep but not more than 6 hours for at least a year.   Remembers mania clearly and this is not that.  Mania was euphoric and this is not that.  No racing  thoughts or other impulsivity.  No incr spending.  Frugal with retirement.  Lives within means.   Talked with brother in [place] about it.   Hard to walk with R leg.   Potential GF will leave [employer] after 13 years and will come and stay with him getting out soon.   Stopped drinking exactly in Jan.  Decided to stop   Past Psychiatric Medication Trials: Depakote 500 mg twice daily,  Ambien, ProSom, lorazepam, trazodone little effect  no hangover Effexor, paroxetine 20,  He has been on Depakote alone since 1998  Review of Systems:  Review of Systems  Musculoskeletal:  Positive for arthralgias and gait problem. Negative for back pain.  Neurological:  Positive for weakness. Negative for tremors.  Hip replacement 06/2018 CPAP since [facility] July 2019. Going well.  Medications: I have reviewed the patient's current medications.  Current Outpatient Medications  Medication Sig Dispense Refill   carbidopa-levodopa (SINEMET IR) 25-100 MG tablet Take 1/2 tablet twice daily for one week, then 1/2 tablet three times daily  for one week, then 1 tablet three times daily. 90 tablet 0   Ascorbic Acid (VITAMIN C PO) Take 1 tablet by mouth daily.     aspirin EC 81 MG tablet Take 81 mg by mouth daily. Swallow whole.     bisoprolol-hydrochlorothiazide (ZIAC) 5-6.25 MG tablet TAKE 1 TABLET BY MOUTH EVERY DAY FOR BLOOD PRESSURE 90 tablet 3   Cholecalciferol (VITAMIN D-3) 125 MCG (5000 UT) TABS Take 5,000 Units by mouth every evening.      Cyanocobalamin (VITAMIN B-12) 1000 MCG  SUBL Take 1 tablet SL Daily (Patient taking differently: Take 1,000 mcg by mouth daily.)  0   divalproex (DEPAKOTE) 250 MG DR tablet TAKE 2 TABLETS BY MOUTH 2 TIMES DAILY. 360 tablet 0   erythromycin ophthalmic ointment Apply 1 cm ribbon in right eye TID x 7 days. 1 g 0   gabapentin (NEURONTIN) 100 MG capsule Take 1 capsule (100 mg total) by mouth 3 (three) times daily. 90 capsule 0   meclizine (ANTIVERT) 25 MG tablet Take 1/2 to 1 tablet 2 to 3 x /day as needed for Dizziness / Vertigo 90 tablet 11   No current facility-administered medications for this visit.    Medication Side Effects: None  Allergies:  Allergies  Allergen Reactions   Ciprofloxacin Diarrhea    Past Medical History:  Diagnosis Date   Arthritis    Bipolar disorder (HCC)    controlled by meds since 1997   Cancer (HCC)    basal cell and pre melanoma on back   Neuromuscular disorder (HCC)    gait and ambulation problems, but no diagnosis from Neurology   Pneumonia    Sleep apnea    cpap   Wears glasses    reading    Family History  Adopted: Yes    Social History   Socioeconomic History   Marital status: Legally Separated    Spouse name: Not on file   Number of children: Not on file   Years of education: Not on file   Highest education level: Not on file  Occupational History   Not on file  Tobacco Use   Smoking status: Never   Smokeless tobacco: Never  Vaping Use   Vaping Use: Never used  Substance and Sexual Activity   Alcohol use: Yes    Comment: 5 days a week   Drug use: No   Sexual activity: Yes  Other Topics Concern   Not on file  Social History Narrative   Right handed   Social Determinants of Health   Financial Resource Strain: Not on file  Food Insecurity: No Food Insecurity (05/27/2022)   Hunger Vital Sign    Worried About Running Out of Food in the Last Year: Never true    Ran Out of Food in the Last Year: Never true  Transportation Needs: No Transportation Needs  (05/27/2022)   PRAPARE - Administrator, Civil Service (Medical): No    Lack of Transportation (Non-Medical): No  Physical Activity: Not on file  Stress: Not on file  Social Connections: Not on file  Intimate Partner Violence: Not At Risk (05/27/2022)   Humiliation, Afraid, Rape, and Kick questionnaire    Fear of Current or Ex-Partner: No    Emotionally Abused: No    Physically Abused: No    Sexually Abused: No    Past Medical History, Surgical history, Social history, and Family history were reviewed and updated as appropriate.   Please see review of systems for further  details on the patient's review from today.   Objective:   Physical Exam:  There were no vitals taken for this visit.  Physical Exam Constitutional:      General: He is not in acute distress.    Appearance: He is well-developed.  Musculoskeletal:        General: No deformity.  Neurological:     Mental Status: He is alert and oriented to person, place, and time.     Coordination: Coordination abnormal.     Gait: Gait abnormal.  Psychiatric:        Attention and Perception: Attention normal. He is attentive. He does not perceive auditory hallucinations.        Mood and Affect: Mood normal. Mood is not anxious or depressed. Affect is not labile, blunt, angry, tearful or inappropriate.        Speech: Speech normal. Speech is not rapid and pressured.        Behavior: Behavior normal.        Thought Content: Thought content normal. Thought content is not delusional. Thought content does not include homicidal or suicidal ideation. Thought content does not include suicidal plan.        Cognition and Memory: Cognition normal.        Judgment: Judgment normal.     Comments: Insight is good. No mania.     Lab Review:     Component Value Date/Time   NA 139 09/26/2022 1513   K 4.5 09/26/2022 1513   CL 102 09/26/2022 1513   CO2 30 09/26/2022 1513   GLUCOSE 93 09/26/2022 1513   BUN 30 (H) 09/26/2022  1513   CREATININE 1.01 09/26/2022 1513   CALCIUM 10.0 09/26/2022 1513   PROT 7.0 09/26/2022 1513   ALBUMIN 4.1 09/01/2016 1519   AST 20 09/26/2022 1513   ALT 19 09/26/2022 1513   ALKPHOS 45 09/01/2016 1519   BILITOT 0.3 09/26/2022 1513   GFRNONAA >60 05/28/2022 0357   GFRNONAA 80 07/20/2020 1539   GFRAA 93 07/20/2020 1539       Component Value Date/Time   WBC 9.0 09/26/2022 1513   RBC 4.86 09/26/2022 1513   HGB 14.1 09/26/2022 1513   HCT 42.0 09/26/2022 1513   PLT 232 09/26/2022 1513   MCV 86.4 09/26/2022 1513   MCH 29.0 09/26/2022 1513   MCHC 33.6 09/26/2022 1513   RDW 13.1 09/26/2022 1513   LYMPHSABS 2,835 09/26/2022 1513   MONOABS 0.7 02/26/2022 2127   EOSABS 90 09/26/2022 1513   BASOSABS 36 09/26/2022 1513    No results found for: "POCLITH", "LITHIUM"   Lab Results  Component Value Date   VALPROATE 53.2 06/27/2022     .res Assessment: Plan:    Bipolar I disorder (HCC) -- stable  Separated from spouse  Low vitamin B12 level  Relationship problem between partners  Mood stable with meds for years with Depakote 500 mg BID  20 min counseling.  Extensive discussion of his rather quick emotional attachment to a woman he only recently met.  Disc in detail looking for possible other manic sx but none elicited. Disc riks mania associated with strong emotional swings of attachment.  No change indicated.  Good response.  Labs per PCP yearly - good as noted above incl LFT's and CBC and VPA usually low normal and working.  Disc progressive leg weakness and impact on his quality of life.  FU 1 year.  Meredith Staggers, MD, DFAPA  Please see After Visit Summary for patient specific  instructions.  Future Appointments  Date Time Provider Department Center  11/01/2022  1:00 PM Butch Penny, NP [facility] None  03/28/2023  2:30 PM [name], MD [facility] None  06/12/2023 11:00 AM Raynelle Dick, NP [facility] None  09/26/2023  2:00 PM [name], MD  [facility] None    No orders of the defined types were placed in this encounter.     -------------------------------

## 2022-10-25 ENCOUNTER — Ambulatory Visit: Payer: PPO | Admitting: Pharmacist

## 2022-10-31 NOTE — Progress Notes (Addendum)
PATIENT: Timothy [name] DOB: 1950/08/07  REASON FOR VISIT: follow up HISTORY FROM: patient PRIMARY NEUROLOGIST: Dr. Vickey Huger  Chief Complaint  Patient presents with   Follow-up    Rm 19, cpap follow up. No concerns.  ESS 5.     HISTORY OF PRESENT ILLNESS:   Today 11/01/22:  Timothy [name] is a 72 y.o. male with a history of OSA on CPAP. Returns today for follow-up.  Reports that CPAP is working well for him.  He denies any new issues.  Continues to change out his supplies regularly.      HISTORY  11/16/21: Timothy [name] is a 72 year old male with a history of OSA on CPAP. He returns today for follow-up.  Reports that the CPAP is working well.  He returns today for follow-up.    REVIEW OF SYSTEMS: Out of a complete 14 system review of symptoms, the patient complains only of the following symptoms, and all other reviewed systems are negative.  ESS 5   ALLERGIES: Allergies  Allergen Reactions   Ciprofloxacin Diarrhea    HOME MEDICATIONS: Outpatient Medications Prior to Visit  Medication Sig Dispense Refill   Ascorbic Acid (VITAMIN C PO) Take 1 tablet by mouth daily.     aspirin EC 81 MG tablet Take 81 mg by mouth daily. Swallow whole.     bisoprolol-hydrochlorothiazide (ZIAC) 5-6.25 MG tablet TAKE 1 TABLET BY MOUTH EVERY DAY FOR BLOOD PRESSURE 90 tablet 3   carbidopa-levodopa (SINEMET IR) 25-100 MG tablet Take 1/2 tablet twice daily for one week, then 1/2 tablet three times daily for one week, then 1 tablet three times daily. 90 tablet 0   Cholecalciferol (VITAMIN D-3) 125 MCG (5000 UT) TABS Take 5,000 Units by mouth every evening.      Cyanocobalamin (VITAMIN B-12) 1000 MCG SUBL Take 1 tablet SL Daily (Patient taking differently: Take 1,000 mcg by mouth daily.)  0   divalproex (DEPAKOTE) 250 MG DR tablet Take 2 tablets (500 mg total) by mouth 2 (two) times daily. 360 tablet 3   erythromycin ophthalmic ointment Apply 1 cm ribbon in right eye TID x 7 days. 1 g 0    gabapentin (NEURONTIN) 100 MG capsule Take 1 capsule (100 mg total) by mouth 3 (three) times daily. 90 capsule 0   meclizine (ANTIVERT) 25 MG tablet Take 1/2 to 1 tablet 2 to 3 x /day as needed for Dizziness / Vertigo 90 tablet 11   No facility-administered medications prior to visit.    PAST MEDICAL HISTORY: Past Medical History:  Diagnosis Date   Arthritis    Bipolar disorder (HCC)    controlled by meds since 1997   Cancer (HCC)    basal cell and pre melanoma on back   Neuromuscular disorder (HCC)    gait and ambulation problems, but no diagnosis from Neurology   Pneumonia    Sleep apnea    cpap   Wears glasses    reading    PAST SURGICAL HISTORY: Past Surgical History:  Procedure Laterality Date   APPENDECTOMY     COLONOSCOPY     KNEE ARTHROSCOPY W/ MENISCAL REPAIR  2004   left   ORIF HUMERUS FRACTURE Left 09/19/2013   Procedure: LEFT OPEN REDUCTION INTERNAL FIXATION (ORIF) PROXIMAL HUMERUS; ROTATOR CUFF REPAIR;  Surgeon: Loreta Ave, MD;  Location: [facility];  Service: Orthopedics;  Laterality: Left;   REVERSE SHOULDER ARTHROPLASTY Right 05/27/2022   Procedure: REVERSE SHOULDER ARTHROPLASTY;  Surgeon: Duwayne Heck  Luisa Hart, MD;  Location: [facility];  Service: Orthopedics;  Laterality: Right;  120   TONSILLECTOMY     TOTAL HIP ARTHROPLASTY Left 07/18/2018   Procedure: TOTAL HIP ARTHROPLASTY ANTERIOR APPROACH;  Surgeon: Samson Frederic, MD;  Location: [facility];  Service: Orthopedics;  Laterality: Left;    FAMILY HISTORY: Family History  Adopted: Yes    SOCIAL HISTORY: Social History   Socioeconomic History   Marital status: Legally Separated    Spouse name: Not on file   Number of children: Not on file   Years of education: Not on file   Highest education level: Not on file  Occupational History   Not on file  Tobacco Use   Smoking status: Never   Smokeless tobacco: Never  Vaping Use   Vaping Use: Never used  Substance and Sexual Activity    Alcohol use: Yes    Comment: 5 days a week   Drug use: No   Sexual activity: Yes  Other Topics Concern   Not on file  Social History Narrative   Right handed   Social Determinants of Health   Financial Resource Strain: Not on file  Food Insecurity: No Food Insecurity (05/27/2022)   Hunger Vital Sign    Worried About Running Out of Food in the Last Year: Never true    Ran Out of Food in the Last Year: Never true  Transportation Needs: No Transportation Needs (05/27/2022)   PRAPARE - Administrator, Civil Service (Medical): No    Lack of Transportation (Non-Medical): No  Physical Activity: Not on file  Stress: Not on file  Social Connections: Not on file  Intimate Partner Violence: Not At Risk (05/27/2022)   Humiliation, Afraid, Rape, and Kick questionnaire    Fear of Current or Ex-Partner: No    Emotionally Abused: No    Physically Abused: No    Sexually Abused: No      PHYSICAL EXAM  Vitals:   11/01/22 1256  BP: 112/65  Pulse: 67  Weight: 173 lb (78.5 kg)  Height: 5\' 9"  (1.753 m)   Body mass index is 25.55 kg/m.  Generalized: Well developed, in no acute distress  Chest: Lungs clear to auscultation bilaterally  Neurological examination  Mentation: Alert oriented to time, place, history taking. Follows all commands speech and language fluent Cranial nerve II-XII: Extraocular movements were full, visual field were full on confrontational test Head turning and shoulder shrug  were normal and symmetric. Gait and station: Patient's gait is slightly unstable.  Shuffling gait   DIAGNOSTIC DATA (LABS, IMAGING, TESTING) - I reviewed patient records, labs, notes, testing and imaging myself where available.  Lab Results  Component Value Date   WBC 9.0 09/26/2022   HGB 14.1 09/26/2022   HCT 42.0 09/26/2022   MCV 86.4 09/26/2022   PLT 232 09/26/2022      Component Value Date/Time   NA 139 09/26/2022 1513   K 4.5 09/26/2022 1513   CL 102 09/26/2022  1513   CO2 30 09/26/2022 1513   GLUCOSE 93 09/26/2022 1513   BUN 30 (H) 09/26/2022 1513   CREATININE 1.01 09/26/2022 1513   CALCIUM 10.0 09/26/2022 1513   PROT 7.0 09/26/2022 1513   ALBUMIN 4.1 09/01/2016 1519   AST 20 09/26/2022 1513   ALT 19 09/26/2022 1513   ALKPHOS 45 09/01/2016 1519   BILITOT 0.3 09/26/2022 1513   GFRNONAA >60 05/28/2022 0357   GFRNONAA 80 07/20/2020 1539   GFRAA 93 07/20/2020 1539  Lab Results  Component Value Date   CHOL 166 09/26/2022   HDL 59 09/26/2022   LDLCALC 89 09/26/2022   TRIG 89 09/26/2022   CHOLHDL 2.8 09/26/2022   Lab Results  Component Value Date   HGBA1C 5.5 09/26/2022   Lab Results  Component Value Date   VITAMINB12 425 03/21/2022   Lab Results  Component Value Date   TSH 1.02 09/26/2022      ASSESSMENT AND PLAN 72 y.o. year old male  has a past medical history of Arthritis, Bipolar disorder (HCC), Cancer (HCC), Neuromuscular disorder (HCC), Pneumonia, Sleep apnea, and Wears glasses. here with:  OSA on CPAP  - CPAP compliance excellent - Good treatment of AHI  - Encourage patient to use CPAP nightly and > 4 hours each night - F/U in 1 year or sooner if needed   Butch Penny, MSN, NP-C 11/01/2022, 1:05 PM [facility] [address], [address] [place], [place] [address] [phone]

## 2022-11-01 ENCOUNTER — Encounter: Payer: Self-pay | Admitting: Adult Health

## 2022-11-01 ENCOUNTER — Ambulatory Visit (INDEPENDENT_AMBULATORY_CARE_PROVIDER_SITE_OTHER): Payer: PPO | Admitting: Adult Health

## 2022-11-01 VITALS — BP 112/65 | HR 67 | Ht 69.0 in | Wt 173.0 lb

## 2022-11-01 DIAGNOSIS — G4733 Obstructive sleep apnea (adult) (pediatric): Secondary | ICD-10-CM

## 2022-11-01 NOTE — Patient Instructions (Signed)
Continue using CPAP nightly and greater than 4 hours each night °If your symptoms worsen or you develop new symptoms please let us know.  ° °

## 2022-11-10 ENCOUNTER — Encounter: Payer: PPO | Admitting: Internal Medicine

## 2022-11-17 ENCOUNTER — Ambulatory Visit: Payer: PPO | Admitting: Adult Health

## 2022-11-18 DIAGNOSIS — R269 Unspecified abnormalities of gait and mobility: Secondary | ICD-10-CM | POA: Diagnosis not present

## 2022-11-28 ENCOUNTER — Other Ambulatory Visit: Payer: Self-pay | Admitting: Neurology

## 2022-12-01 ENCOUNTER — Emergency Department (HOSPITAL_COMMUNITY)
Admission: EM | Admit: 2022-12-01 | Discharge: 2022-12-01 | Disposition: A | Discharge status: Home / Self Care | Payer: PPO | Attending: Emergency Medicine | Admitting: Emergency Medicine

## 2022-12-01 ENCOUNTER — Other Ambulatory Visit: Payer: Self-pay

## 2022-12-01 ENCOUNTER — Encounter (HOSPITAL_COMMUNITY): Payer: Self-pay

## 2022-12-01 DIAGNOSIS — Y92480 Sidewalk as the place of occurrence of the external cause: Secondary | ICD-10-CM | POA: Diagnosis not present

## 2022-12-01 DIAGNOSIS — W01198A Fall on same level from slipping, tripping and stumbling with subsequent striking against other object, initial encounter: Secondary | ICD-10-CM | POA: Insufficient documentation

## 2022-12-01 DIAGNOSIS — Z7982 Long term (current) use of aspirin: Secondary | ICD-10-CM | POA: Diagnosis not present

## 2022-12-01 DIAGNOSIS — S0181XA Laceration without foreign body of other part of head, initial encounter: Secondary | ICD-10-CM | POA: Insufficient documentation

## 2022-12-01 DIAGNOSIS — S0993XA Unspecified injury of face, initial encounter: Secondary | ICD-10-CM | POA: Diagnosis present

## 2022-12-01 DIAGNOSIS — Y9301 Activity, walking, marching and hiking: Secondary | ICD-10-CM | POA: Diagnosis not present

## 2022-12-01 NOTE — Discharge Instructions (Addendum)
You have been seen today for your complaint of fall, forehead laceration. Home care instructions are as follows:  Keep the area clean and dry for the next 5 days. Do not put any water on the laceration for at least 5 days. Follow up with:your PCP in one week Please seek immediate medical care if you develop any of the following symptoms: Your pain suddenly increases and is severe. You develop severe swelling around the wound. The wound is on your hand or foot, and you cannot properly move a finger or toe. The wound is on your hand or foot, and you notice that your fingers or toes look pale or bluish. You have a red streak going away from your wound. You develop painful lumps near the wound or on skin anywhere else on your body. At this time there does not appear to be the presence of an emergent medical condition, however there is always the potential for conditions to change. Please read and follow the below instructions.  Do not take your medicine if  develop an itchy rash, swelling in your mouth or lips, or difficulty breathing; call 911 and seek immediate emergency medical attention if this occurs.  You may review your lab tests and imaging results in their entirety on your MyChart account.  Please discuss all results of fully with your primary care provider and other specialist at your follow-up visit.  Note: Portions of this text may have been transcribed using voice recognition software. Every effort was made to ensure accuracy; however, inadvertent computerized transcription errors may still be present.

## 2022-12-01 NOTE — ED Triage Notes (Signed)
Patient got tripped up and fell onto concrete. 1cm laceration to right eyebrow. Takes baby aspirin. Tetanus updated.

## 2022-12-01 NOTE — ED Provider Notes (Signed)
[place] EMERGENCY DEPARTMENT AT [facility] Provider Note   CSN: [id number] Arrival date & time: 12/01/22  1845     History  Chief Complaint  Patient presents with   Head Laceration    Timothy [name] is a 72 y.o. male.  With a history of arthritis, bipolar disorder who presents to the ED for evaluation of a fall.  He states he was walking on asphalt road when he lost his balance and fell forward.  He struck his head on the pavement.  He denies any loss of consciousness.  Takes a baby aspirin every day but no blood thinners.  This occurred at approximately 4 PM tonight.  He states he does not walk very well at baseline he typically uses a walker but was not using 1 at this time.  States his tetanus vaccine is up-to-date.  He denies any headaches, vision changes, numbness, weakness, tingling, neck pain.   Head Laceration       Home Medications Prior to Admission medications   Medication Sig Start Date End Date Taking? Authorizing Provider  Ascorbic Acid (VITAMIN C PO) Take 1 tablet by mouth daily.    [provider]  aspirin EC 81 MG tablet Take 81 mg by mouth daily. Swallow whole.    [provider]  bisoprolol-hydrochlorothiazide (ZIAC) 5-6.25 MG tablet TAKE 1 TABLET BY MOUTH EVERY DAY FOR BLOOD PRESSURE 03/02/22   Raynelle Dick, NP  carbidopa-levodopa (SINEMET IR) 25-100 MG tablet 1 tablet three times daily. 11/30/22   Drema Dallas, DO  Cholecalciferol (VITAMIN D-3) 125 MCG (5000 UT) TABS Take 5,000 Units by mouth every evening.     [provider]  Cyanocobalamin (VITAMIN B-12) 1000 MCG SUBL Take 1 tablet SL Daily Patient taking differently: Take 1,000 mcg by mouth daily. 03/21/22   [name], MD  divalproex (DEPAKOTE) 250 MG DR tablet Take 2 tablets (500 mg total) by mouth 2 (two) times daily. 10/20/22   Cottle, Steva Ready., MD  erythromycin ophthalmic ointment Apply 1 cm ribbon in right eye TID x 7 days. 10/11/22   Adela Glimpse,  NP  gabapentin (NEURONTIN) 100 MG capsule Take 1 capsule (100 mg total) by mouth 3 (three) times daily. 10/11/22 11/10/22  Adela Glimpse, NP  meclizine (ANTIVERT) 25 MG tablet Take 1/2 to 1 tablet 2 to 3 x /day as needed for Dizziness / Vertigo 09/26/22   [name], MD      Allergies    Ciprofloxacin    Review of Systems   Review of Systems  Skin:  Positive for wound.  All other systems reviewed and are negative.   Physical Exam Updated Vital Signs BP 116/81   Pulse 72   Temp 98.4 F (36.9 C) (Oral)   Resp 18   Ht 5\' 9"  (1.753 m)   Wt 79.4 kg   SpO2 99%   BMI 25.84 kg/m  Physical Exam Vitals and nursing note reviewed.  Constitutional:      General: He is not in acute distress.    Appearance: Normal appearance. He is normal weight. He is not ill-appearing.  HENT:     Head: Normocephalic.     Comments: 1 cm well-approximated vertical laceration to the right forehead, beginning just superiorly to the eyebrow. No active bleeding. Pulmonary:     Effort: Pulmonary effort is normal. No respiratory distress.  Abdominal:     General: Abdomen is flat.  Musculoskeletal:        General: Normal  range of motion.     Cervical back: Neck supple.  Skin:    General: Skin is warm and dry.  Neurological:     Mental Status: He is alert and oriented to person, place, and time.  Psychiatric:        Mood and Affect: Mood normal.        Behavior: Behavior normal.     ED Results / Procedures / Treatments   Labs (all labs ordered are listed, but only abnormal results are displayed) Labs Reviewed - No data to display  EKG None  Radiology No results found.  Procedures .Marland KitchenLaceration Repair  Date/Time: 12/01/2022 7:34 PM  Performed by: Michelle Piper, PA-C Authorized by: Michelle Piper, PA-C   Consent:    Consent obtained:  Verbal   Consent given by:  Patient   Risks discussed:  Poor cosmetic result and poor wound healing   Alternatives discussed:  No  treatment Universal protocol:    Patient identity confirmed:  Verbally with patient and arm band Anesthesia:    Anesthesia method:  None Laceration details:    Location:  Face   Face location:  Forehead   Length (cm):  1 Exploration:    Hemostasis achieved with:  Direct pressure Treatment:    Area cleansed with:  Shur-Clens Skin repair:    Repair method:  Tissue adhesive Approximation:    Approximation:  Close Repair type:    Repair type:  Simple Post-procedure details:    Dressing:  Open (no dressing)   Procedure completion:  Tolerated well, no immediate complications     Medications Ordered in ED Medications - No data to display  ED Course/ Medical Decision Making/ A&P                             Medical Decision Making This patient presents to the ED for concern of Fall, forehead laceration, this involves an extensive number of treatment options, and is a complaint that carries with it a high risk of complications and morbidity.  The differential diagnosis includes fracture, laceration, abrasion, intracranial abnormalities including bleeds.  Co morbidities that complicate the patient evaluation  arthritis, bipolar disorder  My initial workup includes laceration cleaning and repair  Additional history obtained from: Nursing notes from this visit.   I ordered imaging studies including patient adamantly declines CT imaging of the head  Afebrile, hemodynamically stable.  72 year old male presenting to the ED for evaluation of a forehead laceration after a fall.  He denies any pain at this time.  No neurologic complaints.  States he would just like the laceration repair today.  He adamantly declined CT imaging.  I had a shared decision-making conversation with the patient regarding suturing versus Dermabond of the wound.  I discussed the pros and cons of each. Patient would like Dermabond today.  Wound was cleansed and closed as stated below.  His tetanus vaccine is  up-to-date.  He was encouraged to follow-up with his primary care provider in 1 week for reevaluation.  He was given return precautions.  Stable at discharge.  At this time there does not appear to be any evidence of an acute emergency medical condition and the patient appears stable for discharge with appropriate outpatient follow up. Diagnosis was discussed with patient who verbalizes understanding of care plan and is agreeable to discharge. I have discussed return precautions with patient who verbalizes understanding. Patient encouraged to follow-up with their PCP within  1 week. All questions answered.  Note: Portions of this report may have been transcribed using voice recognition software. Every effort was made to ensure accuracy; however, inadvertent computerized transcription errors may still be present.        Final Clinical Impression(s) / ED Diagnoses Final diagnoses:  Laceration of forehead, initial encounter    Rx / DC Orders ED Discharge Orders     None         Mora Bellman 12/01/22 1952    Terald Sleeper, MD 12/01/22 2152

## 2022-12-08 ENCOUNTER — Telehealth: Payer: Self-pay

## 2022-12-08 NOTE — Telephone Encounter (Signed)
Transition Care Management Unsuccessful Follow-up Telephone Call  Date of discharge and from where:  [facility] 7/4  Attempts:  1st Attempt  Reason for unsuccessful TCM follow-up call:  No answer/busy   Dwana Garin [facility], [place] [phone] [address], [place], [place] [address] Phone: [phone] Email: [email or web address]       

## 2022-12-08 NOTE — Telephone Encounter (Signed)
Transition Care Management Unsuccessful Follow-up Telephone Call  Date of discharge and from where:  [facility] 7/4  Attempts:  2nd Attempt  Reason for unsuccessful TCM follow-up call:  Left voice message   Lenard Forth [facility] Guide, [facility] [phone] [address] [place], [place], [place] [address] Phone: [phone] Email: [email or web address]

## 2022-12-13 ENCOUNTER — Telehealth: Payer: Self-pay | Admitting: Internal Medicine

## 2022-12-13 ENCOUNTER — Ambulatory Visit: Payer: PPO | Admitting: Nurse Practitioner

## 2022-12-13 NOTE — Telephone Encounter (Signed)
PT REFUSED TO SCHEDULE A ER F/U.

## 2022-12-24 ENCOUNTER — Other Ambulatory Visit: Payer: Self-pay | Admitting: Neurology

## 2022-12-26 DIAGNOSIS — D2271 Melanocytic nevi of right lower limb, including hip: Secondary | ICD-10-CM | POA: Diagnosis not present

## 2022-12-26 DIAGNOSIS — L578 Other skin changes due to chronic exposure to nonionizing radiation: Secondary | ICD-10-CM | POA: Diagnosis not present

## 2022-12-26 DIAGNOSIS — Z85828 Personal history of other malignant neoplasm of skin: Secondary | ICD-10-CM | POA: Diagnosis not present

## 2022-12-26 DIAGNOSIS — Z85820 Personal history of malignant melanoma of skin: Secondary | ICD-10-CM | POA: Diagnosis not present

## 2022-12-26 DIAGNOSIS — D2272 Melanocytic nevi of left lower limb, including hip: Secondary | ICD-10-CM | POA: Diagnosis not present

## 2022-12-26 DIAGNOSIS — L821 Other seborrheic keratosis: Secondary | ICD-10-CM | POA: Diagnosis not present

## 2022-12-26 DIAGNOSIS — D225 Melanocytic nevi of trunk: Secondary | ICD-10-CM | POA: Diagnosis not present

## 2022-12-26 DIAGNOSIS — L570 Actinic keratosis: Secondary | ICD-10-CM | POA: Diagnosis not present

## 2022-12-27 ENCOUNTER — Telehealth: Payer: Self-pay | Admitting: Neurology

## 2022-12-27 DIAGNOSIS — R269 Unspecified abnormalities of gait and mobility: Secondary | ICD-10-CM | POA: Diagnosis not present

## 2022-12-27 DIAGNOSIS — G20C Parkinsonism, unspecified: Secondary | ICD-10-CM | POA: Diagnosis not present

## 2022-12-27 NOTE — Telephone Encounter (Signed)
Patient has one refill left per script sent 11/30/22.

## 2022-12-27 NOTE — Telephone Encounter (Signed)
Left message with the after hour service on 12-27-22  12:30 pm    Caller states that he needs a RX for the carbidopa levodopa

## 2022-12-28 NOTE — Progress Notes (Unsigned)
MEDICARE ANNUAL WELLNESS VISIT AND FOLLOW UP Assessment:   Diagnoses and all orders for this visit: Encounter for Medicare annual wellness exam Due annually  Health maintenance reviewed  Essential hypertension Continue medication: Ziac 5/6.25 mg every day  Monitor blood pressure at home; call if consistently over 130/80 Continue DASH diet.   Reminder to go to the ER if any CP, SOB, nausea, dizziness, severe HA, changes vision/speech, left arm numbness and tingling and jaw pain.  Vitamin D deficiency Continue supplementation; goal is 60-100;  Defer vitamin D level  OSA on CPAP Reports 100% compliance; no concerns; follows with Dr. Vickey Huger     Other abnormal glucose Recent A1Cs at goal Discussed diet/exercise, weight management  Defer A1C;   Medication management CBC, CMP/GFR , Magnesium  Bipolar type 1 depression (HCC) Continue follow up with Dr. Jennelle Human Check valproic acid levels   History of adenomatous polyp of colon Colonoscopy UTD 04/08/2021- negative  Degenerative scoliosis/5R L5 radiculopathy Has had extensive workup per below, patient noting new R>L weakness that does correspond with CT myelogram and EMG/NCV results though note not the same concern he was having when he saw neurosurgery Has completed 2 courses of PT with no change in symptoms Was started on Sinemet but has not noticed any difference in symptoms Monitor and if worsens notify the office  Overweight BMI 26, adult Continue to recommend diet heavy in fruits and veggies and low in animal meats, cheeses, and dairy products, appropriate calorie intake Discuss exercise recommendations routinely Continue to monitor weight at each visit    Over 30 minutes of exam, counseling, chart review, and critical decision making was performed  Future Appointments  Date Time Provider Department Center  03/28/2023  2:30 PM [name], MD [facility] None  09/26/2023  2:00 PM [name], MD [facility]  None  10/24/2023  1:00 PM Cottle, Steva Ready., MD [facility] None  11/02/2023  1:00 PM Butch Penny, NP [facility] None  12/29/2023 10:30 AM Raynelle Dick, NP [facility] None     Plan:   During the course of the visit the patient was educated and counseled about appropriate screening and preventive services including:   Pneumococcal vaccine  Influenza vaccine Prevnar 13 Td vaccine Screening electrocardiogram Colorectal cancer screening Diabetes screening Glaucoma screening Nutrition counseling    Subjective:  Timothy [name] is a 72 y.o. male who presents for Medicare Annual Wellness Visit and 6 month follow up for HTN, hyperlipidemia, glucose management, and vitamin D Def.    He had L hip replaced by Dr. Linna Caprice in 2020, reports was recovering steadily but reports winter he noted progressive weakness of hip and thigh on left, was evaluated with normal hip reported. He was evaluated by Dr. Turner Daniels who felt likely lumbar etiology. He was referred to neurosurgery, ? Dr. Tressie Stalker who advised degenerative disc disease but without indication for surgery at that time and was recommended PT. Patient reported no improvement with this. He continued to perceive weakness of bilateral legs, note L>R at that time. He underwent L5-S1 interlaminar injection in 04/2020 without relief. He was evaluated by neurology Dr. 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 The patient notes in the last 4-6 weeks has noted less L sided weakness, has had R "foot drop" - difficulty lifting foot, shuffling and stumbling, notes lumbar pain is rare and  intermittent. He reports called back this AM for recommendations and was advised further PT, very frustrated as has completed 2 courses without benefit thus far. Has continued with home  exercises for back. He continues to have weakness R>L. No foot drop.  Not using cane. Recently added Carbidopa- Levidopa 25/100 mg TID.   Patient  followed by Dr Jennelle Human for Bipolar Depression & is on Depakote. Levels were checked in Feb 2022. Patient feels he is doing well.   He has OSA on CPAP since 11/2017; no issues, endorses 100% compliance and endorses restorative sleep. Followed by Dr. Marylou Flesher annually.   BMI is Body mass index is 26.11 kg/m., he has been working on diet and exercise, does free weights and back exercises at home, no longer able to take walks or hike due to extremity weakness working up by neuro.  Wt Readings from Last 3 Encounters:  12/29/22 176 lb 12.8 oz (80.2 kg)  12/01/22 175 lb (79.4 kg)  11/01/22 173 lb (78.5 kg)   He does not check BP at home, today their BP is BP: 126/62  BP Readings from Last 3 Encounters:  12/29/22 126/62  12/01/22 116/81  11/01/22 112/65  He does workout. He denies chest pain, shortness of breath, dizziness.    He is not on cholesterol medication. His cholesterol is at goal and has been for several years. The cholesterol last visit was:   Lab Results  Component Value Date   CHOL 166 09/26/2022   HDL 59 09/26/2022   LDLCALC 89 09/26/2022   TRIG 89 09/26/2022   CHOLHDL 2.8 09/26/2022   He has been working on diet and exercise for glucose management, and denies foot ulcerations, increased appetite, nausea, paresthesia of the feet, polydipsia, polyuria, visual disturbances, vomiting and weight loss. Last A1C in the office was:  Lab Results  Component Value Date   HGBA1C 5.5 09/26/2022   Last GFR Lab Results  Component Value Date   EGFR 79 09/26/2022     Patient is on Vitamin D supplement, no recent dose change  Lab Results  Component Value Date   VD25OH 92 09/26/2022      Medication Review:   Current Outpatient Medications (Cardiovascular):    bisoprolol-hydrochlorothiazide (ZIAC) 5-6.25 MG tablet, TAKE 1 TABLET BY MOUTH  EVERY DAY FOR BLOOD PRESSURE   Current Outpatient Medications (Analgesics):    aspirin EC 81 MG tablet, Take 81 mg by mouth daily. Swallow whole.  Current Outpatient Medications (Hematological):    Cyanocobalamin (VITAMIN B-12) 1000 MCG SUBL, Take 1 tablet SL Daily (Patient taking differently: Take 1,000 mcg by mouth daily.)  Current Outpatient Medications (Other):    carbidopa-levodopa (SINEMET IR) 25-100 MG tablet, 1 tablet three times daily.   Cholecalciferol (VITAMIN D-3) 125 MCG (5000 UT) TABS, Take 5,000 Units by mouth every evening.    divalproex (DEPAKOTE) 250 MG DR tablet, Take 2 tablets (500 mg total) by mouth 2 (two) times daily.   meclizine (ANTIVERT) 25 MG tablet, Take 1/2 to 1 tablet 2 to 3 x /day as needed for Dizziness / Vertigo   Ascorbic Acid (VITAMIN C PO), Take 1 tablet by mouth daily.   erythromycin ophthalmic ointment, Apply 1 cm ribbon in right eye TID x 7 days. (Patient not taking: Reported on 12/29/2022)   gabapentin (NEURONTIN) 100 MG capsule, Take 1 capsule (100 mg total) by mouth 3 (three) times daily.  Allergies: Allergies  Allergen Reactions   Ciprofloxacin Diarrhea    Other Reaction(s): Unknown    Current  Problems (verified) has HTN (hypertension); Vitamin D deficiency; Abnormal glucose; Depression; History of adenomatous polyp of colon; S/P total hip arthroplasty; OSA on CPAP; Bipolar I disorder (HCC); Degenerative scoliosis in adult patient; and S/P reverse total shoulder arthroplasty, right on their problem list.  Screening Tests Immunization History  Administered Date(s) Administered   Moderna Sars-Covid-2 Vaccination 07/02/2019, 07/30/2019   PFIZER(Purple Top)SARS-COV-2 Vaccination 03/13/2020   Pneumococcal Conjugate-13 04/05/2017   Pneumococcal Polysaccharide-23 11/22/2018   Td 11/06/2017   Health Maintenance  Topic Date Due   COVID-19 Vaccine (4 - 2023-24 season) 01/14/2023 (Originally 01/28/2022)   Zoster Vaccines- Shingrix (1 of 2)  03/31/2023 (Originally 07/27/1969)   Medicare Annual Wellness (AWV)  12/29/2023   Colonoscopy  04/08/2026   DTaP/Tdap/Td (2 - Tdap) 11/07/2027   Pneumonia Vaccine 58+ Years old  Completed   Hepatitis C Screening  Completed   HPV VACCINES  Aged Out   INFLUENZA VACCINE  Discontinued     Names of Other Physician/Practitioners you currently use: 1. [facility] here for primary care 2. Dr. [place], eye doctor, last visit 2022, no issues, readers  3. [facility], dentist, last visit 2022 4. Dermatology specialists, annually, last in 09/2019, has scheduled 11/2021  Patient Care Team: [name], MD as PCP - General (Internal Medicine) Cottle, Steva Ready., MD as Attending Physician (Psychiatry) Drema Dallas, DO as Consulting Physician (Neurology) Carmelina Dane, [facility] (Inactive) as Pharmacist (Pharmacist)  Surgical: He  has a past surgical history that includes Tonsillectomy; Appendectomy; Colonoscopy; Knee arthroscopy w/ meniscal repair (2004); ORIF humerus fracture (Left, 09/19/2013); Total hip arthroplasty (Left, 07/18/2018); and Reverse shoulder arthroplasty (Right, 05/27/2022). Family His family history is not on file. He was adopted. Social history  He reports that he has never smoked. He has never used smokeless tobacco. He reports current alcohol use. He reports that he does not use drugs.  MEDICARE WELLNESS OBJECTIVES: Physical activity:  Some walking Cardiac risk factors: Cardiac Risk Factors include: hypertension;male gender;advanced age (>74men, >75 women) Depression/mood screen:      12/29/2022   11:49 AM  Depression screen PHQ 2/9  Decreased Interest 0  Down, Depressed, Hopeless 0  PHQ - 2 Score 0    ADLs:     12/29/2022   11:48 AM 05/27/2022    5:06 PM  In your present state of health, do you have any difficulty performing the following activities:  Hearing? 0   Vision? 0   Difficulty concentrating or making decisions? 0    Walking or climbing stairs? 0   Dressing or bathing? 0   Doing errands, shopping? 0 0     Cognitive Testing  Alert? Yes  Normal Appearance?Yes  Oriented to person? Yes  Place? Yes   Time? Yes  Recall of three objects?  Yes  Can perform simple calculations? Yes  Displays appropriate judgment?Yes  Can read the correct time from a watch face?Yes  EOL planning: Does Patient Have a Medical Advance Directive?: Yes Type of Advance Directive: Living will, Healthcare Power of Attorney Does patient want to make changes to medical advance directive?: No - Patient declined Copy of Healthcare Power of Attorney in Chart?: No - copy requested   Objective:   Today's Vitals   12/29/22 1130  BP: 126/62  Pulse: 68  Temp: 97.9 F (36.6 C)  SpO2: 95%  Weight: 176 lb 12.8 oz (80.2 kg)  Height: 5\' 9"  (1.753 m)      Body mass index is 26.11 kg/m.  General  appearance: alert, no distress, WD/WN, male HEENT: normocephalic, sclerae anicteric, TMs pearly, nares patent, no discharge or erythema, pharynx normal Oral cavity: MMM, no lesions Neck: supple, no lymphadenopathy, no thyromegaly, no masses Heart: RRR, normal S1, S2, no murmurs Lungs: CTA bilaterally, no wheezes, rhonchi, or rales Abdomen: +bs, soft, non tender, non distended, no masses, no hepatomegaly, no splenomegaly Musculoskeletal: nontender, no swelling, no obvious deformity.  Extremities: 1+ pitting edema of lower legs bilaterally, no cyanosis, no clubbing Pulses: 2+ symmetric, upper and lower extremities, normal cap refill Neurological: alert, oriented x 3, CN2-12 intact, strength normal upper extremities, lower extremities R 4/5 with knee extension and flexion, Left 5/5, ankle flexion and extension is 5/5 and symmetrical. Sensation normal throughout, DTRs 2+ throughout, no cerebellar signs, gait mildly wide based and shuffling. Psychiatric: normal affect, behavior normal, pleasant   Medicare Attestation I have personally  reviewed: The patient's medical and social history Their use of alcohol, tobacco or illicit drugs Their current medications and supplements The patient's functional ability including ADLs,fall risks, home safety risks, cognitive, and hearing and visual impairment Diet and physical activities Evidence for depression or mood disorders  The patient's weight, height, BMI, and visual acuity have been recorded in the chart.  I have made referrals, counseling, and provided education to the patient based on review of the above and I have provided the patient with a written personalized care plan for preventive services.     Raynelle Dick, NP   12/29/2022

## 2022-12-29 ENCOUNTER — Encounter: Payer: Self-pay | Admitting: Nurse Practitioner

## 2022-12-29 ENCOUNTER — Ambulatory Visit (INDEPENDENT_AMBULATORY_CARE_PROVIDER_SITE_OTHER): Payer: PPO | Admitting: Nurse Practitioner

## 2022-12-29 VITALS — BP 126/62 | HR 68 | Temp 97.9°F | Ht 69.0 in | Wt 176.8 lb

## 2022-12-29 DIAGNOSIS — G4733 Obstructive sleep apnea (adult) (pediatric): Secondary | ICD-10-CM

## 2022-12-29 DIAGNOSIS — M5416 Radiculopathy, lumbar region: Secondary | ICD-10-CM

## 2022-12-29 DIAGNOSIS — R6889 Other general symptoms and signs: Secondary | ICD-10-CM

## 2022-12-29 DIAGNOSIS — I10 Essential (primary) hypertension: Secondary | ICD-10-CM | POA: Diagnosis not present

## 2022-12-29 DIAGNOSIS — E559 Vitamin D deficiency, unspecified: Secondary | ICD-10-CM | POA: Diagnosis not present

## 2022-12-29 DIAGNOSIS — Z0001 Encounter for general adult medical examination with abnormal findings: Secondary | ICD-10-CM | POA: Diagnosis not present

## 2022-12-29 DIAGNOSIS — F319 Bipolar disorder, unspecified: Secondary | ICD-10-CM

## 2022-12-29 DIAGNOSIS — M4150 Other secondary scoliosis, site unspecified: Secondary | ICD-10-CM

## 2022-12-29 DIAGNOSIS — R7309 Other abnormal glucose: Secondary | ICD-10-CM | POA: Diagnosis not present

## 2022-12-29 DIAGNOSIS — E663 Overweight: Secondary | ICD-10-CM

## 2022-12-29 DIAGNOSIS — E782 Mixed hyperlipidemia: Secondary | ICD-10-CM

## 2022-12-29 DIAGNOSIS — Z79899 Other long term (current) drug therapy: Secondary | ICD-10-CM

## 2022-12-29 DIAGNOSIS — Z0000 Encounter for general adult medical examination without abnormal findings: Secondary | ICD-10-CM

## 2022-12-29 DIAGNOSIS — Z6827 Body mass index (BMI) 27.0-27.9, adult: Secondary | ICD-10-CM

## 2022-12-29 DIAGNOSIS — Z6826 Body mass index (BMI) 26.0-26.9, adult: Secondary | ICD-10-CM

## 2022-12-29 DIAGNOSIS — Z86010 Personal history of colonic polyps: Secondary | ICD-10-CM

## 2022-12-29 NOTE — Patient Instructions (Signed)

## 2023-01-05 ENCOUNTER — Other Ambulatory Visit: Payer: Self-pay | Admitting: Nurse Practitioner

## 2023-01-05 DIAGNOSIS — I10 Essential (primary) hypertension: Secondary | ICD-10-CM

## 2023-01-28 ENCOUNTER — Other Ambulatory Visit: Payer: Self-pay | Admitting: Neurology

## 2023-02-02 ENCOUNTER — Ambulatory Visit (INDEPENDENT_AMBULATORY_CARE_PROVIDER_SITE_OTHER): Payer: PPO | Admitting: Physician Assistant

## 2023-02-02 ENCOUNTER — Encounter: Payer: Self-pay | Admitting: Physician Assistant

## 2023-02-02 VITALS — BP 138/74 | HR 68 | Ht 69.0 in | Wt 172.0 lb

## 2023-02-02 DIAGNOSIS — R251 Tremor, unspecified: Secondary | ICD-10-CM | POA: Diagnosis not present

## 2023-02-02 NOTE — Progress Notes (Signed)
Assessment/Plan:   1.  Parkinson's syndrome  Results of DaTscan are equivocal.  He has been seen at [facility], and has reported interest of continuing his care there, as it is a research facility.  Due to schedule conflicts, unable to see Dr. Everlena Cooper, there will be no charge for this visit. Continue carbidopa levodopa 25-100 times a day for now unless his treatment is changed by his neurologist at [facility] Continue Depakote 250 mg twice daily per psychiatry.  (She has been on the medication since the 1990s for bipolar disorder)   Subjective:   Timothy [name] is a 72 year old man with a history of hypertension, vitamin D deficiency, hyperglycemia, depression, OSA on CPAP, bipolar 1 disorder, degenerative scoliosis, who was recently seen by Dr. Everlena Cooper for gait instability.  Workup included a DaTscan, which was equivocal, with findings suggesting Parkinson syndrome, although very mild, and could be of no clinical significance.  In the interim, the patient is on carbidopa levodopa 25 100 mg 3 times daily per Dr. Everlena Cooper.   The patient has developed some mood changes, sundowning, which needed further evaluation.  He is also on  Depakote 250 mg to twice daily since the 1990s for bipolar disorder.  He is a fall risk, recommend using walker to prevent any other injuries. He is on a wheelchair because he cannot walk very fast.  My previous records were reviewed prior to todays visit as well as outside records available to me. Patient denies falls.  Patient denies lightheadedness, near syncope.  No hallucinations.  Mood has been good. Tremors are stable no changes. No tremors today.    Current prescribed movement disorder medications: Sinemet (carbidopa levodopa), 25-100 mg, 3 times daily     ALLERGIES:   Allergies  Allergen Reactions   Ciprofloxacin Diarrhea    Other Reaction(s): Unknown    CURRENT MEDICATIONS:  Outpatient Encounter Medications as of 02/02/2023  Medication Sig   aspirin EC 81 MG  tablet Take 81 mg by mouth daily. Swallow whole.   bisoprolol-hydrochlorothiazide (ZIAC) 5-6.25 MG tablet TAKE 1 TABLET BY MOUTH EVERY DAY FOR BLOOD PRESSURE   carbidopa-levodopa (SINEMET IR) 25-100 MG tablet TAKE 1 TABLET BY MOUTH THREE TIMES A DAY   Cholecalciferol (VITAMIN D-3) 125 MCG (5000 UT) TABS Take 5,000 Units by mouth every evening.    Cyanocobalamin (VITAMIN B-12) 1000 MCG SUBL Take 1 tablet SL Daily (Patient taking differently: Take 1,000 mcg by mouth daily.)   divalproex (DEPAKOTE) 250 MG DR tablet Take 2 tablets (500 mg total) by mouth 2 (two) times daily.   meclizine (ANTIVERT) 25 MG tablet Take 1/2 to 1 tablet 2 to 3 x /day as needed for Dizziness / Vertigo   [DISCONTINUED] gabapentin (NEURONTIN) 100 MG capsule Take 1 capsule (100 mg total) by mouth 3 (three) times daily.   No facility-administered encounter medications on file as of 02/02/2023.    Objective:   PHYSICAL EXAMINATION:    VITALS:   Vitals:   02/02/23 1422  BP: 138/74  Pulse: 68  SpO2: 97%  Weight: 172 lb (78 kg)  Height: 5\' 9"  (1.753 m)    GEN:  The patient appears stated age and is in NAD.    Neurological examination:  Orientation: The patient is alert and oriented x3. Cranial nerves: There is good facial symmetry without facial hypomimia.  The speech is fluent and clear. Soft palate rises symmetrically and there is no tongue deviation. Hearing is intact to conversational tone. Sensation: Sensation is intact  to light touch throughout Motor: Strength is at least antigravity x4.  Movement examination: Tone: There is mild increased tone on the right upper extremity with mild cogwheeling on the right hand, left hand normal  Abnormal movements: Patient has no tremors during today's visit.  No asterixis.  No fasciculations. Coordination:  There is no decremation with RAM's.   Gait and Station: The patient has  difficulty arising out of a deep-seated chair without the use of the hands. The patient's  stride length is short, and base is broad  I have reviewed and interpreted the following labs independently     Latest Ref Rng & Units 12/29/2022   11:57 AM 09/26/2022    3:13 PM 06/27/2022    3:06 PM  CMP  Glucose 65 - 99 mg/dL 93  93  93   BUN 7 - 25 mg/dL 29  30  22    Creatinine 0.70 - 1.28 mg/dL 4.09  8.11  9.14   Sodium 135 - 146 mmol/L 141  139  141   Potassium 3.5 - 5.3 mmol/L 4.6  4.5  4.4   Chloride 98 - 110 mmol/L 102  102  103   CO2 20 - 32 mmol/L 27  30  30    Calcium 8.6 - 10.3 mg/dL 78.2  95.6  9.5   Total Protein 6.1 - 8.1 g/dL 7.5  7.0  7.3   Total Bilirubin 0.2 - 1.2 mg/dL 0.5  0.3  0.3   AST 10 - 35 U/L 13  20  16    ALT 9 - 46 U/L 15  19  13           Latest Ref Rng & Units 12/29/2022   11:57 AM 09/26/2022    3:13 PM 06/27/2022    3:06 PM  CBC  WBC 3.8 - 10.8 Thousand/uL 9.1  9.0  8.6   Hemoglobin 13.2 - 17.1 g/dL 21.3  08.6  57.8   Hematocrit 38.5 - 50.0 % 44.0  42.0  40.1   Platelets 140 - 400 Thousand/uL 224  232  245       Lab Results  Component Value Date   TSH 1.02 09/26/2022     Total time spent on today's visit was 19 minutes, including both face-to-face time and nonface-to-face time.  Time included that spent on review of records (prior notes available to me/labs/imaging if pertinent), discussing treatment and goals, answering patient's questions and coordinating care.  Cc:  [name], MD

## 2023-03-28 ENCOUNTER — Encounter: Payer: Self-pay | Admitting: Internal Medicine

## 2023-03-28 ENCOUNTER — Ambulatory Visit (INDEPENDENT_AMBULATORY_CARE_PROVIDER_SITE_OTHER): Payer: PPO | Admitting: Internal Medicine

## 2023-03-28 VITALS — BP 136/78 | HR 82 | Temp 97.9°F | Resp 17 | Ht 69.0 in | Wt 178.6 lb

## 2023-03-28 DIAGNOSIS — Z79899 Other long term (current) drug therapy: Secondary | ICD-10-CM

## 2023-03-28 DIAGNOSIS — R7309 Other abnormal glucose: Secondary | ICD-10-CM | POA: Diagnosis not present

## 2023-03-28 DIAGNOSIS — F319 Bipolar disorder, unspecified: Secondary | ICD-10-CM

## 2023-03-28 DIAGNOSIS — E538 Deficiency of other specified B group vitamins: Secondary | ICD-10-CM | POA: Diagnosis not present

## 2023-03-28 DIAGNOSIS — E782 Mixed hyperlipidemia: Secondary | ICD-10-CM

## 2023-03-28 DIAGNOSIS — R269 Unspecified abnormalities of gait and mobility: Secondary | ICD-10-CM

## 2023-03-28 DIAGNOSIS — I10 Essential (primary) hypertension: Secondary | ICD-10-CM | POA: Diagnosis not present

## 2023-03-28 DIAGNOSIS — E559 Vitamin D deficiency, unspecified: Secondary | ICD-10-CM | POA: Diagnosis not present

## 2023-03-28 DIAGNOSIS — Z9181 History of falling: Secondary | ICD-10-CM

## 2023-03-28 NOTE — Patient Instructions (Signed)

## 2023-03-28 NOTE — Progress Notes (Signed)
Future Appointments  Date Time Provider Department  03/28/2023                       6 mo   2:30 PM [name], MD [facility]  09/26/2023                          cpe  2:00 PM [name], MD [facility]  10/24/2023  1:00 PM Cottle, Steva Ready., MD [facility]  11/02/2023  1:00 PM Butch Penny, NP [facility]  12/29/2023 10:30 AM Raynelle Dick, NP [facility]    History of Present Illness:       This very nice 72 y.o. MWM presents for 6 month follow up with HTN, HLD, Pre-Diabetes and Vitamin D Deficiency.  Dr Jennelle Human follows patient on Depakote for Bipolar Depression .  Patient has shuffling gait & frequent falls.  Patient also has c/o bilateral lower extremity weakness of obscure etiology & is followed by Dr Everlena Cooper and also has been evaluated at [facility]. Results of DaTscan are equivocal. Apparently patient has more  recently established at [facility].   Patient had a low Vit B12 level of  429 in Feb 2023 & 458 in July 2023.        Patient is treated for HTN (2012) & BP has been controlled at home. Today's BP is at goal - 136/78 . Patient has had no complaints of any cardiac type chest pain, palpitations, dyspnea Pollyann Kennedy /PND, dizziness, claudication or dependent edema.       Hyperlipidemia is controlled with diet & meds. Patient denies myalgias or other med SE's. Last Lipids were at goal :  Lab Results  Component Value Date   CHOL 166 09/26/2022   HDL 59 09/26/2022   LDLCALC 89 09/26/2022   TRIG 89 09/26/2022   CHOLHDL 2.8 09/26/2022     Also, the patient is monitored for glucose intolerance  and has had no symptoms of reactive hypoglycemia, diabetic polys, paresthesias or visual blurring.  Last A1c was normal & at goal :  Lab Results  Component Value Date   HGBA1C 5.5 09/26/2022                                                        Further, the patient also has history of Vitamin D Deficiency ("28" /2017)  and supplements vitamin D without any  suspected side-effects. Last vitamin D was at goal :   Lab Results  Component Value Date   VD25OH 92 09/26/2022       Current Outpatient Medications  Medication Instructions   aspirin EC   81 mg Daily    bisoprolol-hctz  5-6.25 MG tablet TAKE 1 TABLET  EVERY DAY    carbidopa-levodopa (SINEMET IR) 25-100 MG tablet TAKE 1 TABLET  THREE TIMES A DAY   VITAMIN B-12 1000 MCG SL Take 1 tablet SL Daily   divalproex (DEPAKOTE) 500 mg    2 times daily   meclizine (ANTIVERT) 25 MG tablet Take 1/2-1 tablet 2-3 x /day as needed    Vitamin D  5,000 Units  Every evening     Allergies  Allergen Reactions   Ciprofloxacin Diarrhea    PMHx:   Past Medical History:  Diagnosis Date  Arthritis    Bipolar disorder (HCC)    controlled by meds since 1997   Pneumonia    Sleep apnea    cpap   Wears glasses    reading     Immunization History  Administered Date(s) Administered   Moderna Sars-Covid-2 Vacc 07/02/2019, 07/30/2019   PFIZER-SARS-COV-2 Vacc 03/13/2020   Pneumococcal  - 13 04/05/2017   Pneumococcal  - 23 11/22/2018   Td 11/06/2017     Past Surgical History:  Procedure Laterality Date   APPENDECTOMY     COLONOSCOPY     KNEE ARTHROSCOPY W/ MENISCAL REPAIR  2004   left   ORIF HUMERUS FRACTURE Left 09/19/2013   Procedure: LEFT OPEN REDUCTION INTERNAL FIXATION (ORIF) PROXIMAL HUMERUS; ROTATOR CUFF REPAIR;  Surgeon: Loreta Ave, MD;  Location: [facility];  Service: Orthopedics;  Laterality: Left;   TONSILLECTOMY     TOTAL HIP ARTHROPLASTY Left 07/18/2018   Procedure: TOTAL HIP ARTHROPLASTY ANTERIOR APPROACH;  Surgeon: Samson Frederic, MD;  Location: [facility];  Service: Orthopedics;  Laterality: Left;    FHx:    Reviewed / unchanged  SHx:    Reviewed / unchanged   Systems Review:  Constitutional: Denies fever, chills, wt changes, headaches, insomnia, fatigue, night sweats, change in appetite. Eyes: Denies redness, blurred vision, diplopia, discharge, itchy,  watery eyes.  ENT: Denies discharge, congestion, post nasal drip, epistaxis, sore throat, earache, hearing loss, dental pain, tinnitus, vertigo, sinus pain, snoring.  CV: Denies chest pain, palpitations, irregular heartbeat, syncope, dyspnea, diaphoresis, orthopnea, PND, claudication or edema. Respiratory: denies cough, dyspnea, DOE, pleurisy, hoarseness, laryngitis, wheezing.  Gastrointestinal: Denies dysphagia, odynophagia, heartburn, reflux, water brash, abdominal pain or cramps, nausea, vomiting, bloating, diarrhea, constipation, hematemesis, melena, hematochezia  or hemorrhoids. Genitourinary: Denies dysuria, frequency, urgency, nocturia, hesitancy, discharge, hematuria or flank pain. Musculoskeletal: Denies arthralgias, myalgias, stiffness, jt. swelling, pain, limping or strain/sprain.  Skin: Denies pruritus, rash, hives, warts, acne, eczema or change in skin lesion(s). Neuro: No weakness, tremor, incoordination, spasms, paresthesia or pain. Psychiatric: Denies confusion, memory loss or sensory loss. Endo: Denies change in weight, skin or hair change.  Heme/Lymph: No excessive bleeding, bruising or enlarged lymph nodes.  Physical Exam  BP 136/78   Pulse 82   Temp 97.9 F (36.6 C)   Resp 17   Ht 5\' 9"  (1.753 m)   Wt 178 lb 9.6 oz (81 kg)   SpO2 94%   BMI 26.37 kg/m   Appears  well nourished, well groomed  and in no distress.  Eyes: PERRLA, EOMs, conjunctiva no swelling or erythema. Sinuses: No frontal/maxillary tenderness ENT/Mouth: EAC's clear, TM's nl w/o erythema, bulging. Nares clear w/o erythema, swelling, exudates. Oropharynx clear without erythema or exudates. Oral hygiene is good. Tongue normal, non obstructing. Hearing intact.  Neck: Supple. Thyroid not palpable. Car 2+/2+ without bruits, nodes or JVD. Chest: Respirations nl with BS clear & equal w/o rales, rhonchi, wheezing or stridor.  Cor: Heart sounds normal w/ regular rate and rhythm without sig. murmurs, gallops,  clicks or rubs. Peripheral pulses normal and equal  without edema.  Abdomen: Soft & bowel sounds normal. Non-tender w/o guarding, rebound, hernias, masses or organomegaly.  Lymphatics: Unremarkable.  Musculoskeletal:   Short steppage shuffling gait with poor balance. Sensory-motor testing grossly intact. Tendon reflexes grossly intact.  Skin: Warm, dry without exposed rashes, lesions or ecchymosis apparent.  Neuro: Cranial nerves intact, reflexes equal bilaterally.   Pysch: Alert & oriented x 3.  Insight and judgement nl & appropriate. No  ideations.  Assessment and Plan:   1. Essential hypertension  - Continue medication, monitor blood pressure at home.  - Continue DASH diet.  Reminder to go to the ER if any CP,  SOB, nausea, dizziness, severe HA, changes vision/speech.     - CBC with Differential/Platelet - COMPLETE METABOLIC PANEL WITH GFR - Magnesium - TSH   2. Hyperlipidemia, mixed  - Continue diet/meds, exercise,& lifestyle modifications.  - Continue monitor periodic cholesterol/liver & renal functions      - Lipid panel - TSH   3. Abnormal glucose  - Continue diet, exercise  - Lifestyle modifications.  - Monitor appropriate labs    - Hemoglobin A1c - Insulin, random   4. Vitamin D deficiency  - Continue supplementation    - VITAMIN D 25 Hydroxy    5. Gait disorder   6. At high risk for falls   7. B12 deficiency  - Vitamin B12   8. Bipolar I disorder (HCC)  - Valproic acid level   9. Medication management  - CBC with Differential/Platelet - COMPLETE METABOLIC PANEL WITH GFR - Magnesium - Lipid panel - TSH - Hemoglobin A1c - Insulin, random - VITAMIN D 25 Hydroxy - Vitamin B12 - Valproic acid level          Discussed  regular exercise, BP monitoring, weight control to achieve/maintain BMI less than 25 and discussed med and SE's. Recommended labs to assess and monitor clinical status with further disposition pending results of labs.   I discussed the assessment and treatment plan with the patient. The patient was provided an opportunity to ask questions and all were answered. The patient agreed with the plan and demonstrated an understanding of the instructions.  I provided over 30 minutes of exam, counseling, chart review and  complex critical decision making.         The patient was advised to call back or seek an in-person evaluation if the symptoms worsen or if the condition fails to improve as anticipated.   Marinus Maw, MD

## 2023-03-29 LAB — CBC WITH DIFFERENTIAL/PLATELET
Absolute Lymphocytes: 2970 {cells}/uL (ref 850–3900)
Absolute Monocytes: 774 {cells}/uL (ref 200–950)
Basophils Absolute: 18 {cells}/uL (ref 0–200)
Basophils Relative: 0.2 %
Eosinophils Absolute: 63 {cells}/uL (ref 15–500)
Eosinophils Relative: 0.7 %
HCT: 43.8 % (ref 38.5–50.0)
Hemoglobin: 14.3 g/dL (ref 13.2–17.1)
MCH: 29.1 pg (ref 27.0–33.0)
MCHC: 32.6 g/dL (ref 32.0–36.0)
MCV: 89 fL (ref 80.0–100.0)
MPV: 12.1 fL (ref 7.5–12.5)
Monocytes Relative: 8.6 %
Neutro Abs: 5175 {cells}/uL (ref 1500–7800)
Neutrophils Relative %: 57.5 %
Platelets: 227 10*3/uL (ref 140–400)
RBC: 4.92 10*6/uL (ref 4.20–5.80)
RDW: 13.3 % (ref 11.0–15.0)
Total Lymphocyte: 33 %
WBC: 9 10*3/uL (ref 3.8–10.8)

## 2023-03-29 LAB — COMPLETE METABOLIC PANEL WITH GFR
AG Ratio: 1.4 (calc) (ref 1.0–2.5)
ALT: 15 U/L (ref 9–46)
AST: 21 U/L (ref 10–35)
Albumin: 4.2 g/dL (ref 3.6–5.1)
Alkaline phosphatase (APISO): 59 U/L (ref 35–144)
BUN/Creatinine Ratio: 27 (calc) — ABNORMAL HIGH (ref 6–22)
BUN: 30 mg/dL — ABNORMAL HIGH (ref 7–25)
CO2: 29 mmol/L (ref 20–32)
Calcium: 9.8 mg/dL (ref 8.6–10.3)
Chloride: 103 mmol/L (ref 98–110)
Creat: 1.12 mg/dL (ref 0.70–1.28)
Globulin: 3 g/dL (ref 1.9–3.7)
Glucose, Bld: 96 mg/dL (ref 65–99)
Potassium: 4.6 mmol/L (ref 3.5–5.3)
Sodium: 142 mmol/L (ref 135–146)
Total Bilirubin: 0.4 mg/dL (ref 0.2–1.2)
Total Protein: 7.2 g/dL (ref 6.1–8.1)
eGFR: 70 mL/min/{1.73_m2} (ref >=60)

## 2023-03-29 LAB — TSH: TSH: 0.55 m[IU]/L (ref 0.40–4.50)

## 2023-03-29 LAB — LIPID PANEL
Cholesterol: 162 mg/dL (ref <200)
HDL: 52 mg/dL (ref >=40)
LDL Cholesterol (Calc): 79 mg/dL
Non-HDL Cholesterol (Calc): 110 mg/dL (ref <130)
Total CHOL/HDL Ratio: 3.1 (calc) (ref <5.0)
Triglycerides: 222 mg/dL — ABNORMAL HIGH (ref <150)

## 2023-03-29 LAB — VALPROIC ACID LEVEL: Valproic Acid Lvl: 41.5 mg/L — ABNORMAL LOW (ref 50.0–100.0)

## 2023-03-29 LAB — HEMOGLOBIN A1C
Hgb A1c MFr Bld: 5.5 %{Hb} (ref <5.7)
Mean Plasma Glucose: 111 mg/dL
eAG (mmol/L): 6.2 mmol/L

## 2023-03-29 LAB — MAGNESIUM: Magnesium: 2 mg/dL (ref 1.5–2.5)

## 2023-03-29 LAB — VITAMIN D 25 HYDROXY (VIT D DEFICIENCY, FRACTURES): Vit D, 25-Hydroxy: 71 ng/mL (ref 30–100)

## 2023-03-29 LAB — INSULIN, RANDOM: Insulin: 14.3 u[IU]/mL

## 2023-03-29 LAB — VITAMIN B12: Vitamin B-12: 747 pg/mL (ref 200–1100)

## 2023-03-29 NOTE — Progress Notes (Signed)
<>*<>*<>*<>*<>*<>*<>*<>*<>*<>*<>*<>*<>*<>*<>*<>*<>*<>*<>*<>*<>*<>*<>*<>*<> <>*<>*<>*<>*<>*<>*<>*<>*<>*<>*<>*<>*<>*<>*<>*<>*<>*<>*<>*<>*<>*<>*<>*<>*<>  -  Test results slightly outside the reference range are not unusual. If there is anything important, I will review this with you,  otherwise it is considered normal test values.  If you have further questions,  please do not hesitate to contact me at the office or via My Chart.   <>*<>*<>*<>*<>*<>*<>*<>*<>*<>*<>*<>*<>*<>*<>*<>*<>*<>*<>*<>*<>*<>*<>*<>*<> <>*<>*<>*<>*<>*<>*<>*<>*<>*<>*<>*<>*<>*<>*<>*<>*<>*<>*<>*<>*<>*<>*<>*<>*<>  -  Valproic Acid Level ( Depakote)   is a little low                      ( Sent the lab result to Dr Jennelle Human )   <>*<>*<>*<>*<>*<>*<>*<>*<>*<>*<>*<>*<>*<>*<>*<>*<>*<>*<>*<>*<>*<>*<>*<>*<> <>*<>*<>*<>*<>*<>*<>*<>*<>*<>*<>*<>*<>*<>*<>*<>*<>*<>*<>*<>*<>*<>*<>*<>*<>  -    BUN Is slightly elevated  & GFR  ( Kidney Flow Rate) is slightly decreased  - So   Kidney functions  look a little dehydrated   -  Very important to drink adequate amounts of fluids to prevent permanent damage    - Recommend drink at least 6 bottles (16 ounces) of fluids /water /day = 96 Oz ~100 oz  - 100 oz = 3,000 cc or 3 liters / day  - >> That's 1 &1/2 bottles of a 2 liter soda bottle /day !   <>*<>*<>*<>*<>*<>*<>*<>*<>*<>*<>*<>*<>*<>*<>*<>*<>*<>*<>*<>*<>*<>*<>*<>*<> <>*<>*<>*<>*<>*<>*<>*<>*<>*<>*<>*<>*<>*<>*<>*<>*<>*<>*<>*<>*<>*<>*<>*<>*<>  -  Chol = 162   &  LDL = 79   - Both   Excellent  !  - Very low risk for Heart Attack  / Stroke  <>*<>*<>*<>*<>*<>*<>*<>*<>*<>*<>*<>*<>*<>*<>*<>*<>*<>*<>*<>*<>*<>*<>*<>*<> <>*<>*<>*<>*<>*<>*<>*<>*<>*<>*<>*<>*<>*<>*<>*<>*<>*<>*<>*<>*<>*<>*<>*<>*<>  -  Vitamin D = 71    -     Excellent   !      Please keep dosage  same    <>*<>*<>*<>*<>*<>*<>*<>*<>*<>*<>*<>*<>*<>*<>*<>*<>*<>*<>*<>*<>*<>*<>*<>*<> <>*<>*<>*<>*<>*<>*<>*<>*<>*<>*<>*<>*<>*<>*<>*<>*<>*<>*<>*<>*<>*<>*<>*<>*<>  -  Vitamin B12 level is  Normal - Great   <>*<>*<>*<>*<>*<>*<>*<>*<>*<>*<>*<>*<>*<>*<>*<>*<>*<>*<>*<>*<>*<>*<>*<>*<> <>*<>*<>*<>*<>*<>*<>*<>*<>*<>*<>*<>*<>*<>*<>*<>*<>*<>*<>*<>*<>*<>*<>*<>*<>  -  All Else - CBC -  Electrolytes - Liver - Magnesium & Thyroid    - all  Normal / OK  <>*<>*<>*<>*<>*<>*<>*<>*<>*<>*<>*<>*<>*<>*<>*<>*<>*<>*<>*<>*<>*<>*<>*<>*<> <>*<>*<>*<>*<>*<>*<>*<>*<>*<>*<>*<>*<>*<>*<>*<>*<>*<>*<>*<>*<>*<>*<>*<>*<>

## 2023-04-01 ENCOUNTER — Encounter: Payer: Self-pay | Admitting: Internal Medicine

## 2023-04-08 ENCOUNTER — Other Ambulatory Visit: Payer: Self-pay | Admitting: Neurology

## 2023-04-11 DIAGNOSIS — R269 Unspecified abnormalities of gait and mobility: Secondary | ICD-10-CM | POA: Diagnosis not present

## 2023-04-11 DIAGNOSIS — G20C Parkinsonism, unspecified: Secondary | ICD-10-CM | POA: Diagnosis not present

## 2023-06-07 ENCOUNTER — Encounter: Payer: Self-pay | Admitting: Physical Therapy

## 2023-06-07 ENCOUNTER — Ambulatory Visit: Payer: PPO | Attending: Neurology | Admitting: Physical Therapy

## 2023-06-07 ENCOUNTER — Other Ambulatory Visit: Payer: Self-pay | Admitting: Neurology

## 2023-06-07 DIAGNOSIS — M6281 Muscle weakness (generalized): Secondary | ICD-10-CM | POA: Diagnosis present

## 2023-06-07 DIAGNOSIS — R2689 Other abnormalities of gait and mobility: Secondary | ICD-10-CM | POA: Insufficient documentation

## 2023-06-07 DIAGNOSIS — R29818 Other symptoms and signs involving the nervous system: Secondary | ICD-10-CM | POA: Insufficient documentation

## 2023-06-07 DIAGNOSIS — R2681 Unsteadiness on feet: Secondary | ICD-10-CM | POA: Diagnosis present

## 2023-06-07 DIAGNOSIS — R251 Tremor, unspecified: Secondary | ICD-10-CM

## 2023-06-07 NOTE — Therapy (Signed)
 OUTPATIENT PHYSICAL THERAPY NEURO EVALUATION   Patient Name: Timothy [name] MRN: [id number] DOB:02/11/1951, 73 y.o., male Today's Date: 06/07/2023   PCP: Tonita Fallow, MD   REFERRING PROVIDER: Hans Cinderella Duck, MD  END OF SESSION:  PT End of Session - 06/07/23 1420     Visit Number 1    Number of Visits 9    Date for PT Re-Evaluation 08/06/23    Authorization Type [employer]    PT Start Time 1235    PT Stop Time 1315    PT Time Calculation (min) 40 min    Equipment Utilized During Treatment Gait belt    Activity Tolerance Patient tolerated treatment well    Behavior During Therapy WFL for tasks assessed/performed             Past Medical History:  Diagnosis Date   Arthritis    Bipolar disorder (HCC)    controlled by meds since 1997   Cancer (HCC)    basal cell and pre melanoma on back   Neuromuscular disorder (HCC)    gait and ambulation problems, but no diagnosis from Neurology   Pneumonia    Sleep apnea    cpap   Wears glasses    reading   Past Surgical History:  Procedure Laterality Date   APPENDECTOMY     COLONOSCOPY     KNEE ARTHROSCOPY W/ MENISCAL REPAIR  2004   left   ORIF HUMERUS FRACTURE Left 09/19/2013   Procedure: LEFT OPEN REDUCTION INTERNAL FIXATION (ORIF) PROXIMAL HUMERUS; ROTATOR CUFF REPAIR;  Surgeon: Toribio JULIANNA Chancy, MD;  Location: [facility];  Service: Orthopedics;  Laterality: Left;   REVERSE SHOULDER ARTHROPLASTY Right 05/27/2022   Procedure: REVERSE SHOULDER ARTHROPLASTY;  Surgeon: Sharl Selinda Dover, MD;  Location: [facility];  Service: Orthopedics;  Laterality: Right;  120   TONSILLECTOMY     TOTAL HIP ARTHROPLASTY Left 07/18/2018   Procedure: TOTAL HIP ARTHROPLASTY ANTERIOR APPROACH;  Surgeon: Fidel Rogue, MD;  Location: [facility];  Service: Orthopedics;  Laterality: Left;   Patient Active Problem List   Diagnosis Date Noted   S/P reverse total shoulder arthroplasty, right 05/27/2022    Degenerative scoliosis in adult patient 03/06/2020   Bipolar I disorder (HCC) 12/03/2019   OSA on CPAP 11/22/2018   S/P total hip arthroplasty 07/19/2018   History of adenomatous polyp of colon 11/09/2017   Depression 07/21/2016   HTN (hypertension) 03/14/2016   Vitamin D  deficiency 03/14/2016   Abnormal glucose 03/14/2016    ONSET DATE: 05/05/2023  REFERRING DIAG: R26.9 (ICD-10-CM) - Unspecified abnormalities of gait and mobility  THERAPY DIAG:  Other abnormalities of gait and mobility  Other symptoms and signs involving the nervous system  Unsteadiness on feet  Muscle weakness (generalized)  Rationale for Evaluation and Treatment: Rehabilitation  SUBJECTIVE:  SUBJECTIVE STATEMENT: Was prescribed Carbidopa  Levodopa  last May and reports it did not help his symptoms. Saw a neurologist at [facility] in November and was prescribed more of a dose and reports it ran out in the past week and a half. Still reports no difference. Pt sitting in chair in waiting room and requests to be wheeled back in transport chair. Saw the neurologist in November and was recently diagnosed with Parkinsonism. Has had freezing for the last few months. Reports R foot gets stuck to the floor and has to be conscious about picking it up. Has had walking issues for a little over 2 years and it has progressively gotten worse. Has a cane in his car and in the house. Ends up holding the cane. Has a RW, but reports that does not help him. Reports does best with no AD when he focuses on picking up his feet like he is walking up stairs. Has not had any recent falls. Reports everything that requires walking is a challenge, has to be careful with turning and changing directions. That is when he tends to lose his balance. Pt reports he is still  driving and no issues. Does grab onto things when he walks. Has to use the scooter when going to the grocery store. He has some urinary urgency but no incontinence.   Pt accompanied by: self  PERTINENT HISTORY: PMH: bipolar disorder (1997), HLD, HTN, OSA on CPAP, reverse R shoulder arthroplastic 04/2022, L total hip replacement 2020.   Per neurologist on 04/11/23: He was evaluated for gait impairment. He has a history of left hip replacement and left shoulder repair. He had EMG in 2022 with a chronic L5 radiculopathy on right lower extremity. MRI brain showed L frontal infarct and other smaller infarcts. MRI C and T spine was unremarkable. EMG 09/2021 then was normal. He saw Dr. Skeet at [facility] who wondered about PD. DAT scan 08/2022 was positive. He started Sinemet  but did not noticed improvements.  Today he reports that he has had walking and balance problems since 2022. This started in the spring 2022. He wonders if the COVID booster which preceded his symptoms could be to blame. He underwent the workup as described above and it was thought that his gait impairments were related to spinal disease. However his gait has continued to decline. While he can get around the house he has to use a cane sometimes and has come today in a wheelchair given the longer distance. He often limps and has to hold and grab his leg to pull it across to ambulate. He has had a few falls in the past year. He loses his balance if he is not careful and watching how he steps. He feels like his right leg is weaker. Turns are very challenging and when trying to multitask such as carrying something while walking, he will be at higher risk of falling. He admits when I describe freezing of gait that this is what he experiences especially with his right leg. He denies festinating gait. He has never completed PT for these problems since onset of symptoms.   PAIN:  Are you having pain? No Has weakness, but no real pain.    PRECAUTIONS: Fall  FALLS: Has patient fallen in last 6 months? Yes. Number of falls 0-1  LIVING ENVIRONMENT: Lives with: lives alone, has family that lives close by, a bunch of kids and grandkids  Lives in: House/apartment Stairs: No, reports stairs are a little easier with picking up  feet  Has following equipment at home: Single point cane and Has a walker with no wheels   PLOF: Independent, Vocation/Vocational requirements: Works part time as a clinical research associate , and Leisure: likes to smoke barbecue   PATIENT GOALS: Wants to improve his walking   OBJECTIVE:  Note: Objective measures were completed at Evaluation unless otherwise noted.  DIAGNOSTIC FINDINGS: DAT SCAN 08/2022: IMPRESSION: Moderate decreased relative radiotracer activity posterior LEFT putamen. Equivocal exam could indicate early Parkinsonian syndrome pathology.  MRI brain 04/29/21: IMPRESSION: Mildly motion degraded exam.   No evidence of acute intracranial abnormality.   Small chronic cortically-based infarct within the medial right parietal lobe.   Chronic lacunar infarct within the posterior left frontal lobe white matter. Minimal background cerebral white matter chronic small vessel ischemic disease.   Mild-to-moderate generalized cerebral and cerebellar atrophy.  COGNITION: Overall cognitive status: Within functional limits for tasks assessed and Pt denies changes in thinking or memory.    SENSATION: WFL for light touch at BLE  COORDINATION: Heel to shin: Bradykinetic and harder to perform with RLE    MUSCLE TONE: Mild rigidity RLE     POSTURE: rounded shoulders, forward flexed posture in standing with gait   LOWER EXTREMITY ROM:    Pt with bilateral knee flexion in standing    LOWER EXTREMITY MMT:    MMT Right Eval Left Eval  Hip flexion 4- 4  Hip extension    Hip abduction    Hip adduction    Hip internal rotation    Hip external rotation    Knee flexion 4+ 4+  Knee extension 4+ 5   Ankle dorsiflexion 3- 4  Ankle plantarflexion    Ankle inversion    Ankle eversion    (Blank rows = not tested)  BED MOBILITY:  Pt reports difficulty sometimes getting up due to R shoulder (hx of R shoulder surgery at end of 2023)  TRANSFERS: Assistive device utilized: None  Sit to stand: SBA and CGA Stand to sit: SBA and CGA  Performs with no UE support, decr anterior weight shift, pt with knees flexed in standing   Pt with initiation freezing in standing, before pt goes to walk.   GAIT: Gait pattern: decreased arm swing- Right, decreased arm swing- Left, decreased step length- Right, decreased step length- Left, decreased stride length, decreased ankle dorsiflexion- Right, decreased ankle dorsiflexion- Left, Right foot flat, Left foot flat, shuffling, decreased trunk rotation, trunk flexed, wide BOS, and poor foot clearance- Right Distance walked: Clinic distances  Assistive device utilized: None Level of assistance: Min A Comments: Pt with freezing episodes during straight aways and when turning/going through doorways. Needs intermittent cues to stop and re-set and weight shift to re-start gait. Pt with shuffled gait and stutter steps, esp with RLE.   Trialed a 2 wheeled RW for safety with gait (pt reports he has one at home but does not like it as it gets too far in front of him). With gait with RW, cued to stay inside it and incr stride length and think about stepping past front wheels. Pt still needing min A and still with freezing/shuffled gait. Pt did do better when stepping inside RW   Pt ambulated into waiting room with no AD and requested to be wheeled back to evaluation room in manual clinic w/c. Pt also being wheeled out at end of session in manual w/c to desk where pt got scheduled and also out to pt's car.   FUNCTIONAL TESTS:  5 times sit  to stand: 16.6 seconds with no UE support, bracing with BLE on chair  Timed up and go (TUG): 21.22 seconds with min A when turning  and for freezing episodes during turns and straight away   10 meter walk test: 22.44 seconds = 1.46 ft/sec with min A and no AD  PATIENT SURVEYS:  Freezing of gait questionnaire 12/24 (higher score denotes more severe freezing of gait)                                                                                                                              TREATMENT DATE:  N/A during eval.    PATIENT EDUCATION: Education details: Clinical findings, POC, using an AD to decr fall risk and discussed U-Step Rollator and will trial at next session, reaching out to neurologist about pt noticing no change in symptoms/gait with incr in Carbidopa /Levodopa   Person educated: Patient Education method: Explanation Education comprehension: verbalized understanding  HOME EXERCISE PROGRAM: Will provide at future session.   GOALS: Goals reviewed with patient? Yes  SHORT TERM GOALS: ALL STGS = LTGS  LONG TERM GOALS: Target date: 07/05/2023  Pt will be independent with final HEP for gait/balance in order to build upon functional gains made in therapy. Baseline:  Goal status: INITIAL  2.  Pt will verbalize/demo understanding of techniques to help with freezing episodes.  Baseline:  Goal status: INITIAL  3.  Pt will decr FOGQ to 9/24 or less in order to demo improved freezing.  Baseline: 12/24  Goal status: INITIAL  4.  Pt will improve TUG time to 17 seconds or less with LRAD vs. No AD with CGA in order to demo decrease fall risk.  Baseline: 21.22 seconds with min A  Goal status: INITIAL  5.  Pt will improve gait speed with LRAD to at least 1.8 ft/sec in order to demo improved community mobility.   Baseline: 22.44 seconds = 1.46 ft/sec with min A and no AD  Goal status: INITIAL  6.  Pt will improve 5x sit<>stand to less than or equal to 14 sec to demonstrate improved functional strength and transfer efficiency.   Baseline: 16.6 seconds with no UE support, bracing with BLE on chair   Goal status: INITIAL  ASSESSMENT:  CLINICAL IMPRESSION: Patient is a 73 year old male referred to Neuro OPPT for PT for gait abnormality.   Pt's PMH is significant for: bipolar disorder (1997), HLD, HTN, OSA on CPAP, reverse R shoulder arthroplastic 04/2022, L total hip replacement 2020. Per neurologist visit, stated that pt likely does have some form of Parkinsonism, likely freezing of gait variant. Pt reports he has been on Carbidopa  Levodopa  since last May and it was incr after last neurologist appt, with pt reporting no change in his symptoms. Educated to pt to let his neurologist know regarding this. The following deficits were present during the exam: impaired balance, gait abnormalities, shuffling and freezing of gait, bradykinesia, postural abnormalities, decr strength, decr  activity tolerance . Based on TUG, 5x sit <> stand, and gait speed, pt is an incr risk for falls. Pt needing min A during gait with no AD for freezing episodes. Pt reports he does not use an AD at home and reports a walker or cane is more challenging for him compared to no AD. Pt scored a 12/24 on the FOGQ, indicating incr difficulty with freezing during gait.  Pt would benefit from skilled PT to address these impairments and functional limitations to maximize functional mobility independence and decr fall risk.    OBJECTIVE IMPAIRMENTS: Abnormal gait, decreased activity tolerance, decreased balance, decreased coordination, decreased knowledge of use of DME, decreased mobility, difficulty walking, decreased ROM, decreased strength, impaired flexibility, and postural dysfunction.   ACTIVITY LIMITATIONS: standing, squatting, transfers, bed mobility, and locomotion level  PARTICIPATION LIMITATIONS: community activity  PERSONAL FACTORS: Age, Behavior pattern, Past/current experiences, Time since onset of injury/illness/exacerbation, and 3+ comorbidities: bipolar disorder (1997), HLD, HTN, OSA on CPAP, reverse R shoulder  arthroplastic 04/2022, L total hip replacement 2020.   are also affecting patient's functional outcome.   REHAB POTENTIAL: Good  CLINICAL DECISION MAKING: Evolving/moderate complexity  EVALUATION COMPLEXITY: Moderate  PLAN:  PT FREQUENCY: 2x/week  PT DURATION: 8 weeks  PLANNED INTERVENTIONS: 97164- PT Re-evaluation, 97110-Therapeutic exercises, 97530- Therapeutic activity, 97112- Neuromuscular re-education, 97535- Self Care, [id number]- Manual therapy, [phone]- Gait training, Patient/Family education, Balance training, Stair training, and DME instructions  PLAN FOR NEXT SESSION: Work on freezing strategies. Try U-Step rollator or keep trying 2 wheeled RW for incr safety with gait. Initiate HEP for weight shifting/balance as able.    Sheffield LOISE Senate, PT ,DPT 06/07/2023, 2:21 PM

## 2023-06-12 ENCOUNTER — Ambulatory Visit: Payer: PPO | Admitting: Nurse Practitioner

## 2023-06-12 ENCOUNTER — Ambulatory Visit: Payer: PPO | Admitting: Physical Therapy

## 2023-06-12 ENCOUNTER — Encounter: Payer: Self-pay | Admitting: Physical Therapy

## 2023-06-12 DIAGNOSIS — R2681 Unsteadiness on feet: Secondary | ICD-10-CM

## 2023-06-12 DIAGNOSIS — R2689 Other abnormalities of gait and mobility: Secondary | ICD-10-CM | POA: Diagnosis not present

## 2023-06-12 DIAGNOSIS — R29818 Other symptoms and signs involving the nervous system: Secondary | ICD-10-CM

## 2023-06-12 DIAGNOSIS — M6281 Muscle weakness (generalized): Secondary | ICD-10-CM

## 2023-06-12 NOTE — Patient Instructions (Signed)
Tips to reduce freezing episodes with standing or walking:  Stand tall with your feet wide, so that you can rock and weight shift through your hips. Don't try to fight the freeze: if you begin taking slower, faster, smaller steps, STOP, get your posture tall, and RESET your posture and balance.  Take a deep breath before taking the BIG step to start again. March in place, with high knee stepping, to get started walking again. Use auditory cues:  Count out loud, think of a familiar tune or song or cadence, use pocket metronome, to use rhythm to get started walking again. Use visual cues:  Use a line to step over, use laser pointer line to step over, (using BIG steps) to start walking again. Use visual targets to keep your posture tall (look ahead and focus on an object or target at eye level). As you approach where your destination with walking, count your steps out loud and/or focus on your target with your eyes until you are fully there. Use appropriate assistive device, as advised by your physical therapist to assist with taking longer, consistent steps.  

## 2023-06-12 NOTE — Therapy (Addendum)
 OUTPATIENT PHYSICAL THERAPY NEURO TREATMENT   Patient Name: Timothy [name] MRN: [id number] DOB:Sep 18, 1950, 73 y.o., male Today's Date: 06/12/2023   PCP: Tonita Fallow, MD   REFERRING PROVIDER: Hans Cinderella Duck, MD  END OF SESSION:  PT End of Session - 06/12/23 0850     Visit Number 2    Number of Visits 9    Date for PT Re-Evaluation 08/06/23    Authorization Type [employer]    PT Start Time [phone]   pt using restroom at start of session   PT Stop Time 0929    PT Time Calculation (min) 39 min    Equipment Utilized During Treatment Gait belt    Activity Tolerance Patient tolerated treatment well    Behavior During Therapy WFL for tasks assessed/performed             Past Medical History:  Diagnosis Date   Arthritis    Bipolar disorder (HCC)    controlled by meds since 1997   Cancer (HCC)    basal cell and pre melanoma on back   Neuromuscular disorder (HCC)    gait and ambulation problems, but no diagnosis from Neurology   Pneumonia    Sleep apnea    cpap   Wears glasses    reading   Past Surgical History:  Procedure Laterality Date   APPENDECTOMY     COLONOSCOPY     KNEE ARTHROSCOPY W/ MENISCAL REPAIR  2004   left   ORIF HUMERUS FRACTURE Left 09/19/2013   Procedure: LEFT OPEN REDUCTION INTERNAL FIXATION (ORIF) PROXIMAL HUMERUS; ROTATOR CUFF REPAIR;  Surgeon: Toribio JULIANNA Chancy, MD;  Location: [facility];  Service: Orthopedics;  Laterality: Left;   REVERSE SHOULDER ARTHROPLASTY Right 05/27/2022   Procedure: REVERSE SHOULDER ARTHROPLASTY;  Surgeon: Sharl Selinda Dover, MD;  Location: [facility];  Service: Orthopedics;  Laterality: Right;  120   TONSILLECTOMY     TOTAL HIP ARTHROPLASTY Left 07/18/2018   Procedure: TOTAL HIP ARTHROPLASTY ANTERIOR APPROACH;  Surgeon: Fidel Rogue, MD;  Location: [facility];  Service: Orthopedics;  Laterality: Left;   Patient Active Problem List   Diagnosis Date Noted   S/P reverse total shoulder  arthroplasty, right 05/27/2022   Degenerative scoliosis in adult patient 03/06/2020   Bipolar I disorder (HCC) 12/03/2019   OSA on CPAP 11/22/2018   S/P total hip arthroplasty 07/19/2018   History of adenomatous polyp of colon 11/09/2017   Depression 07/21/2016   HTN (hypertension) 03/14/2016   Vitamin D  deficiency 03/14/2016   Abnormal glucose 03/14/2016    ONSET DATE: 05/05/2023  REFERRING DIAG: R26.9 (ICD-10-CM) - Unspecified abnormalities of gait and mobility  THERAPY DIAG:  Other symptoms and signs involving the nervous system  Other abnormalities of gait and mobility  Unsteadiness on feet  Muscle weakness (generalized)  Rationale for Evaluation and Treatment: Rehabilitation  SUBJECTIVE:  SUBJECTIVE STATEMENT: No changes since he was last here.   Pt accompanied by: self  PERTINENT HISTORY: PMH: bipolar disorder (1997), HLD, HTN, OSA on CPAP, reverse R shoulder arthroplastic 04/2022, L total hip replacement 2020.   Per neurologist on 04/11/23: He was evaluated for gait impairment. He has a history of left hip replacement and left shoulder repair. He had EMG in 2022 with a chronic L5 radiculopathy on right lower extremity. MRI brain showed L frontal infarct and other smaller infarcts. MRI C and T spine was unremarkable. EMG 09/2021 then was normal. He saw Dr. Skeet at [facility] who wondered about PD. DAT scan 08/2022 was positive. He started Sinemet  but did not noticed improvements.  Today he reports that he has had walking and balance problems since 2022. This started in the spring 2022. He wonders if the COVID booster which preceded his symptoms could be to blame. He underwent the workup as described above and it was thought that his gait impairments were related to spinal disease. However  his gait has continued to decline. While he can get around the house he has to use a cane sometimes and has come today in a wheelchair given the longer distance. He often limps and has to hold and grab his leg to pull it across to ambulate. He has had a few falls in the past year. He loses his balance if he is not careful and watching how he steps. He feels like his right leg is weaker. Turns are very challenging and when trying to multitask such as carrying something while walking, he will be at higher risk of falling. He admits when I describe freezing of gait that this is what he experiences especially with his right leg. He denies festinating gait. He has never completed PT for these problems since onset of symptoms.   PAIN:  Are you having pain? No   PRECAUTIONS: Fall  FALLS: Has patient fallen in last 6 months? Yes. Number of falls 0-1  LIVING ENVIRONMENT: Lives with: lives alone, has family that lives close by, a bunch of kids and grandkids  Lives in: House/apartment Stairs: No, reports stairs are a little easier with picking up feet  Has following equipment at home: Single point cane and Has a walker with no wheels   PLOF: Independent, Vocation/Vocational requirements: Works part time as a clinical research associate , and Leisure: likes to smoke barbecue   PATIENT GOALS: Wants to improve his walking   OBJECTIVE:  Note: Objective measures were completed at Evaluation unless otherwise noted.  DIAGNOSTIC FINDINGS: DAT SCAN 08/2022: IMPRESSION: Moderate decreased relative radiotracer activity posterior LEFT putamen. Equivocal exam could indicate early Parkinsonian syndrome pathology.  MRI brain 04/29/21: IMPRESSION: Mildly motion degraded exam.   No evidence of acute intracranial abnormality.   Small chronic cortically-based infarct within the medial right parietal lobe.   Chronic lacunar infarct within the posterior left frontal lobe white matter. Minimal background cerebral white matter  chronic small vessel ischemic disease.   Mild-to-moderate generalized cerebral and cerebellar atrophy.  COGNITION: Overall cognitive status: Within functional limits for tasks assessed and Pt denies changes in thinking or memory.    SENSATION: WFL for light touch at BLE  COORDINATION: Heel to shin: Bradykinetic and harder to perform with RLE    MUSCLE TONE: Mild rigidity RLE     POSTURE: rounded shoulders, forward flexed posture in standing with gait   LOWER EXTREMITY ROM:    Pt with bilateral knee flexion in standing  LOWER EXTREMITY MMT:    MMT Right Eval Left Eval  Hip flexion 4- 4  Hip extension    Hip abduction    Hip adduction    Hip internal rotation    Hip external rotation    Knee flexion 4+ 4+  Knee extension 4+ 5  Ankle dorsiflexion 3- 4  Ankle plantarflexion    Ankle inversion    Ankle eversion    (Blank rows = not tested)  BED MOBILITY:  Pt reports difficulty sometimes getting up due to R shoulder (hx of R shoulder surgery at end of 2023)  TRANSFERS: Assistive device utilized: None  Sit to stand: SBA and CGA Stand to sit: SBA and CGA  Performs with no UE support, decr anterior weight shift, pt with knees flexed in standing   Pt with initiation freezing in standing, before pt goes to walk.    FUNCTIONAL TESTS:  5 times sit to stand: 16.6 seconds with no UE support, bracing with BLE on chair  Timed up and go (TUG): 21.22 seconds with min A when turning and for freezing episodes during turns and straight away   10 meter walk test: 22.44 seconds = 1.46 ft/sec with min A and no AD  PATIENT SURVEYS:  Freezing of gait questionnaire 12/24 (higher score denotes more severe freezing of gait)                                                                                                                              TREATMENT DATE:   GAIT: Gait pattern: decreased arm swing- Right, decreased arm swing- Left, decreased step length- Right,  decreased step length- Left, decreased stride length, decreased ankle dorsiflexion- Right, decreased ankle dorsiflexion- Left, Right foot flat, Left foot flat, shuffling, decreased trunk rotation, trunk flexed, wide BOS, and poor foot clearance- Right Distance walked: Clinic distances  Assistive device utilized: U-Step Rollator, 2 wheeled RW, None  Level of assistance: Min A  Pt ambulating into clinic with no AD and close CGA/min A for balance, pt with wide BOS, shuffled steps and freezing/festinating episodes.   Comments:   51' with U-Step rollator: trialed U-Step and utilized laser feature to try to help with step length/freezing episodes. Cued pt to try to keep feet under the seat. Pt reporting that the seat would mess him up when trying to step closer to the walker. Pt with tendency to then have U-Step Rollator too far in front of him and still with shuffled steps, esp with RLE. Cued to try to step foot onto laser to help with visual cue. Pt reporting that this did not work for him.   55' with 2 wheeled RW: had theraband tied around RW as visual cue on what to step to with cues to try to step to the band and stay inside RW. Pt reporting that this cue did not work for him. Educated pt to just focus on BIG/MARCHING steps esp with RLE and trying  to pick it up. Pt reporting that this worked better for him. Discussed big focus, esp when ambulating around curves for foot clearance. Pt did well staying inside RW for the most part and during 2nd lap, pt with only 1 significant freezing episode. Pt able to ambulate with CGA/close supervision with RW. Educated that pt's gait looked best with RW and pt looked more stable and was able to incr step length.   28' with no AD: pt with frequent freezing/festination episodes with wide BOS with PT needing to provide close CGA/min A, pt needing to grab onto pieces of equipment intermittently for balance, pt reports that he does this at home. Discussed that due to  pt's fall risk and pt needing to furniture surf that pt should be using an AD at all times due to fall risk.   Therapeutic Activity: Went over freezing strategies during gait/doorways to decr fall risk and provided handout (went over making to stop, reset, and weight shift before taking a big step) With initiation freezing, when going to stand up to stop and stand tall and shift weight before taking a big step before beginning to walk, practiced this during session with pt needing verbal cues for technique.  With turning cued to stay inside RW and focus on marching with picking up feet  Going through doorways, discussed stopping to reset and shift weight or looking at a target through the door way to walk to with big steps, performed 2 reps going through doorways with use of RW with pt needing cues to perform. Pt reporting not really thinking about it when going through doorways, but did appear more steady with less freezing with use of RW.  Showed pt where to purchase a 2 wheeled RW off [employer] (pt ordered one during session), educated on proper height of walker and also gave pt tennis balls to put on walker for easier propulsion    PATIENT EDUCATION: Education details: See therapeutic activity section above, reaching out to neurologist about pt noticing no change in symptoms/gait with incr in Carbidopa /Levodopa  after taking incr dose in medication for over a month. Freezing strategies and providing handout. Using 2 wheeled RW to decr fall risk.   Person educated: Patient Education method: Explanation, Demonstration, Verbal cues, and Handouts Education comprehension: verbalized understanding and returned demonstration  HOME EXERCISE PROGRAM: Will provide at future session.   GOALS: Goals reviewed with patient? Yes  SHORT TERM GOALS: ALL STGS = LTGS  LONG TERM GOALS: Target date: 07/05/2023  Pt will be independent with final HEP for gait/balance in order to build upon functional gains made in  therapy. Baseline:  Goal status: INITIAL  2.  Pt will verbalize/demo understanding of techniques to help with freezing episodes.  Baseline:  Goal status: INITIAL  3.  Pt will decr FOGQ to 9/24 or less in order to demo improved freezing.  Baseline: 12/24  Goal status: INITIAL  4.  Pt will improve TUG time to 17 seconds or less with LRAD vs. No AD with CGA in order to demo decrease fall risk.  Baseline: 21.22 seconds with min A  Goal status: INITIAL  5.  Pt will improve gait speed with LRAD to at least 1.8 ft/sec in order to demo improved community mobility.   Baseline: 22.44 seconds = 1.46 ft/sec with min A and no AD  Goal status: INITIAL  6.  Pt will improve 5x sit<>stand to less than or equal to 14 sec to demonstrate improved functional strength and transfer efficiency.  Baseline: 16.6 seconds with no UE support, bracing with BLE on chair  Goal status: INITIAL   ASSESSMENT:  CLINICAL IMPRESSION: Trialed different walkers today to help with gait to decr fall risk. Pt ambulates with no AD with wide BOS, shuffled/festinating gait, decr stride length, and decr foot clearance esp with RLE. Pt needing close CGA/min A. Pt did not respond well with use of U-Step rollator and had a tendency to have it too far anteriorly. Performed with 2 wheeled RW with pt responding well to cues for longer and marching steps esp with RLE with pt able to stay closer to this walker and did demo an improvement in his freezing. Educated pt on using RW to decr fall risk. Pt in agreement and ordered one off [employer] during session. Remainder of session focused on freezing strategies and provided handout. Pt needs cues to stop and reset and shift weight, esp for initiation freezing. Will continue per POC.    OBJECTIVE IMPAIRMENTS: Abnormal gait, decreased activity tolerance, decreased balance, decreased coordination, decreased knowledge of use of DME, decreased mobility, difficulty walking, decreased ROM,  decreased strength, impaired flexibility, and postural dysfunction.   ACTIVITY LIMITATIONS: standing, squatting, transfers, bed mobility, and locomotion level  PARTICIPATION LIMITATIONS: community activity  PERSONAL FACTORS: Age, Behavior pattern, Past/current experiences, Time since onset of injury/illness/exacerbation, and 3+ comorbidities: bipolar disorder (1997), HLD, HTN, OSA on CPAP, reverse R shoulder arthroplastic 04/2022, L total hip replacement 2020.   are also affecting patient's functional outcome.   REHAB POTENTIAL: Good  CLINICAL DECISION MAKING: Evolving/moderate complexity  EVALUATION COMPLEXITY: Moderate  PLAN:  PT FREQUENCY: 2x/week  PT DURATION: 8 weeks  PLANNED INTERVENTIONS: 97164- PT Re-evaluation, 97110-Therapeutic exercises, 97530- Therapeutic activity, 97112- Neuromuscular re-education, 97535- Self Care, [id number]- Manual therapy, [phone]- Gait training, Patient/Family education, Balance training, Stair training, and DME instructions  PLAN FOR NEXT SESSION: Work on freezing strategies. Work on gait with RW. Try SciFit at start of session.  Initiate HEP for weight shifting/balance as able.    Sheffield LOISE Senate, PT ,DPT 06/12/2023, 9:31 AM

## 2023-06-14 ENCOUNTER — Ambulatory Visit: Payer: PPO | Admitting: Physical Therapy

## 2023-06-14 DIAGNOSIS — M6281 Muscle weakness (generalized): Secondary | ICD-10-CM

## 2023-06-14 DIAGNOSIS — R2681 Unsteadiness on feet: Secondary | ICD-10-CM

## 2023-06-14 DIAGNOSIS — R2689 Other abnormalities of gait and mobility: Secondary | ICD-10-CM | POA: Diagnosis not present

## 2023-06-14 NOTE — Therapy (Signed)
 OUTPATIENT PHYSICAL THERAPY NEURO TREATMENT   Patient Name: Timothy [name] MRN: [id number] DOB:1951-04-23, 73 y.o., male Today's Date: 06/14/2023   PCP: Vangie Genet, MD   REFERRING PROVIDER: Carlton Chick, MD  END OF SESSION:  PT End of Session - 06/14/23 1322     Visit Number 3    Number of Visits 9    Date for PT Re-Evaluation 08/06/23    Authorization Type [employer]    PT Start Time 1320    PT Stop Time 1400    PT Time Calculation (min) 40 min    Equipment Utilized During Treatment Gait belt    Activity Tolerance Patient tolerated treatment well    Behavior During Therapy WFL for tasks assessed/performed             Past Medical History:  Diagnosis Date   Arthritis    Bipolar disorder (HCC)    controlled by meds since 1997   Cancer (HCC)    basal cell and pre melanoma on back   Neuromuscular disorder (HCC)    gait and ambulation problems, but no diagnosis from Neurology   Pneumonia    Sleep apnea    cpap   Wears glasses    reading   Past Surgical History:  Procedure Laterality Date   APPENDECTOMY     COLONOSCOPY     KNEE ARTHROSCOPY W/ MENISCAL REPAIR  2004   left   ORIF HUMERUS FRACTURE Left 09/19/2013   Procedure: LEFT OPEN REDUCTION INTERNAL FIXATION (ORIF) PROXIMAL HUMERUS; ROTATOR CUFF REPAIR;  Surgeon: Ferd Householder, MD;  Location: [facility];  Service: Orthopedics;  Laterality: Left;   REVERSE SHOULDER ARTHROPLASTY Right 05/27/2022   Procedure: REVERSE SHOULDER ARTHROPLASTY;  Surgeon: Janeth Medicus, MD;  Location: [facility];  Service: Orthopedics;  Laterality: Right;  120   TONSILLECTOMY     TOTAL HIP ARTHROPLASTY Left 07/18/2018   Procedure: TOTAL HIP ARTHROPLASTY ANTERIOR APPROACH;  Surgeon: Adonica Hoose, MD;  Location: [facility];  Service: Orthopedics;  Laterality: Left;   Patient Active Problem List   Diagnosis Date Noted   S/P reverse total shoulder arthroplasty, right 05/27/2022    Degenerative scoliosis in adult patient 03/06/2020   Bipolar I disorder (HCC) 12/03/2019   OSA on CPAP 11/22/2018   S/P total hip arthroplasty 07/19/2018   History of adenomatous polyp of colon 11/09/2017   Depression 07/21/2016   HTN (hypertension) 03/14/2016   Vitamin D  deficiency 03/14/2016   Abnormal glucose 03/14/2016    ONSET DATE: 05/05/2023  REFERRING DIAG: R26.9 (ICD-10-CM) - Unspecified abnormalities of gait and mobility  THERAPY DIAG:  Unsteadiness on feet  Other abnormalities of gait and mobility  Muscle weakness (generalized)  Rationale for Evaluation and Treatment: Rehabilitation  SUBJECTIVE:  SUBJECTIVE STATEMENT: Pt ambulated into clinic without AD. States his RW should be here Friday. No falls or pain today. Forgot to reach out to neurologist about medicine, will call today.   Pt accompanied by: self  PERTINENT HISTORY: PMH: bipolar disorder (1997), HLD, HTN, OSA on CPAP, reverse R shoulder arthroplastic 04/2022, L total hip replacement 2020.   Per neurologist on 04/11/23: He was evaluated for gait impairment. He has a history of left hip replacement and left shoulder repair. He had EMG in 2022 with a chronic L5 radiculopathy on right lower extremity. MRI brain showed L frontal infarct and other smaller infarcts. MRI C and T spine was unremarkable. EMG 09/2021 then was normal. He saw Dr. Festus Hubert at [facility] who wondered about PD. DAT scan 08/2022 was positive. He started Sinemet  but did not noticed improvements.  Today he reports that he has had walking and balance problems since 2022. This started in the spring 2022. He wonders if the COVID booster which preceded his symptoms could be to blame. He underwent the workup as described above and it was thought that his gait impairments  were related to spinal disease. However his gait has continued to decline. While he can get around the house he has to use a cane sometimes and has come today in a wheelchair given the longer distance. He often limps and has to hold and grab his leg to pull it across to ambulate. He has had a few falls in the past year. He loses his balance if he is not careful and watching how he steps. He feels like his right leg is weaker. Turns are very challenging and when trying to multitask such as carrying something while walking, he will be at higher risk of falling. He admits when I describe freezing of gait that this is what he experiences especially with his right leg. He denies festinating gait. He has never completed PT for these problems since onset of symptoms.   PAIN:  Are you having pain? No   PRECAUTIONS: Fall  FALLS: Has patient fallen in last 6 months? Yes. Number of falls 0-1  LIVING ENVIRONMENT: Lives with: lives alone, has family that lives close by, a bunch of kids and grandkids  Lives in: House/apartment Stairs: No, reports stairs are a little easier with picking up feet  Has following equipment at home: Single point cane and Has a walker with no wheels   PLOF: Independent, Vocation/Vocational requirements: Works part time as a Clinical research associate , and Leisure: likes to smoke barbecue   PATIENT GOALS: Wants to improve his walking   OBJECTIVE:  Note: Objective measures were completed at Evaluation unless otherwise noted.  DIAGNOSTIC FINDINGS: DAT SCAN 08/2022: IMPRESSION: Moderate decreased relative radiotracer activity posterior LEFT putamen. Equivocal exam could indicate early Parkinsonian syndrome pathology.  MRI brain 04/29/21: IMPRESSION: Mildly motion degraded exam.   No evidence of acute intracranial abnormality.   Small chronic cortically-based infarct within the medial right parietal lobe.   Chronic lacunar infarct within the posterior left frontal lobe white matter. Minimal  background cerebral white matter chronic small vessel ischemic disease.   Mild-to-moderate generalized cerebral and cerebellar atrophy.  COGNITION: Overall cognitive status: Within functional limits for tasks assessed and Pt denies changes in thinking or memory.    SENSATION: WFL for light touch at BLE  COORDINATION: Heel to shin: Bradykinetic and harder to perform with RLE    MUSCLE TONE: Mild rigidity RLE     POSTURE: rounded shoulders, forward flexed posture  in standing with gait   LOWER EXTREMITY ROM:    Pt with bilateral knee flexion in standing    LOWER EXTREMITY MMT:    MMT Right Eval Left Eval  Hip flexion 4- 4  Hip extension    Hip abduction    Hip adduction    Hip internal rotation    Hip external rotation    Knee flexion 4+ 4+  Knee extension 4+ 5  Ankle dorsiflexion 3- 4  Ankle plantarflexion    Ankle inversion    Ankle eversion    (Blank rows = not tested)  BED MOBILITY:  Pt reports difficulty sometimes getting up due to R shoulder (hx of R shoulder surgery at end of 2023)  TRANSFERS: Assistive device utilized: None  Sit to stand: SBA and CGA Stand to sit: SBA and CGA  Performs with no UE support, decr anterior weight shift, pt with knees flexed in standing   Pt with initiation freezing in standing, before pt goes to walk.    FUNCTIONAL TESTS:  5 times sit to stand: 16.6 seconds with no UE support, bracing with BLE on chair  Timed up and go (TUG): 21.22 seconds with min A when turning and for freezing episodes during turns and straight away   10 meter walk test: 22.44 seconds = 1.46 ft/sec with min A and no AD  PATIENT SURVEYS:  Freezing of gait questionnaire 12/24 (higher score denotes more severe freezing of gait)                                                                                                                              TREATMENT DATE:   GAIT: Gait pattern: decreased arm swing- Right, decreased arm swing- Left,  decreased step length- Right, decreased step length- Left, decreased stride length, decreased ankle dorsiflexion- Right, decreased ankle dorsiflexion- Left, Right foot flat, Left foot flat, shuffling, decreased trunk rotation, trunk flexed, wide BOS, and poor foot clearance- Right Distance walked: Clinic distances  Assistive device utilized: RW and none Level of assistance: CGA and Min A Comments: Pt ambulating into clinic with no AD and min-mod A for balance, pt with wide BOS, shuffled steps and freezing/festinating episodes. Pt relying on furniture walking to stabilize, no adherence to freezing strategies from previous session noted. Mod verbal cues to maintain close distance to RW throughout session, especially w/turns. Following Scifit, pt w/reduce incidence of freezing, but continued to fight the freeze and leave RLE posteriorly to body. By end of session,  pt able to ambulate out of clinic without AD w/CGA and utilized freezing strategies well. No furniture walking required.    Ther Ex  SciFit multi-peaks level 5 for 8 minutes using BUE/BLEs for neural priming for reciprocal movement, dynamic cardiovascular warmup and increased amplitude of stepping. Pt required mod A to fully turn to sit on seat, as he placed RW out of way and reached for seat and fell into seat, requiring mod A to  prevent slipping off seat. RPE of 7/10 following activity   In // bars for improved lateral weight shifting, BLE strength and review of freezing strategies:  On rocker board in L/R direction:  Attempted to stand w/o UE support and CGA, but pt immediately losing balance to R side. Lengthy discussion regarding pt's R lateral lean and weightbearing preference and importance on working on shifting weight to L side. Began to work on lateral weight shifts on board w/BUE support and progressing to no UE support. Increased difficulty shifting weight to L side, but decreased control when shifting weight to R. However,  w/practice, pt able to improve performance, requiring SBA w/o UE support to perform weight shift on board.  Educated pt on importance of fully turning prior to sitting on seated surface, which pt demonstrated well at end of session     PATIENT EDUCATION: Education details: Continued education on freezing strategies, importance of lateral weight shifting   Person educated: Patient Education method: Explanation, Demonstration, and Verbal cues Education comprehension: verbalized understanding, returned demonstration, and needs further education  HOME EXERCISE PROGRAM: Will provide at future session.   GOALS: Goals reviewed with patient? Yes  SHORT TERM GOALS: ALL STGS = LTGS  LONG TERM GOALS: Target date: 07/05/2023  Pt will be independent with final HEP for gait/balance in order to build upon functional gains made in therapy. Baseline:  Goal status: INITIAL  2.  Pt will verbalize/demo understanding of techniques to help with freezing episodes.  Baseline:  Goal status: INITIAL  3.  Pt will decr FOGQ to 9/24 or less in order to demo improved freezing.  Baseline: 12/24  Goal status: INITIAL  4.  Pt will improve TUG time to 17 seconds or less with LRAD vs. No AD with CGA in order to demo decrease fall risk.  Baseline: 21.22 seconds with min A  Goal status: INITIAL  5.  Pt will improve gait speed with LRAD to at least 1.8 ft/sec in order to demo improved community mobility.   Baseline: 22.44 seconds = 1.46 ft/sec with min A and no AD  Goal status: INITIAL  6.  Pt will improve 5x sit<>stand to less than or equal to 14 sec to demonstrate improved functional strength and transfer efficiency.   Baseline: 16.6 seconds with no UE support, bracing with BLE on chair  Goal status: INITIAL   ASSESSMENT:  CLINICAL IMPRESSION: Emphasis of skilled PT session on reciprocal coordination, lateral weight shifting and proper AD management. Pt required up to mod A when ambulating into  session due to significant freezing, poor freezing strategies and reliance on furniture walking to stabilize. By end of session, pt able to ambulate w/o AD and CGA w/improved adherence to freezing techniques (stop and reset). Pt demonstrates right lateral lean preference and had significant difficulty facilitating lateral weight shift on rockerboard, but did improve w/practice and cues to offload RLE. Will continue per POC.    OBJECTIVE IMPAIRMENTS: Abnormal gait, decreased activity tolerance, decreased balance, decreased coordination, decreased knowledge of use of DME, decreased mobility, difficulty walking, decreased ROM, decreased strength, impaired flexibility, and postural dysfunction.   ACTIVITY LIMITATIONS: standing, squatting, transfers, bed mobility, and locomotion level  PARTICIPATION LIMITATIONS: community activity  PERSONAL FACTORS: Age, Behavior pattern, Past/current experiences, Time since onset of injury/illness/exacerbation, and 3+ comorbidities: bipolar disorder (1997), HLD, HTN, OSA on CPAP, reverse R shoulder arthroplastic 04/2022, L total hip replacement 2020.   are also affecting patient's functional outcome.   REHAB POTENTIAL: Good  CLINICAL DECISION MAKING:  Evolving/moderate complexity  EVALUATION COMPLEXITY: Moderate  PLAN:  PT FREQUENCY: 2x/week  PT DURATION: 8 weeks  PLANNED INTERVENTIONS: 97164- PT Re-evaluation, 97110-Therapeutic exercises, 97530- Therapeutic activity, 97112- Neuromuscular re-education, 97535- Self Care, [id number]- Manual therapy, [phone]- Gait training, Patient/Family education, Balance training, Stair training, and DME instructions  PLAN FOR NEXT SESSION: Lateral weight shifting, turns. Work on freezing strategies. Work on gait with RW. Try SciFit at start of session.  Initiate HEP for weight shifting/balance as able.    Gisell Buehrle E Sala Tague, PT ,DPT 06/14/2023, 2:06 PM

## 2023-06-16 ENCOUNTER — Ambulatory Visit: Payer: PPO | Admitting: Physical Therapy

## 2023-06-20 ENCOUNTER — Telehealth: Payer: Self-pay | Admitting: Physical Therapy

## 2023-06-20 ENCOUNTER — Ambulatory Visit: Payer: PPO | Admitting: Physical Therapy

## 2023-06-20 NOTE — Telephone Encounter (Signed)
Called pt and left VM regarding no-show to PT session. Therapist mistakenly told pt that his next appointment was 1/22 instead of 1/21 during last PT session, so rescheduled pt for tomorrow.   Jill Alexanders Baleria Wyman, PT, DPT

## 2023-06-21 ENCOUNTER — Ambulatory Visit: Payer: PPO | Admitting: Physical Therapy

## 2023-06-21 DIAGNOSIS — M6281 Muscle weakness (generalized): Secondary | ICD-10-CM

## 2023-06-21 DIAGNOSIS — R2681 Unsteadiness on feet: Secondary | ICD-10-CM

## 2023-06-21 DIAGNOSIS — R2689 Other abnormalities of gait and mobility: Secondary | ICD-10-CM

## 2023-06-21 NOTE — Therapy (Signed)
OUTPATIENT PHYSICAL THERAPY NEURO TREATMENT   Patient Name: Timothy [name] MRN: [id number] DOB:[birth year], 73 y.o., male Today's Date: 06/21/2023   PCP: [name], MD   REFERRING PROVIDER: Kateri Mc, MD  END OF SESSION:  PT End of Session - 06/21/23 1234     Visit Number 4    Number of Visits 9    Date for PT Re-Evaluation 08/06/23    Authorization Type [employer]    PT Start Time 1233    PT Stop Time 1312    PT Time Calculation (min) 39 min    Equipment Utilized During Treatment Gait belt    Activity Tolerance Patient tolerated treatment well    Behavior During Therapy WFL for tasks assessed/performed             Past Medical History:  Diagnosis Date   Arthritis    Bipolar disorder (HCC)    controlled by meds since 1997   Cancer (HCC)    basal cell and pre melanoma on back   Neuromuscular disorder (HCC)    gait and ambulation problems, but no diagnosis from Neurology   Pneumonia    Sleep apnea    cpap   Wears glasses    reading   Past Surgical History:  Procedure Laterality Date   APPENDECTOMY     COLONOSCOPY     KNEE ARTHROSCOPY W/ MENISCAL REPAIR  2004   left   ORIF HUMERUS FRACTURE Left 09/19/2013   Procedure: LEFT OPEN REDUCTION INTERNAL FIXATION (ORIF) PROXIMAL HUMERUS; ROTATOR CUFF REPAIR;  Surgeon: Loreta Ave, MD;  Location: [facility];  Service: Orthopedics;  Laterality: Left;   REVERSE SHOULDER ARTHROPLASTY Right 05/27/2022   Procedure: REVERSE SHOULDER ARTHROPLASTY;  Surgeon: Yolonda Kida, MD;  Location: [facility];  Service: Orthopedics;  Laterality: Right;  120   TONSILLECTOMY     TOTAL HIP ARTHROPLASTY Left 07/18/2018   Procedure: TOTAL HIP ARTHROPLASTY ANTERIOR APPROACH;  Surgeon: Samson Frederic, MD;  Location: [facility];  Service: Orthopedics;  Laterality: Left;   Patient Active Problem List   Diagnosis Date Noted   S/P reverse total shoulder arthroplasty, right 05/27/2022    Degenerative scoliosis in adult patient 03/06/2020   Bipolar I disorder (HCC) 12/03/2019   OSA on CPAP 11/22/2018   S/P total hip arthroplasty 07/19/2018   History of adenomatous polyp of colon 11/09/2017   Depression 07/21/2016   HTN (hypertension) 03/14/2016   Vitamin D deficiency 03/14/2016   Abnormal glucose 03/14/2016    ONSET DATE: 05/05/2023  REFERRING DIAG: R26.9 (ICD-10-CM) - Unspecified abnormalities of gait and mobility  THERAPY DIAG:  Unsteadiness on feet  Other abnormalities of gait and mobility  Muscle weakness (generalized)  Rationale for Evaluation and Treatment: Rehabilitation  SUBJECTIVE:  SUBJECTIVE STATEMENT: Pt presents w/RW. Reports he is not using RW at home, just out in community. Denies falls or pain.   Pt accompanied by: self  PERTINENT HISTORY: PMH: bipolar disorder (1997), HLD, HTN, OSA on CPAP, reverse R shoulder arthroplastic 04/2022, L total hip replacement 2020.   Per neurologist on 04/11/23: He was evaluated for gait impairment. He has a history of left hip replacement and left shoulder repair. He had EMG in 2022 with a chronic L5 radiculopathy on right lower extremity. MRI brain showed L frontal infarct and other smaller infarcts. MRI C and T spine was unremarkable. EMG 09/2021 then was normal. He saw Dr. Everlena Cooper at [facility] who wondered about PD. DAT scan 08/2022 was positive. He started Sinemet but did not noticed improvements.  Today he reports that he has had walking and balance problems since 2022. This started in the spring 2022. He wonders if the COVID booster which preceded his symptoms could be to blame. He underwent the workup as described above and it was thought that his gait impairments were related to spinal disease. However his gait has continued to  decline. While he can get around the house he has to use a cane sometimes and has come today in a wheelchair given the longer distance. He often limps and has to hold and grab his leg to pull it across to ambulate. He has had a few falls in the past year. He loses his balance if he is not careful and watching how he steps. He feels like his right leg is weaker. Turns are very challenging and when trying to multitask such as carrying something while walking, he will be at higher risk of falling. He admits when I describe freezing of gait that this is what he experiences especially with his right leg. He denies festinating gait. He has never completed PT for these problems since onset of symptoms.   PAIN:  Are you having pain? No   PRECAUTIONS: Fall  FALLS: Has patient fallen in last 6 months? Yes. Number of falls 0-1  LIVING ENVIRONMENT: Lives with: lives alone, has family that lives close by, a bunch of kids and grandkids  Lives in: House/apartment Stairs: No, reports stairs are a little easier with picking up feet  Has following equipment at home: Single point cane and Has a walker with no wheels   PLOF: Independent, Vocation/Vocational requirements: Works part time as a Clinical research associate , and Leisure: likes to smoke barbecue   PATIENT GOALS: Wants to improve his walking   OBJECTIVE:  Note: Objective measures were completed at Evaluation unless otherwise noted.  DIAGNOSTIC FINDINGS: DAT SCAN 08/2022: IMPRESSION: Moderate decreased relative radiotracer activity posterior LEFT putamen. Equivocal exam could indicate early Parkinsonian syndrome pathology.  MRI brain 04/29/21: IMPRESSION: Mildly motion degraded exam.   No evidence of acute intracranial abnormality.   Small chronic cortically-based infarct within the medial right parietal lobe.   Chronic lacunar infarct within the posterior left frontal lobe white matter. Minimal background cerebral white matter chronic small vessel ischemic  disease.   Mild-to-moderate generalized cerebral and cerebellar atrophy.  COGNITION: Overall cognitive status: Within functional limits for tasks assessed and Pt denies changes in thinking or memory.    SENSATION: WFL for light touch at BLE  COORDINATION: Heel to shin: Bradykinetic and harder to perform with RLE    MUSCLE TONE: Mild rigidity RLE     POSTURE: rounded shoulders, forward flexed posture in standing with gait   LOWER EXTREMITY ROM:  Pt with bilateral knee flexion in standing    LOWER EXTREMITY MMT:    MMT Right Eval Left Eval  Hip flexion 4- 4  Hip extension    Hip abduction    Hip adduction    Hip internal rotation    Hip external rotation    Knee flexion 4+ 4+  Knee extension 4+ 5  Ankle dorsiflexion 3- 4  Ankle plantarflexion    Ankle inversion    Ankle eversion    (Blank rows = not tested)  BED MOBILITY:  Pt reports difficulty sometimes getting up due to R shoulder (hx of R shoulder surgery at end of 2023)  TRANSFERS: Assistive device utilized: None  Sit to stand: SBA and CGA Stand to sit: SBA and CGA  Performs with no UE support, decr anterior weight shift, pt with knees flexed in standing   Pt with initiation freezing in standing, before pt goes to walk.    FUNCTIONAL TESTS:  5 times sit to stand: 16.6 seconds with no UE support, bracing with BLE on chair  Timed up and go (TUG): 21.22 seconds with min A when turning and for freezing episodes during turns and straight away   10 meter walk test: 22.44 seconds = 1.46 ft/sec with min A and no AD  PATIENT SURVEYS:  Freezing of gait questionnaire 12/24 (higher score denotes more severe freezing of gait)                                                                                                                              TREATMENT DATE:   GAIT: Gait pattern: decreased arm swing- Right, decreased arm swing- Left, decreased step length- Right, decreased step length- Left, decreased  stride length, decreased ankle dorsiflexion- Right, decreased ankle dorsiflexion- Left, Right foot flat, Left foot flat, shuffling, decreased trunk rotation, trunk flexed, wide BOS, and poor foot clearance- Right Distance walked: Clinic distances  Assistive device utilized: RW and none Level of assistance: CGA and Min A Comments: Pt ambulating into session w/RW w/significant freezing noted (especially through doorways and w/turns). Pt requires mod cues to maintain close distance to RW and to fully turn prior to sitting.    Ther Ex  SciFit multi-peaks level 8.5 for 8 minutes using BUE/BLEs for neural priming for reciprocal movement, dynamic cardiovascular warmup and increased amplitude of stepping. Pt required mod A to fully turn to sit on seat, as he placed RW out of way and reached for seat and fell into seat.  NMR  In // bars for improved lateral weight shifting, step clearance, single leg stability and postural control:  Alt fwd advance/retreat over 4" foam beam, x15 reps per side w/intermittent UE support and CGA. Pt initially moving very quickly to "get it over with", so mod cues to slow down and focus on step clearance and weight shift. Pt frequently knocking beam over w/RLE.  Lateral adv/retreat over 4" beam, x15 reps per side. Pt more challenged  w/lateral direction compared to fwd direction but again needed cues to slow down. Mod cues for full hip/knee flexion on R side, as pt knocking beam over w/RLE each rep. BUE support throughout  On rockerboard in L/R direction, cued pt to keep board in midline, but pt unable to. Pt has significant R lateral lean preference w/poor hip strategy noted. Cued pt to use hips to shift weight to L side, as pt attempting to flex knees to perform shift instead. Pt reported being very fatigued w/activity due to BLE weakness.   At counter top for improved lateral weight shifting, step clearance and truncal rotation: Alt side step over 4" hurdle w/contralateral OH  reach to post-it placed on cabinets, x10 reps per side. Pt required min mutlimodal cues to maintain reciprocal sequence throughout and relied on finger-walking w/R hand to reach post-it w/RUE. Pt did improve shoulder mobility and wing span w/repetition, but frequently knocked hurdles over (RLE>LLE). CGA throughout. Did not add to HEP as this was difficult for pt to sequence.  Added quarter turns to HEP (see bolded below) as this is a major trigger for pt's freezing. Pt able to perform well at counter top w/good BOS and step clearance noted. Increased difficulty w/carryover when turning in clinic.    PATIENT EDUCATION: Education details: Initial HEP, importance of weight shifting for freezing  Person educated: Patient Education method: Explanation, Demonstration, Verbal cues, and Handouts Education comprehension: verbalized understanding, returned demonstration, verbal cues required, and needs further education  HOME EXERCISE PROGRAM: Access Code: [email or web address] URL: [email or web address] Date: 06/21/2023 Prepared by: Alethia Berthold Nastasha Reising  Exercises - Standing Quarter Turn with Counter Support  - 1 x daily - 7 x weekly - 3 sets - 10 reps  GOALS: Goals reviewed with patient? Yes  SHORT TERM GOALS: ALL STGS = LTGS  LONG TERM GOALS: Target date: 07/05/2023  Pt will be independent with final HEP for gait/balance in order to build upon functional gains made in therapy. Baseline:  Goal status: INITIAL  2.  Pt will verbalize/demo understanding of techniques to help with freezing episodes.  Baseline:  Goal status: INITIAL  3.  Pt will decr FOGQ to 9/24 or less in order to demo improved freezing.  Baseline: 12/24  Goal status: INITIAL  4.  Pt will improve TUG time to 17 seconds or less with LRAD vs. No AD with CGA in order to demo decrease fall risk.  Baseline: 21.22 seconds with min A  Goal status: INITIAL  5.  Pt will improve gait speed with LRAD to at least 1.8 ft/sec in order to  demo improved community mobility.   Baseline: 22.44 seconds = 1.46 ft/sec with min A and no AD  Goal status: INITIAL  6.  Pt will improve 5x sit<>stand to less than or equal to 14 sec to demonstrate improved functional strength and transfer efficiency.   Baseline: 16.6 seconds with no UE support, bracing with BLE on chair  Goal status: INITIAL   ASSESSMENT:  CLINICAL IMPRESSION: Emphasis of skilled PT session on establishing initial HEP, lateral weight shifting, increased step clearance and postural control. Pt brought RW to session but continues to require min cues to stay inside walker at all times and for proper foot placement w/turns. Pt very challenged by rockerboard tasks due to poor hip strategy and BLE weakness. Pt has strong R lateral weight shift preference, frequently leaving R foot posteriorly w/freezing episodes and dragging it forward. Added quarter turns to HEP as this is a major trigger for pt's  freezing and he frequently falls into chairs rather than fully turning to sit. Will continue per POC.    OBJECTIVE IMPAIRMENTS: Abnormal gait, decreased activity tolerance, decreased balance, decreased coordination, decreased knowledge of use of DME, decreased mobility, difficulty walking, decreased ROM, decreased strength, impaired flexibility, and postural dysfunction.   ACTIVITY LIMITATIONS: standing, squatting, transfers, bed mobility, and locomotion level  PARTICIPATION LIMITATIONS: community activity  PERSONAL FACTORS: Age, Behavior pattern, Past/current experiences, Time since onset of injury/illness/exacerbation, and 3+ comorbidities: bipolar disorder (1997), HLD, HTN, OSA on CPAP, reverse R shoulder arthroplastic 04/2022, L total hip replacement 2020.   are also affecting patient's functional outcome.   REHAB POTENTIAL: Good  CLINICAL DECISION MAKING: Evolving/moderate complexity  EVALUATION COMPLEXITY: Moderate  PLAN:  PT FREQUENCY: 2x/week  PT DURATION: 8  weeks  PLANNED INTERVENTIONS: 97164- PT Re-evaluation, 97110-Therapeutic exercises, 97530- Therapeutic activity, 97112- Neuromuscular re-education, 97535- Self Care, [id number]- Manual therapy, [phone]- Gait training, Patient/Family education, Balance training, Stair training, and DME instructions  PLAN FOR NEXT SESSION: Lateral weight shifting, turns. Work on freezing strategies. Work on gait with RW. Try SciFit at start of session.  Add to HEP for weight shifting/balance as able. Rockerboard tasks, increased step clearance (RLE>LLE)   Jill Alexanders Holli Rengel, PT ,DPT 06/21/2023, 1:21 PM

## 2023-06-22 ENCOUNTER — Ambulatory Visit: Payer: PPO | Admitting: Physical Therapy

## 2023-06-22 ENCOUNTER — Encounter: Payer: Self-pay | Admitting: Physical Therapy

## 2023-06-22 DIAGNOSIS — R2689 Other abnormalities of gait and mobility: Secondary | ICD-10-CM

## 2023-06-22 DIAGNOSIS — R2681 Unsteadiness on feet: Secondary | ICD-10-CM

## 2023-06-22 DIAGNOSIS — R29818 Other symptoms and signs involving the nervous system: Secondary | ICD-10-CM

## 2023-06-22 DIAGNOSIS — M6281 Muscle weakness (generalized): Secondary | ICD-10-CM

## 2023-06-22 NOTE — Therapy (Signed)
OUTPATIENT PHYSICAL THERAPY NEURO TREATMENT   Patient Name: Timothy [name] MRN: [id number] DOB:04-03-1951, 73 y.o., male Today's Date: 06/22/2023   PCP: [name], MD   REFERRING PROVIDER: Kateri Mc, MD  END OF SESSION:  PT End of Session - 06/22/23 1105     Visit Number 5    Number of Visits 9    Date for PT Re-Evaluation 08/06/23    Authorization Type [employer]    PT Start Time 1103    PT Stop Time 1143    PT Time Calculation (min) 40 min    Equipment Utilized During Treatment Gait belt    Activity Tolerance Patient tolerated treatment well    Behavior During Therapy WFL for tasks assessed/performed             Past Medical History:  Diagnosis Date   Arthritis    Bipolar disorder (HCC)    controlled by meds since 1997   Cancer (HCC)    basal cell and pre melanoma on back   Neuromuscular disorder (HCC)    gait and ambulation problems, but no diagnosis from Neurology   Pneumonia    Sleep apnea    cpap   Wears glasses    reading   Past Surgical History:  Procedure Laterality Date   APPENDECTOMY     COLONOSCOPY     KNEE ARTHROSCOPY W/ MENISCAL REPAIR  2004   left   ORIF HUMERUS FRACTURE Left 09/19/2013   Procedure: LEFT OPEN REDUCTION INTERNAL FIXATION (ORIF) PROXIMAL HUMERUS; ROTATOR CUFF REPAIR;  Surgeon: Loreta Ave, MD;  [facility];  Service: Orthopedics;  Laterality: Left;   REVERSE SHOULDER ARTHROPLASTY Right 05/27/2022   Procedure: REVERSE SHOULDER ARTHROPLASTY;  Surgeon: Yolonda Kida, MD;  Location: [facility];  Service: Orthopedics;  Laterality: Right;  120   TONSILLECTOMY     TOTAL HIP ARTHROPLASTY Left 07/18/2018   Procedure: TOTAL HIP ARTHROPLASTY ANTERIOR APPROACH;  Surgeon: Samson Frederic, MD;  Location: [facility];  Service: Orthopedics;  Laterality: Left;   Patient Active Problem List   Diagnosis Date Noted   S/P reverse total shoulder arthroplasty, right 05/27/2022    Degenerative scoliosis in adult patient 03/06/2020   Bipolar I disorder (HCC) 12/03/2019   OSA on CPAP 11/22/2018   S/P total hip arthroplasty 07/19/2018   History of adenomatous polyp of colon 11/09/2017   Depression 07/21/2016   HTN (hypertension) 03/14/2016   Vitamin D deficiency 03/14/2016   Abnormal glucose 03/14/2016    ONSET DATE: 05/05/2023  REFERRING DIAG: R26.9 (ICD-10-CM) - Unspecified abnormalities of gait and mobility  THERAPY DIAG:  Unsteadiness on feet  Other abnormalities of gait and mobility  Muscle weakness (generalized)  Other symptoms and signs involving the nervous system  Rationale for Evaluation and Treatment: Rehabilitation  SUBJECTIVE:  SUBJECTIVE STATEMENT: No falls. Brought in his RW again today, just mainly uses it when coming to therapy. Does not use an AD at home. Has tried the turning exercise and reports that by doing that makes it easier to turn.   Pt accompanied by: self  PERTINENT HISTORY: PMH: bipolar disorder (1997), HLD, HTN, OSA on CPAP, reverse R shoulder arthroplastic 04/2022, L total hip replacement 2020.   Per neurologist on 04/11/23: He was evaluated for gait impairment. He has a history of left hip replacement and left shoulder repair. He had EMG in 2022 with a chronic L5 radiculopathy on right lower extremity. MRI brain showed L frontal infarct and other smaller infarcts. MRI C and T spine was unremarkable. EMG 09/2021 then was normal. He saw Dr. Everlena Cooper at [facility] who wondered about PD. DAT scan 08/2022 was positive. He started Sinemet but did not noticed improvements.  Today he reports that he has had walking and balance problems since 2022. This started in the spring 2022. He wonders if the COVID booster which preceded his symptoms could be to  blame. He underwent the workup as described above and it was thought that his gait impairments were related to spinal disease. However his gait has continued to decline. While he can get around the house he has to use a cane sometimes and has come today in a wheelchair given the longer distance. He often limps and has to hold and grab his leg to pull it across to ambulate. He has had a few falls in the past year. He loses his balance if he is not careful and watching how he steps. He feels like his right leg is weaker. Turns are very challenging and when trying to multitask such as carrying something while walking, he will be at higher risk of falling. He admits when I describe freezing of gait that this is what he experiences especially with his right leg. He denies festinating gait. He has never completed PT for these problems since onset of symptoms.   PAIN:  Are you having pain? No   PRECAUTIONS: Fall  FALLS: Has patient fallen in last 6 months? Yes. Number of falls 0-1  LIVING ENVIRONMENT: Lives with: lives alone, has family that lives close by, a bunch of kids and grandkids  Lives in: House/apartment Stairs: No, reports stairs are a little easier with picking up feet  Has following equipment at home: Single point cane and Has a walker with no wheels   PLOF: Independent, Vocation/Vocational requirements: Works part time as a Clinical research associate , and Leisure: likes to smoke barbecue   PATIENT GOALS: Wants to improve his walking   OBJECTIVE:  Note: Objective measures were completed at Evaluation unless otherwise noted.  DIAGNOSTIC FINDINGS: DAT SCAN 08/2022: IMPRESSION: Moderate decreased relative radiotracer activity posterior LEFT putamen. Equivocal exam could indicate early Parkinsonian syndrome pathology.  MRI brain 04/29/21: IMPRESSION: Mildly motion degraded exam.   No evidence of acute intracranial abnormality.   Small chronic cortically-based infarct within the medial right parietal  lobe.   Chronic lacunar infarct within the posterior left frontal lobe white matter. Minimal background cerebral white matter chronic small vessel ischemic disease.   Mild-to-moderate generalized cerebral and cerebellar atrophy.  COGNITION: Overall cognitive status: Within functional limits for tasks assessed and Pt denies changes in thinking or memory.    SENSATION: WFL for light touch at BLE  COORDINATION: Heel to shin: Bradykinetic and harder to perform with RLE    MUSCLE TONE: Mild rigidity RLE  POSTURE: rounded shoulders, forward flexed posture in standing with gait   LOWER EXTREMITY ROM:    Pt with bilateral knee flexion in standing    LOWER EXTREMITY MMT:    MMT Right Eval Left Eval  Hip flexion 4- 4  Hip extension    Hip abduction    Hip adduction    Hip internal rotation    Hip external rotation    Knee flexion 4+ 4+  Knee extension 4+ 5  Ankle dorsiflexion 3- 4  Ankle plantarflexion    Ankle inversion    Ankle eversion    (Blank rows = not tested)  BED MOBILITY:  Pt reports difficulty sometimes getting up due to R shoulder (hx of R shoulder surgery at end of 2023)  TRANSFERS: Assistive device utilized: None  Sit to stand: SBA and CGA Stand to sit: SBA and CGA  Performs with no UE support, decr anterior weight shift, pt with knees flexed in standing   Pt with initiation freezing in standing, before pt goes to walk.    FUNCTIONAL TESTS:  5 times sit to stand: 16.6 seconds with no UE support, bracing with BLE on chair  Timed up and go (TUG): 21.22 seconds with min A when turning and for freezing episodes during turns and straight away   10 meter walk test: 22.44 seconds = 1.46 ft/sec with min A and no AD  PATIENT SURVEYS:  Freezing of gait questionnaire 12/24 (higher score denotes more severe freezing of gait)                                                                                                                               TREATMENT DATE:   GAIT: Gait pattern: decreased arm swing- Right, decreased arm swing- Left, decreased step length- Right, decreased step length- Left, decreased stride length, decreased ankle dorsiflexion- Right, decreased ankle dorsiflexion- Left, Right foot flat, Left foot flat, shuffling, decreased trunk rotation, trunk flexed, wide BOS, and poor foot clearance- Right Distance walked: Clinic distances  Assistive device utilized: RW  Level of assistance: CGA  Comments: Pt ambulating into session w/ RW, pt needing cues to stay inside walker and picking up RLE during gait and focusing on foot clearance/heel strike. Pt with episodes of stutter steps, but no significant freezing. Continued cues for foot clearance and stride length, and staying inside RW during remainder of session between activities   Pt needs cues for marching/foot clearance when turing and turning all the way with RW before sitting down on SciFit seat or mat table.   Adjusted RW to proper height as it was initially too tall for pt.    Ther Ex  SciFit multi-peaks level 6.0  for 8 minutes using BUE/BLEs for neural priming for reciprocal movement, dynamic cardiovascular warmup and increased amplitude of stepping. Pt did better with turning fully with RW before sitting down on the seat. Cues for foot clearance/marching when turning. Pt able to keep spm >  80, pt reporting RPE as 5/10   NMR  In // bars with 6 blaze pods in a semi-circle for SLS, weight shifting, foot clearance, performed on random hit setting. Trying to perform without UE support, but pt needing intermittent UE support for balance. Cues to stomp onto blaze pod as pt sometimes not hitting it hard enough.  Performed 3 bouts of minute: 21 hits, 24 hits, 19 hits  At bottom of stair case: Alternating SLS taps to 6" step for foot clearance/SLS stability 10 reps each side, then to 2nd step 10 reps each side, pt more challenged with lifting RLE at times to touch 2nd  step At countertop: With wide BOS, standing lateral weight shifting (initially tried with use of reaching, but pt reporting that this did not feel good with his shoulder, so discontinued UE movement), working on lifting up heel from the ground and trying to perform without UE support, performed 12 reps each side, progressed to using BUE support on countertop and weight shifting and lifting up contralateral leg for dynamic SLS 10 reps each side Alternating lateral step and weight shift and then big step back to the middle with focus on foot clearance, performing with BUE support, 12 reps each side, pt more challenged with foot clearance with RLE. Cued to weight shift onto leg when stepping before stepping back to midline. Added above exercises to HEP for pt to perform at countertop for weight shifting/foot clearance    PATIENT EDUCATION: Education details: Importance of weight shifting for freezing, additions to HEP for balance  Person educated: Patient Education method: Explanation, Demonstration, Verbal cues, and Handouts Education comprehension: verbalized understanding, returned demonstration, verbal cues required, and needs further education  HOME EXERCISE PROGRAM: Access Code: [email or web address] URL: [email or web address] Date: 06/22/2023 Prepared by: Sherlie Ban  Exercises - Standing Quarter Turn with Counter Support  - 1 x daily - 7 x weekly - 3 sets - 10 reps - Side to Side Weight Shift with Counter Support  - 1-2 x daily - 7 x weekly - 1-2 sets - 10 reps - Side Stepping with Counter Support  - 1-2 x daily - 7 x weekly - 1-2 sets - 10 reps  GOALS: Goals reviewed with patient? Yes  SHORT TERM GOALS: ALL STGS = LTGS  LONG TERM GOALS: Target date: 07/05/2023  Pt will be independent with final HEP for gait/balance in order to build upon functional gains made in therapy. Baseline:  Goal status: INITIAL  2.  Pt will verbalize/demo understanding of techniques to help with  freezing episodes.  Baseline:  Goal status: INITIAL  3.  Pt will decr FOGQ to 9/24 or less in order to demo improved freezing.  Baseline: 12/24  Goal status: INITIAL  4.  Pt will improve TUG time to 17 seconds or less with LRAD vs. No AD with CGA in order to demo decrease fall risk.  Baseline: 21.22 seconds with min A  Goal status: INITIAL  5.  Pt will improve gait speed with LRAD to at least 1.8 ft/sec in order to demo improved community mobility.   Baseline: 22.44 seconds = 1.46 ft/sec with min A and no AD  Goal status: INITIAL  6.  Pt will improve 5x sit<>stand to less than or equal to 14 sec to demonstrate improved functional strength and transfer efficiency.   Baseline: 16.6 seconds with no UE support, bracing with BLE on chair  Goal status: INITIAL   ASSESSMENT:  CLINICAL IMPRESSION: Today's skilled session focused on use of  SciFit at start of session for neural priming/stepping intensity, standing balance tasks for weight shifting and SLS/foot clearance. Pt challenged by SLS/weight shifting blaze pod tasks on level ground when trying to perform without UE support. Added standing lateral weight shifting and lateral step and weight shift to HEP at countertop to work on foot clearance/weight shifting to help with gait and freezing strategies. Will continue per POC.    OBJECTIVE IMPAIRMENTS: Abnormal gait, decreased activity tolerance, decreased balance, decreased coordination, decreased knowledge of use of DME, decreased mobility, difficulty walking, decreased ROM, decreased strength, impaired flexibility, and postural dysfunction.   ACTIVITY LIMITATIONS: standing, squatting, transfers, bed mobility, and locomotion level  PARTICIPATION LIMITATIONS: community activity  PERSONAL FACTORS: Age, Behavior pattern, Past/current experiences, Time since onset of injury/illness/exacerbation, and 3+ comorbidities: bipolar disorder (1997), HLD, HTN, OSA on CPAP, reverse R shoulder  arthroplastic 04/2022, L total hip replacement 2020.   are also affecting patient's functional outcome.   REHAB POTENTIAL: Good  CLINICAL DECISION MAKING: Evolving/moderate complexity  EVALUATION COMPLEXITY: Moderate  PLAN:  PT FREQUENCY: 2x/week  PT DURATION: 8 weeks  PLANNED INTERVENTIONS: 97164- PT Re-evaluation, 97110-Therapeutic exercises, 97530- Therapeutic activity, 97112- Neuromuscular re-education, 97535- Self Care, [id number]- Manual therapy, [phone]- Gait training, Patient/Family education, Balance training, Stair training, and DME instructions  PLAN FOR NEXT SESSION: Lateral weight shifting, turns. Work on freezing strategies. Work on gait with RW. Try SciFit at start of session.  Add to HEP for weight shifting/balance as able. Rockerboard tasks, increased step clearance (RLE>LLE)   Drake Leach, PT ,DPT 06/22/2023, 12:19 PM

## 2023-06-26 ENCOUNTER — Ambulatory Visit: Payer: PPO | Admitting: Physical Therapy

## 2023-06-26 ENCOUNTER — Encounter: Payer: Self-pay | Admitting: Physical Therapy

## 2023-06-26 DIAGNOSIS — R29818 Other symptoms and signs involving the nervous system: Secondary | ICD-10-CM

## 2023-06-26 DIAGNOSIS — R2689 Other abnormalities of gait and mobility: Secondary | ICD-10-CM

## 2023-06-26 DIAGNOSIS — M6281 Muscle weakness (generalized): Secondary | ICD-10-CM

## 2023-06-26 DIAGNOSIS — R2681 Unsteadiness on feet: Secondary | ICD-10-CM

## 2023-06-26 NOTE — Therapy (Signed)
OUTPATIENT PHYSICAL THERAPY NEURO TREATMENT   Patient Name: Timothy [name] MRN: [id number] DOB:[birth year], 73 y.o., male Today's Date: 06/26/2023   PCP: [name], MD   REFERRING PROVIDER: Kateri Mc, MD  END OF SESSION:  PT End of Session - 06/26/23 1235     Visit Number 6    Number of Visits 9    Date for PT Re-Evaluation 08/06/23    Authorization Type [employer]    PT Start Time 1232    PT Stop Time 1313    PT Time Calculation (min) 41 min    Equipment Utilized During Treatment Gait belt    Activity Tolerance Patient tolerated treatment well    Behavior During Therapy WFL for tasks assessed/performed             Past Medical History:  Diagnosis Date   Arthritis    Bipolar disorder (HCC)    controlled by meds since 1997   Cancer (HCC)    basal cell and pre melanoma on back   Neuromuscular disorder (HCC)    gait and ambulation problems, but no diagnosis from Neurology   Pneumonia    Sleep apnea    cpap   Wears glasses    reading   Past Surgical History:  Procedure Laterality Date   APPENDECTOMY     COLONOSCOPY     KNEE ARTHROSCOPY W/ MENISCAL REPAIR  2004   left   ORIF HUMERUS FRACTURE Left 09/19/2013   Procedure: LEFT OPEN REDUCTION INTERNAL FIXATION (ORIF) PROXIMAL HUMERUS; ROTATOR CUFF REPAIR;  Surgeon: Loreta Ave, MD;  Location: [facility];  Service: Orthopedics;  Laterality: Left;   REVERSE SHOULDER ARTHROPLASTY Right 05/27/2022   Procedure: REVERSE SHOULDER ARTHROPLASTY;  Surgeon: Yolonda Kida, MD;  Location: [facility];  Service: Orthopedics;  Laterality: Right;  120   TONSILLECTOMY     TOTAL HIP ARTHROPLASTY Left 07/18/2018   Procedure: TOTAL HIP ARTHROPLASTY ANTERIOR APPROACH;  Surgeon: Samson Frederic, MD;  Location: [facility];  Service: Orthopedics;  Laterality: Left;   Patient Active Problem List   Diagnosis Date Noted   S/P reverse total shoulder arthroplasty, right 05/27/2022    Degenerative scoliosis in adult patient 03/06/2020   Bipolar I disorder (HCC) 12/03/2019   OSA on CPAP 11/22/2018   S/P total hip arthroplasty 07/19/2018   History of adenomatous polyp of colon 11/09/2017   Depression 07/21/2016   HTN (hypertension) 03/14/2016   Vitamin D deficiency 03/14/2016   Abnormal glucose 03/14/2016    ONSET DATE: 05/05/2023  REFERRING DIAG: R26.9 (ICD-10-CM) - Unspecified abnormalities of gait and mobility  THERAPY DIAG:  Unsteadiness on feet  Other abnormalities of gait and mobility  Muscle weakness (generalized)  Other symptoms and signs involving the nervous system  Rationale for Evaluation and Treatment: Rehabilitation  SUBJECTIVE:  SUBJECTIVE STATEMENT: Pt reporting picking up more of the Carbidopa Levodopa. Still has not noticed a difference. Notes exercises are going fine at home. No falls.   Pt accompanied by: self  PERTINENT HISTORY: PMH: bipolar disorder (1997), HLD, HTN, OSA on CPAP, reverse R shoulder arthroplastic 04/2022, L total hip replacement 2020.   Per neurologist on 04/11/23: He was evaluated for gait impairment. He has a history of left hip replacement and left shoulder repair. He had EMG in 2022 with a chronic L5 radiculopathy on right lower extremity. MRI brain showed L frontal infarct and other smaller infarcts. MRI C and T spine was unremarkable. EMG 09/2021 then was normal. He saw Dr. Everlena Cooper at [facility] who wondered about PD. DAT scan 08/2022 was positive. He started Sinemet but did not noticed improvements.  Today he reports that he has had walking and balance problems since 2022. This started in the spring 2022. He wonders if the COVID booster which preceded his symptoms could be to blame. He underwent the workup as described above and it was  thought that his gait impairments were related to spinal disease. However his gait has continued to decline. While he can get around the house he has to use a cane sometimes and has come today in a wheelchair given the longer distance. He often limps and has to hold and grab his leg to pull it across to ambulate. He has had a few falls in the past year. He loses his balance if he is not careful and watching how he steps. He feels like his right leg is weaker. Turns are very challenging and when trying to multitask such as carrying something while walking, he will be at higher risk of falling. He admits when I describe freezing of gait that this is what he experiences especially with his right leg. He denies festinating gait. He has never completed PT for these problems since onset of symptoms.   PAIN:  Are you having pain? No   PRECAUTIONS: Fall  FALLS: Has patient fallen in last 6 months? Yes. Number of falls 0-1  LIVING ENVIRONMENT: Lives with: lives alone, has family that lives close by, a bunch of kids and grandkids  Lives in: House/apartment Stairs: No, reports stairs are a little easier with picking up feet  Has following equipment at home: Single point cane and Has a walker with no wheels   PLOF: Independent, Vocation/Vocational requirements: Works part time as a Clinical research associate , and Leisure: likes to smoke barbecue   PATIENT GOALS: Wants to improve his walking   OBJECTIVE:  Note: Objective measures were completed at Evaluation unless otherwise noted.  DIAGNOSTIC FINDINGS: DAT SCAN 08/2022: IMPRESSION: Moderate decreased relative radiotracer activity posterior LEFT putamen. Equivocal exam could indicate early Parkinsonian syndrome pathology.  MRI brain 04/29/21: IMPRESSION: Mildly motion degraded exam.   No evidence of acute intracranial abnormality.   Small chronic cortically-based infarct within the medial right parietal lobe.   Chronic lacunar infarct within the posterior left  frontal lobe white matter. Minimal background cerebral white matter chronic small vessel ischemic disease.   Mild-to-moderate generalized cerebral and cerebellar atrophy.  COGNITION: Overall cognitive status: Within functional limits for tasks assessed and Pt denies changes in thinking or memory.    SENSATION: WFL for light touch at BLE  COORDINATION: Heel to shin: Bradykinetic and harder to perform with RLE    MUSCLE TONE: Mild rigidity RLE     POSTURE: rounded shoulders, forward flexed posture in standing with gait  LOWER EXTREMITY ROM:    Pt with bilateral knee flexion in standing    LOWER EXTREMITY MMT:    MMT Right Eval Left Eval  Hip flexion 4- 4  Hip extension    Hip abduction    Hip adduction    Hip internal rotation    Hip external rotation    Knee flexion 4+ 4+  Knee extension 4+ 5  Ankle dorsiflexion 3- 4  Ankle plantarflexion    Ankle inversion    Ankle eversion    (Blank rows = not tested)  BED MOBILITY:  Pt reports difficulty sometimes getting up due to R shoulder (hx of R shoulder surgery at end of 2023)  TRANSFERS: Assistive device utilized: None  Sit to stand: SBA and CGA Stand to sit: SBA and CGA  Performs with no UE support, decr anterior weight shift, pt with knees flexed in standing   Pt with initiation freezing in standing, before pt goes to walk.    FUNCTIONAL TESTS:  5 times sit to stand: 16.6 seconds with no UE support, bracing with BLE on chair  Timed up and go (TUG): 21.22 seconds with min A when turning and for freezing episodes during turns and straight away   10 meter walk test: 22.44 seconds = 1.46 ft/sec with min A and no AD  PATIENT SURVEYS:  Freezing of gait questionnaire 12/24 (higher score denotes more severe freezing of gait)                                                                                                                              TREATMENT DATE:   GAIT: Gait pattern: decreased arm swing-  Right, decreased arm swing- Left, decreased step length- Right, decreased step length- Left, decreased stride length, decreased ankle dorsiflexion- Right, decreased ankle dorsiflexion- Left, Right foot flat, Left foot flat, shuffling, decreased trunk rotation, trunk flexed, wide BOS, and poor foot clearance- Right Distance walked: Clinic distances - in and out of clinic and between activities  Assistive device utilized: RW  Level of assistance: CGA  Comments: Pt ambulating into session w/ RW with significant freezing today, pt needing cues to stay inside walker and picking up RLE during gait and focusing on foot clearance/heel strike. Cues at times to stop and reset and shift weight before continuign with gait. Continued cues for foot clearance and stride length, and staying inside RW during remainder of session between activities   Pt needs cues for marching/foot clearance when turing and turning all the way with RW before sitting down on SciFit seat or mat table. Pt with tendency to reach to mat table and sit down before turning all the way    Ther Ex  SciFit multi-peaks level 6.0  for 8 minutes using BUE/BLEs for neural priming for reciprocal movement, dynamic cardiovascular warmup and increased amplitude of stepping. Cued to fully turn around before going to sit down on the seat. Cues for foot clearance/marching when turning.  Pt able to keep spm >80, pt reporting RPE as 5/10   NMR  In // bars: With 4 floor dots in bars, side stepping in front of dot and then alternating heel taps, performing down and back x3 reps.  Then performed trying to side step down and back with no UE support, pt challaneged by this With 2" foam beam: Alternating lateral step overs x12 reps each side  Alternating forward step overs x15 reps each side, beginning with 4# ankle weight on RLE for improved foot clearance and then without  Quarter turns in (practicing in directions on a clock to 2 colorful floor dots as  targets) Performed 12 reps each side  Sit to stands from mat table and in standing working on shifting weight side to side, performed 7 reps total    PATIENT EDUCATION: Education details: Importance of weight shifting for freezing, continue HEP, purpose of exercises in therapy.  Person educated: Patient Education method: Explanation, Demonstration, and Verbal cues Education comprehension: verbalized understanding, returned demonstration, verbal cues required, and needs further education  HOME EXERCISE PROGRAM: Access Code: [email or web address] URL: [email or web address] Date: 06/22/2023 Prepared by: Sherlie Ban  Exercises - Standing Quarter Turn with Counter Support  - 1 x daily - 7 x weekly - 3 sets - 10 reps - Side to Side Weight Shift with Counter Support  - 1-2 x daily - 7 x weekly - 1-2 sets - 10 reps - Side Stepping with Counter Support  - 1-2 x daily - 7 x weekly - 1-2 sets - 10 reps  GOALS: Goals reviewed with patient? Yes  SHORT TERM GOALS: ALL STGS = LTGS  LONG TERM GOALS: Target date: 07/05/2023  Pt will be independent with final HEP for gait/balance in order to build upon functional gains made in therapy. Baseline:  Goal status: INITIAL  2.  Pt will verbalize/demo understanding of techniques to help with freezing episodes.  Baseline:  Goal status: INITIAL  3.  Pt will decr FOGQ to 9/24 or less in order to demo improved freezing.  Baseline: 12/24  Goal status: INITIAL  4.  Pt will improve TUG time to 17 seconds or less with LRAD vs. No AD with CGA in order to demo decrease fall risk.  Baseline: 21.22 seconds with min A  Goal status: INITIAL  5.  Pt will improve gait speed with LRAD to at least 1.8 ft/sec in order to demo improved community mobility.   Baseline: 22.44 seconds = 1.46 ft/sec with min A and no AD  Goal status: INITIAL  6.  Pt will improve 5x sit<>stand to less than or equal to 14 sec to demonstrate improved functional strength and  transfer efficiency.   Baseline: 16.6 seconds with no UE support, bracing with BLE on chair  Goal status: INITIAL   ASSESSMENT:  CLINICAL IMPRESSION: Pt with more freezing episodes when ambulating into session today with use of RW. Cued to stop and reset and shift weight before taking a big step. Today's skilled session focused on use of SciFit at start of session for neural priming/stepping intensity, Will continue per POC.    OBJECTIVE IMPAIRMENTS: Abnormal gait, decreased activity tolerance, decreased balance, decreased coordination, decreased knowledge of use of DME, decreased mobility, difficulty walking, decreased ROM, decreased strength, impaired flexibility, and postural dysfunction.   ACTIVITY LIMITATIONS: standing, squatting, transfers, bed mobility, and locomotion level  PARTICIPATION LIMITATIONS: community activity  PERSONAL FACTORS: Age, Behavior pattern, Past/current experiences, Time since onset of injury/illness/exacerbation, and 3+ comorbidities: bipolar disorder (1997),  HLD, HTN, OSA on CPAP, reverse R shoulder arthroplastic 04/2022, L total hip replacement 2020.   are also affecting patient's functional outcome.   REHAB POTENTIAL: Good  CLINICAL DECISION MAKING: Evolving/moderate complexity  EVALUATION COMPLEXITY: Moderate  PLAN:  PT FREQUENCY: 2x/week  PT DURATION: 8 weeks  PLANNED INTERVENTIONS: 97164- PT Re-evaluation, 97110-Therapeutic exercises, 97530- Therapeutic activity, 97112- Neuromuscular re-education, 97535- Self Care, 16109- Manual therapy, [phone]- Gait training, Patient/Family education, Balance training, Stair training, and DME instructions  PLAN FOR NEXT SESSION: Lateral weight shifting, turns. Work on freezing strategies. Work on gait with RW. Try SciFit at start of session. Rockerboard tasks, increased step clearance (RLE>LLE)   Drake Leach, PT ,DPT 06/26/2023, 1:18 PM

## 2023-06-29 ENCOUNTER — Ambulatory Visit: Payer: PPO | Admitting: Physical Therapy

## 2023-06-29 DIAGNOSIS — R2681 Unsteadiness on feet: Secondary | ICD-10-CM

## 2023-06-29 DIAGNOSIS — R2689 Other abnormalities of gait and mobility: Secondary | ICD-10-CM

## 2023-06-29 DIAGNOSIS — M6281 Muscle weakness (generalized): Secondary | ICD-10-CM

## 2023-06-29 NOTE — Therapy (Signed)
OUTPATIENT PHYSICAL THERAPY NEURO TREATMENT   Patient Name: AQUILA DELAUGHTER MRN: [id number] DOB:[birth year], 73 y.o., male Today's Date: 06/29/2023   PCP: [name], MD   REFERRING PROVIDER: Kateri Mc, MD  END OF SESSION:  PT End of Session - 06/29/23 1236     Visit Number 7    Number of Visits 9    Date for PT Re-Evaluation 08/06/23    Authorization Type [employer]    PT Start Time 1234    PT Stop Time 1315    PT Time Calculation (min) 41 min    Equipment Utilized During Treatment Gait belt    Activity Tolerance Patient tolerated treatment well    Behavior During Therapy WFL for tasks assessed/performed              Past Medical History:  Diagnosis Date   Arthritis    Bipolar disorder (HCC)    controlled by meds since 1997   Cancer (HCC)    basal cell and pre melanoma on back   Neuromuscular disorder (HCC)    gait and ambulation problems, but no diagnosis from Neurology   Pneumonia    Sleep apnea    cpap   Wears glasses    reading   Past Surgical History:  Procedure Laterality Date   APPENDECTOMY     COLONOSCOPY     KNEE ARTHROSCOPY W/ MENISCAL REPAIR  2004   left   ORIF HUMERUS FRACTURE Left 09/19/2013   Procedure: LEFT OPEN REDUCTION INTERNAL FIXATION (ORIF) PROXIMAL HUMERUS; ROTATOR CUFF REPAIR;  Surgeon: Loreta Ave, MD;  Location: [facility];  Service: Orthopedics;  Laterality: Left;   REVERSE SHOULDER ARTHROPLASTY Right 05/27/2022   Procedure: REVERSE SHOULDER ARTHROPLASTY;  Surgeon: Yolonda Kida, MD;  Location: [facility];  Service: Orthopedics;  Laterality: Right;  120   TONSILLECTOMY     TOTAL HIP ARTHROPLASTY Left 07/18/2018   Procedure: TOTAL HIP ARTHROPLASTY ANTERIOR APPROACH;  Surgeon: Samson Frederic, MD;  Location: [facility];  Service: Orthopedics;  Laterality: Left;   Patient Active Problem List   Diagnosis Date Noted   S/P reverse total shoulder arthroplasty, right 05/27/2022    Degenerative scoliosis in adult patient 03/06/2020   Bipolar I disorder (HCC) 12/03/2019   OSA on CPAP 11/22/2018   S/P total hip arthroplasty 07/19/2018   History of adenomatous polyp of colon 11/09/2017   Depression 07/21/2016   HTN (hypertension) 03/14/2016   Vitamin D deficiency 03/14/2016   Abnormal glucose 03/14/2016    ONSET DATE: 05/05/2023  REFERRING DIAG: R26.9 (ICD-10-CM) - Unspecified abnormalities of gait and mobility  THERAPY DIAG:  Unsteadiness on feet  Other abnormalities of gait and mobility  Muscle weakness (generalized)  Rationale for Evaluation and Treatment: Rehabilitation  SUBJECTIVE:  SUBJECTIVE STATEMENT: Pt taking Carbidopa-Levodopa 4x per day. Has not noticed a difference. Called his neurologist and left VM last week, has not heard back. No falls. Thinks the exercises are helping his walking but still has most difficulty with turns.   Pt accompanied by: self  PERTINENT HISTORY: PMH: bipolar disorder (1997), HLD, HTN, OSA on CPAP, reverse R shoulder arthroplastic 04/2022, L total hip replacement 2020.   Per neurologist on 04/11/23: He was evaluated for gait impairment. He has a history of left hip replacement and left shoulder repair. He had EMG in 2022 with a chronic L5 radiculopathy on right lower extremity. MRI brain showed L frontal infarct and other smaller infarcts. MRI C and T spine was unremarkable. EMG 09/2021 then was normal. He saw Dr. Everlena Cooper at [facility] who wondered about PD. DAT scan 08/2022 was positive. He started Sinemet but did not noticed improvements.  Today he reports that he has had walking and balance problems since 2022. This started in the spring 2022. He wonders if the COVID booster which preceded his symptoms could be to blame. He underwent the  workup as described above and it was thought that his gait impairments were related to spinal disease. However his gait has continued to decline. While he can get around the house he has to use a cane sometimes and has come today in a wheelchair given the longer distance. He often limps and has to hold and grab his leg to pull it across to ambulate. He has had a few falls in the past year. He loses his balance if he is not careful and watching how he steps. He feels like his right leg is weaker. Turns are very challenging and when trying to multitask such as carrying something while walking, he will be at higher risk of falling. He admits when I describe freezing of gait that this is what he experiences especially with his right leg. He denies festinating gait. He has never completed PT for these problems since onset of symptoms.   PAIN:  Are you having pain? No   PRECAUTIONS: Fall  FALLS: Has patient fallen in last 6 months? Yes. Number of falls 0-1  LIVING ENVIRONMENT: Lives with: lives alone, has family that lives close by, a bunch of kids and grandkids  Lives in: House/apartment Stairs: No, reports stairs are a little easier with picking up feet  Has following equipment at home: Single point cane and Has a walker with no wheels   PLOF: Independent, Vocation/Vocational requirements: Works part time as a Clinical research associate , and Leisure: likes to smoke barbecue   PATIENT GOALS: Wants to improve his walking   OBJECTIVE:  Note: Objective measures were completed at Evaluation unless otherwise noted.  DIAGNOSTIC FINDINGS: DAT SCAN 08/2022: IMPRESSION: Moderate decreased relative radiotracer activity posterior LEFT putamen. Equivocal exam could indicate early Parkinsonian syndrome pathology.  MRI brain 04/29/21: IMPRESSION: Mildly motion degraded exam.   No evidence of acute intracranial abnormality.   Small chronic cortically-based infarct within the medial right parietal lobe.   Chronic  lacunar infarct within the posterior left frontal lobe white matter. Minimal background cerebral white matter chronic small vessel ischemic disease.   Mild-to-moderate generalized cerebral and cerebellar atrophy.  COGNITION: Overall cognitive status: Within functional limits for tasks assessed and Pt denies changes in thinking or memory.    SENSATION: WFL for light touch at BLE  COORDINATION: Heel to shin: Bradykinetic and harder to perform with RLE    MUSCLE TONE: Mild rigidity RLE  POSTURE: rounded shoulders, forward flexed posture in standing with gait   LOWER EXTREMITY ROM:    Pt with bilateral knee flexion in standing    LOWER EXTREMITY MMT:    MMT Right Eval Left Eval  Hip flexion 4- 4  Hip extension    Hip abduction    Hip adduction    Hip internal rotation    Hip external rotation    Knee flexion 4+ 4+  Knee extension 4+ 5  Ankle dorsiflexion 3- 4  Ankle plantarflexion    Ankle inversion    Ankle eversion    (Blank rows = not tested)  BED MOBILITY:  Pt reports difficulty sometimes getting up due to R shoulder (hx of R shoulder surgery at end of 2023)  TRANSFERS: Assistive device utilized: None  Sit to stand: SBA and CGA Stand to sit: SBA and CGA  Performs with no UE support, decr anterior weight shift, pt with knees flexed in standing   Pt with initiation freezing in standing, before pt goes to walk.    FUNCTIONAL TESTS:  5 times sit to stand: 16.6 seconds with no UE support, bracing with BLE on chair  Timed up and go (TUG): 21.22 seconds with min A when turning and for freezing episodes during turns and straight away   10 meter walk test: 22.44 seconds = 1.46 ft/sec with min A and no AD  PATIENT SURVEYS:  Freezing of gait questionnaire 12/24 (higher score denotes more severe freezing of gait)                                                                                                                              TREATMENT DATE:    GAIT: Gait pattern: decreased arm swing- Right, decreased arm swing- Left, decreased step length- Right, decreased step length- Left, decreased stride length, decreased ankle dorsiflexion- Right, decreased ankle dorsiflexion- Left, Right foot flat, Left foot flat, shuffling, decreased trunk rotation, trunk flexed, wide BOS, and poor foot clearance- Right Distance walked: Clinic distances - in and out of clinic and between activities  Assistive device utilized: RW and none  Level of assistance: SBA and CGA  Comments: Pt ambulating into and out of session w/ RW, increased freezing noted while ambulating through doorway. Pt ambulated throughout session w/no AD and CGA, but did note improved weight shift, facilitation of heel strike and reduced freezing without AD as pt not leaning forward.    Ther Act  Discussed progress in PT and pt reports he does think it has been helpful, especially with turns. Pt reports he notices no change in his mobility w/Carbidopa-Levodopa. Educated pt on NPH and findings on most recent MRI, but pt reports he does not have urinary incontinence or cognitive issues so does not think he has this.   Ther Ex  SciFit multi-peaks level 12.0  for 8 minutes using BUE/BLEs for neural priming for reciprocal movement, dynamic cardiovascular warmup and increased amplitude of stepping. No  cues required to fully turn to sit on chair today.   NMR  In // bars for improved lateral weight shifting, single leg stability and LE coordination: Fwd/retro gait to dot targets to facilitate LARGE step and lateral weight shift, x30' each direction w/intermittent UE support. Pt w/significant difficulty w/retro stepping but did improve w/cues for lateral weight shifting. Progressed to fwd/retro gait w/no visual targets and pt demonstrated good carryover of weight shift and step clearance without visual targets.  6 Blaze pods on random reach setting performed on 2 minute intervals with 30s rest  periods. Pt requires SBA guarding and BUE support. Round 1:  3 pods placed on either side of // bars w/emphasis on turning and side stepping to perform.  17 hits but pt not fully turning or sidestepping.  Round 2:  same setup.  12 hits w/improved adherence to instructions this date. Notable errors/deficits:  significant freezing w/turns, increased difficulty w/lateral stepping to R side    PATIENT EDUCATION: Education details: Importance of weight shifting for freezing, continue HEP, purpose of exercises in therapy.  Person educated: Patient Education method: Explanation, Demonstration, and Verbal cues Education comprehension: verbalized understanding, returned demonstration, verbal cues required, and needs further education  HOME EXERCISE PROGRAM: Access Code: [email or web address] URL: [email or web address] Date: 06/22/2023 Prepared by: Sherlie Ban  Exercises - Standing Quarter Turn with Counter Support  - 1 x daily - 7 x weekly - 3 sets - 10 reps - Side to Side Weight Shift with Counter Support  - 1-2 x daily - 7 x weekly - 1-2 sets - 10 reps - Side Stepping with Counter Support  - 1-2 x daily - 7 x weekly - 1-2 sets - 10 reps  GOALS: Goals reviewed with patient? Yes  SHORT TERM GOALS: ALL STGS = LTGS  LONG TERM GOALS: Target date: 07/05/2023  Pt will be independent with final HEP for gait/balance in order to build upon functional gains made in therapy. Baseline:  Goal status: INITIAL  2.  Pt will verbalize/demo understanding of techniques to help with freezing episodes.  Baseline:  Goal status: INITIAL  3.  Pt will decr FOGQ to 9/24 or less in order to demo improved freezing.  Baseline: 12/24  Goal status: INITIAL  4.  Pt will improve TUG time to 17 seconds or less with LRAD vs. No AD with CGA in order to demo decrease fall risk.  Baseline: 21.22 seconds with min A  Goal status: INITIAL  5.  Pt will improve gait speed with LRAD to at least 1.8 ft/sec in order to  demo improved community mobility.   Baseline: 22.44 seconds = 1.46 ft/sec with min A and no AD  Goal status: INITIAL  6.  Pt will improve 5x sit<>stand to less than or equal to 14 sec to demonstrate improved functional strength and transfer efficiency.   Baseline: 16.6 seconds with no UE support, bracing with BLE on chair  Goal status: INITIAL   ASSESSMENT:  CLINICAL IMPRESSION: Emphasis of skilled PT session on lateral weight shifting, LE coordination, freezing strategies and single leg stability. Pt ambulated throughout session w/o AD but continues to be limited by significant freezing w/turns and poor adherence to freezing strategies even w/verbal cues. Pt reports he does think therapy has been helpful but notices no difference w/Carbidopa-Levodopa. He has contacted his neurologist about this but has not heard back.  Will continue per POC.    OBJECTIVE IMPAIRMENTS: Abnormal gait, decreased activity tolerance, decreased balance, decreased coordination, decreased knowledge of use  of DME, decreased mobility, difficulty walking, decreased ROM, decreased strength, impaired flexibility, and postural dysfunction.   ACTIVITY LIMITATIONS: standing, squatting, transfers, bed mobility, and locomotion level  PARTICIPATION LIMITATIONS: community activity  PERSONAL FACTORS: Age, Behavior pattern, Past/current experiences, Time since onset of injury/illness/exacerbation, and 3+ comorbidities: bipolar disorder (1997), HLD, HTN, OSA on CPAP, reverse R shoulder arthroplastic 04/2022, L total hip replacement 2020.   are also affecting patient's functional outcome.   REHAB POTENTIAL: Good  CLINICAL DECISION MAKING: Evolving/moderate complexity  EVALUATION COMPLEXITY: Moderate  PLAN:  PT FREQUENCY: 2x/week  PT DURATION: 8 weeks  PLANNED INTERVENTIONS: 97164- PT Re-evaluation, 97110-Therapeutic exercises, 97530- Therapeutic activity, 97112- Neuromuscular re-education, 97535- Self Care, 84132-  Manual therapy, [phone]- Gait training, Patient/Family education, Balance training, Stair training, and DME instructions  PLAN FOR NEXT SESSION: Lateral weight shifting, turns. Work on freezing strategies. Work on gait with RW. Try SciFit at start of session. Rockerboard tasks, increased step clearance (RLE>LLE), blaze pods    Shondrika Hoque E Arlicia Paquette, PT ,DPT 06/29/2023, 1:22 PM

## 2023-07-04 ENCOUNTER — Ambulatory Visit: Payer: PPO | Attending: Neurology | Admitting: Physical Therapy

## 2023-07-04 ENCOUNTER — Ambulatory Visit: Payer: PPO | Admitting: Nurse Practitioner

## 2023-07-04 ENCOUNTER — Encounter: Payer: Self-pay | Admitting: Physical Therapy

## 2023-07-04 DIAGNOSIS — M6281 Muscle weakness (generalized): Secondary | ICD-10-CM | POA: Diagnosis present

## 2023-07-04 DIAGNOSIS — R29818 Other symptoms and signs involving the nervous system: Secondary | ICD-10-CM | POA: Insufficient documentation

## 2023-07-04 DIAGNOSIS — R2681 Unsteadiness on feet: Secondary | ICD-10-CM | POA: Diagnosis present

## 2023-07-04 DIAGNOSIS — R2689 Other abnormalities of gait and mobility: Secondary | ICD-10-CM | POA: Diagnosis present

## 2023-07-04 NOTE — Therapy (Signed)
 OUTPATIENT PHYSICAL THERAPY NEURO TREATMENT   Patient Name: Timothy [name] MRN: [id number] DOB:11-27-1950, 73 y.o., male Today's Date: 07/04/2023   PCP: Tonita Fallow, MD   REFERRING PROVIDER: Hans Cinderella Duck, MD  END OF SESSION:  PT End of Session - 07/04/23 1154     Visit Number 8    Number of Visits 9    Date for PT Re-Evaluation 08/06/23    Authorization Type [employer]    PT Start Time 1152   pt arrived late   PT Stop Time 1230    PT Time Calculation (min) 38 min    Equipment Utilized During Treatment Gait belt    Activity Tolerance Patient tolerated treatment well    Behavior During Therapy WFL for tasks assessed/performed              Past Medical History:  Diagnosis Date   Arthritis    Bipolar disorder (HCC)    controlled by meds since 1997   Cancer (HCC)    basal cell and pre melanoma on back   Neuromuscular disorder (HCC)    gait and ambulation problems, but no diagnosis from Neurology   Pneumonia    Sleep apnea    cpap   Wears glasses    reading   Past Surgical History:  Procedure Laterality Date   APPENDECTOMY     COLONOSCOPY     KNEE ARTHROSCOPY W/ MENISCAL REPAIR  2004   left   ORIF HUMERUS FRACTURE Left 09/19/2013   Procedure: LEFT OPEN REDUCTION INTERNAL FIXATION (ORIF) PROXIMAL HUMERUS; ROTATOR CUFF REPAIR;  Surgeon: Toribio JULIANNA Chancy, MD;  Location: [facility];  Service: Orthopedics;  Laterality: Left;   REVERSE SHOULDER ARTHROPLASTY Right 05/27/2022   Procedure: REVERSE SHOULDER ARTHROPLASTY;  Surgeon: Sharl Selinda Dover, MD;  Location: [facility];  Service: Orthopedics;  Laterality: Right;  120   TONSILLECTOMY     TOTAL HIP ARTHROPLASTY Left 07/18/2018   Procedure: TOTAL HIP ARTHROPLASTY ANTERIOR APPROACH;  Surgeon: Fidel Rogue, MD;  Location: [facility];  Service: Orthopedics;  Laterality: Left;   Patient Active Problem List   Diagnosis Date Noted   S/P reverse total shoulder arthroplasty, right  05/27/2022   Degenerative scoliosis in adult patient 03/06/2020   Bipolar I disorder (HCC) 12/03/2019   OSA on CPAP 11/22/2018   S/P total hip arthroplasty 07/19/2018   History of adenomatous polyp of colon 11/09/2017   Depression 07/21/2016   HTN (hypertension) 03/14/2016   Vitamin D  deficiency 03/14/2016   Abnormal glucose 03/14/2016    ONSET DATE: 05/05/2023  REFERRING DIAG: R26.9 (ICD-10-CM) - Unspecified abnormalities of gait and mobility  THERAPY DIAG:  Unsteadiness on feet  Other abnormalities of gait and mobility  Muscle weakness (generalized)  Other symptoms and signs involving the nervous system  Rationale for Evaluation and Treatment: Rehabilitation  SUBJECTIVE:  SUBJECTIVE STATEMENT: Pt reports that his PCP passed away, so they are trying to find a new one for the practice. Pt reports only using the RW when coming into therapy. Otherwise, does not use an AD and feels like he walks better without one. Feels like he is improving a little bit and feels like the exercises are helping some.   Pt accompanied by: self  PERTINENT HISTORY: PMH: bipolar disorder (1997), HLD, HTN, OSA on CPAP, reverse R shoulder arthroplastic 04/2022, L total hip replacement 2020.   Per neurologist on 04/11/23: He was evaluated for gait impairment. He has a history of left hip replacement and left shoulder repair. He had EMG in 2022 with a chronic L5 radiculopathy on right lower extremity. MRI brain showed L frontal infarct and other smaller infarcts. MRI C and T spine was unremarkable. EMG 09/2021 then was normal. He saw Dr. Skeet at [facility] who wondered about PD. DAT scan 08/2022 was positive. He started Sinemet  but did not noticed improvements.  Today he reports that he has had walking and balance problems  since 2022. This started in the spring 2022. He wonders if the COVID booster which preceded his symptoms could be to blame. He underwent the workup as described above and it was thought that his gait impairments were related to spinal disease. However his gait has continued to decline. While he can get around the house he has to use a cane sometimes and has come today in a wheelchair given the longer distance. He often limps and has to hold and grab his leg to pull it across to ambulate. He has had a few falls in the past year. He loses his balance if he is not careful and watching how he steps. He feels like his right leg is weaker. Turns are very challenging and when trying to multitask such as carrying something while walking, he will be at higher risk of falling. He admits when I describe freezing of gait that this is what he experiences especially with his right leg. He denies festinating gait. He has never completed PT for these problems since onset of symptoms.   PAIN:  Are you having pain? No   PRECAUTIONS: Fall  FALLS: Has patient fallen in last 6 months? Yes. Number of falls 0-1  LIVING ENVIRONMENT: Lives with: lives alone, has family that lives close by, a bunch of kids and grandkids  Lives in: House/apartment Stairs: No, reports stairs are a little easier with picking up feet  Has following equipment at home: Single point cane and Has a walker with no wheels   PLOF: Independent, Vocation/Vocational requirements: Works part time as a clinical research associate , and Leisure: likes to smoke barbecue   PATIENT GOALS: Wants to improve his walking   OBJECTIVE:  Note: Objective measures were completed at Evaluation unless otherwise noted.  DIAGNOSTIC FINDINGS: DAT SCAN 08/2022: IMPRESSION: Moderate decreased relative radiotracer activity posterior LEFT putamen. Equivocal exam could indicate early Parkinsonian syndrome pathology.  MRI brain 04/29/21: IMPRESSION: Mildly motion degraded exam.   No  evidence of acute intracranial abnormality.   Small chronic cortically-based infarct within the medial right parietal lobe.   Chronic lacunar infarct within the posterior left frontal lobe white matter. Minimal background cerebral white matter chronic small vessel ischemic disease.   Mild-to-moderate generalized cerebral and cerebellar atrophy.  COGNITION: Overall cognitive status: Within functional limits for tasks assessed and Pt denies changes in thinking or memory.    SENSATION: WFL for light touch at BLE  COORDINATION: Heel to shin: Bradykinetic and harder to perform with RLE    MUSCLE TONE: Mild rigidity RLE     POSTURE: rounded shoulders, forward flexed posture in standing with gait   LOWER EXTREMITY ROM:    Pt with bilateral knee flexion in standing    LOWER EXTREMITY MMT:    MMT Right Eval Left Eval  Hip flexion 4- 4  Hip extension    Hip abduction    Hip adduction    Hip internal rotation    Hip external rotation    Knee flexion 4+ 4+  Knee extension 4+ 5  Ankle dorsiflexion 3- 4  Ankle plantarflexion    Ankle inversion    Ankle eversion    (Blank rows = not tested)  BED MOBILITY:  Pt reports difficulty sometimes getting up due to R shoulder (hx of R shoulder surgery at end of 2023)  TRANSFERS: Assistive device utilized: None  Sit to stand: SBA and CGA Stand to sit: SBA and CGA  Performs with no UE support, decr anterior weight shift, pt with knees flexed in standing   Pt with initiation freezing in standing, before pt goes to walk.    FUNCTIONAL TESTS:  5 times sit to stand: 16.6 seconds with no UE support, bracing with BLE on chair  Timed up and go (TUG): 21.22 seconds with min A when turning and for freezing episodes during turns and straight away   10 meter walk test: 22.44 seconds = 1.46 ft/sec with min A and no AD  PATIENT SURVEYS:  Freezing of gait questionnaire 12/24 (higher score denotes more severe freezing of gait)                                                                                                                               TREATMENT DATE:   GAIT: Gait pattern: decreased arm swing- Right, decreased arm swing- Left, decreased step length- Right, decreased step length- Left, decreased stride length, decreased ankle dorsiflexion- Right, decreased ankle dorsiflexion- Left, Right foot flat, Left foot flat, shuffling, decreased trunk rotation, trunk flexed, wide BOS, and poor foot clearance- Right Distance walked: Clinic distances - in and out of clinic and between activities  Assistive device utilized: RW and none  Level of assistance: SBA and CGA  Comments: Pt ambulating into and out of session w/ RW, increased freezing noted with continued reminder cues to stay closer to RW and ambulate with heel strike and incr stride length. Pt ambulated throughout session w/no AD and CGA, with continued freezing episodes   Ther Act  Discussed progress in PT and pt reports he does think it has been helpful and does want to continue PT to work on his balance. PT discussed will assess pt's goals at next session and determine POC going forwards. Pt reports he notices no change in his mobility w/Carbidopa -Levodopa . PT reports that she has reached out to pt's neurologist regarding this, but has not heard back.  Discussed Dr. Evonnie in [place] if pt wants to get another opinion regarding his symptoms as it would be closer to home. Pt reports he has been seen before by Dr. Skeet at that same office, discussed to see if pt would be able to switch providers to a movement disorder specialist.    NMR  On air ex: Alternating SLS taps to 6 step 12 reps each side, then cross body taps 12 reps each side to work on weight shifting, foot clearance, pt needing intermittent UE support for balance and CGA from therapist  10 reps sit <> stands without UE support, cues for incr forward lean, and scooting out towards the edge of mat table, to  work on immediate standing balance  3 x 10 reps 4# med ball slams to floor and catching it, cues to go more slowly and reset with tall posture before throwing it to ground, CGA for balance  Standing in front of mirror: Reaching across body and grabbing Squigz and then when ipsilateral hand, weight shifting and placing it on the mirror laterally, performed 2 sets of 10 reps each side, pt with incr anterior weight shifting onto toes through and frequently leaning again the ballet bar for balance, pt with decr R shoulder AROM  Trying to weight shift A/P onto toes and then onto heels, pt with decr posterior weight shift   PATIENT EDUCATION: Education details: See therapeutic activity section Person educated: Patient Education method: Explanation, Demonstration, and Verbal cues Education comprehension: verbalized understanding, returned demonstration, verbal cues required, and needs further education  HOME EXERCISE PROGRAM: Access Code: [email or web address] URL: [email or web address] Date: 06/22/2023 Prepared by: Sheffield Senate  Exercises - Standing Quarter Turn with Counter Support  - 1 x daily - 7 x weekly - 3 sets - 10 reps - Side to Side Weight Shift with Counter Support  - 1-2 x daily - 7 x weekly - 1-2 sets - 10 reps - Side Stepping with Counter Support  - 1-2 x daily - 7 x weekly - 1-2 sets - 10 reps  GOALS: Goals reviewed with patient? Yes  SHORT TERM GOALS: ALL STGS = LTGS  LONG TERM GOALS: Target date: 07/05/2023  Pt will be independent with final HEP for gait/balance in order to build upon functional gains made in therapy. Baseline:  Goal status: INITIAL  2.  Pt will verbalize/demo understanding of techniques to help with freezing episodes.  Baseline:  Goal status: INITIAL  3.  Pt will decr FOGQ to 9/24 or less in order to demo improved freezing.  Baseline: 12/24  Goal status: INITIAL  4.  Pt will improve TUG time to 17 seconds or less with LRAD vs. No AD with CGA in  order to demo decrease fall risk.  Baseline: 21.22 seconds with min A  Goal status: INITIAL  5.  Pt will improve gait speed with LRAD to at least 1.8 ft/sec in order to demo improved community mobility.   Baseline: 22.44 seconds = 1.46 ft/sec with min A and no AD  Goal status: INITIAL  6.  Pt will improve 5x sit<>stand to less than or equal to 14 sec to demonstrate improved functional strength and transfer efficiency.   Baseline: 16.6 seconds with no UE support, bracing with BLE on chair  Goal status: INITIAL   ASSESSMENT:  CLINICAL IMPRESSION: Today's skilled session continued to focus on weight shifting, SLS stability, coordination, and balance on compliant surfaces. Pt ambulated into clinic with RW and between activities with no AD. With no  AD, pt continues with wider BOS and freezing episodes. Pt reporting that he has improved balance with gait with no AD and only brings the RW when coming into therapy. Pt challenged by balance tasks on compliant surfaces, and tends to lose balance anteriorly onto toes. Will continue per POC.    OBJECTIVE IMPAIRMENTS: Abnormal gait, decreased activity tolerance, decreased balance, decreased coordination, decreased knowledge of use of DME, decreased mobility, difficulty walking, decreased ROM, decreased strength, impaired flexibility, and postural dysfunction.   ACTIVITY LIMITATIONS: standing, squatting, transfers, bed mobility, and locomotion level  PARTICIPATION LIMITATIONS: community activity  PERSONAL FACTORS: Age, Behavior pattern, Past/current experiences, Time since onset of injury/illness/exacerbation, and 3+ comorbidities: bipolar disorder (1997), HLD, HTN, OSA on CPAP, reverse R shoulder arthroplastic 04/2022, L total hip replacement 2020.   are also affecting patient's functional outcome.   REHAB POTENTIAL: Good  CLINICAL DECISION MAKING: Evolving/moderate complexity  EVALUATION COMPLEXITY: Moderate  PLAN:  PT FREQUENCY:  2x/week  PT DURATION: 8 weeks  PLANNED INTERVENTIONS: 97164- PT Re-evaluation, 97110-Therapeutic exercises, 97530- Therapeutic activity, 97112- Neuromuscular re-education, 97535- Self Care, [id number]- Manual therapy, [phone]- Gait training, Patient/Family education, Balance training, Stair training, and DME instructions  PLAN FOR NEXT SESSION: CHECK GOALS, ADD MORE VISITS?   Lateral weight shifting, turns. Work on freezing strategies. Work on gait with RW. Try SciFit at start of session. Rockerboard tasks, increased step clearance (RLE>LLE), blaze pods    Sheffield LOISE Senate, PT ,DPT 07/04/2023, 12:56 PM

## 2023-07-06 ENCOUNTER — Encounter: Payer: Self-pay | Admitting: Physical Therapy

## 2023-07-06 ENCOUNTER — Ambulatory Visit: Payer: PPO | Admitting: Physical Therapy

## 2023-07-06 DIAGNOSIS — R2689 Other abnormalities of gait and mobility: Secondary | ICD-10-CM

## 2023-07-06 DIAGNOSIS — R2681 Unsteadiness on feet: Secondary | ICD-10-CM | POA: Diagnosis not present

## 2023-07-06 DIAGNOSIS — R29818 Other symptoms and signs involving the nervous system: Secondary | ICD-10-CM

## 2023-07-06 NOTE — Therapy (Signed)
 OUTPATIENT PHYSICAL THERAPY NEURO TREATMENT/10th VISIT PN   Patient Name: Timothy [name] MRN: [id number] DOB:1951-02-28, 73 y.o., male Today's Date: 07/06/2023   PCP: Tonita Fallow, MD   REFERRING PROVIDER: Hans Cinderella Duck, MD  10th Visit Physical Therapy Progress Note  Dates of Reporting Period: 06/07/23 to 07/06/23  END OF SESSION:  PT End of Session - 07/06/23 1153     Visit Number 9    Number of Visits 13    Date for PT Re-Evaluation 08/06/23    Authorization Type [employer]    Progress Note Due on Visit --   performed on visit 9   PT Start Time 1150    PT Stop Time 1229    PT Time Calculation (min) 39 min    Equipment Utilized During Treatment Gait belt    Activity Tolerance Patient tolerated treatment well    Behavior During Therapy WFL for tasks assessed/performed              Past Medical History:  Diagnosis Date   Arthritis    Bipolar disorder (HCC)    controlled by meds since 1997   Cancer (HCC)    basal cell and pre melanoma on back   Neuromuscular disorder (HCC)    gait and ambulation problems, but no diagnosis from Neurology   Pneumonia    Sleep apnea    cpap   Wears glasses    reading   Past Surgical History:  Procedure Laterality Date   APPENDECTOMY     COLONOSCOPY     KNEE ARTHROSCOPY W/ MENISCAL REPAIR  2004   left   ORIF HUMERUS FRACTURE Left 09/19/2013   Procedure: LEFT OPEN REDUCTION INTERNAL FIXATION (ORIF) PROXIMAL HUMERUS; ROTATOR CUFF REPAIR;  Surgeon: Toribio JULIANNA Chancy, MD;  Location: [facility];  Service: Orthopedics;  Laterality: Left;   REVERSE SHOULDER ARTHROPLASTY Right 05/27/2022   Procedure: REVERSE SHOULDER ARTHROPLASTY;  Surgeon: Sharl Selinda Dover, MD;  Location: [facility];  Service: Orthopedics;  Laterality: Right;  120   TONSILLECTOMY     TOTAL HIP ARTHROPLASTY Left 07/18/2018   Procedure: TOTAL HIP ARTHROPLASTY ANTERIOR APPROACH;  Surgeon: Fidel Rogue, MD;  Location: [facility];   Service: Orthopedics;  Laterality: Left;   Patient Active Problem List   Diagnosis Date Noted   S/P reverse total shoulder arthroplasty, right 05/27/2022   Degenerative scoliosis in adult patient 03/06/2020   Bipolar I disorder (HCC) 12/03/2019   OSA on CPAP 11/22/2018   S/P total hip arthroplasty 07/19/2018   History of adenomatous polyp of colon 11/09/2017   Depression 07/21/2016   HTN (hypertension) 03/14/2016   Vitamin D  deficiency 03/14/2016   Abnormal glucose 03/14/2016    ONSET DATE: 05/05/2023  REFERRING DIAG: R26.9 (ICD-10-CM) - Unspecified abnormalities of gait and mobility  THERAPY DIAG:  Unsteadiness on feet  Other abnormalities of gait and mobility  Other symptoms and signs involving the nervous system  Rationale for Evaluation and Treatment: Rehabilitation  SUBJECTIVE:  SUBJECTIVE STATEMENT: Pt reports no changes, no falls. Went out to sushi last night with his granddaughters and did not bring his RW.   Pt accompanied by: self  PERTINENT HISTORY: PMH: bipolar disorder (1997), HLD, HTN, OSA on CPAP, reverse R shoulder arthroplastic 04/2022, L total hip replacement 2020.   Per neurologist on 04/11/23: He was evaluated for gait impairment. He has a history of left hip replacement and left shoulder repair. He had EMG in 2022 with a chronic L5 radiculopathy on right lower extremity. MRI brain showed L frontal infarct and other smaller infarcts. MRI C and T spine was unremarkable. EMG 09/2021 then was normal. He saw Dr. Skeet at [facility] who wondered about PD. DAT scan 08/2022 was positive. He started Sinemet  but did not noticed improvements.  Today he reports that he has had walking and balance problems since 2022. This started in the spring 2022. He wonders if the COVID booster  which preceded his symptoms could be to blame. He underwent the workup as described above and it was thought that his gait impairments were related to spinal disease. However his gait has continued to decline. While he can get around the house he has to use a cane sometimes and has come today in a wheelchair given the longer distance. He often limps and has to hold and grab his leg to pull it across to ambulate. He has had a few falls in the past year. He loses his balance if he is not careful and watching how he steps. He feels like his right leg is weaker. Turns are very challenging and when trying to multitask such as carrying something while walking, he will be at higher risk of falling. He admits when I describe freezing of gait that this is what he experiences especially with his right leg. He denies festinating gait. He has never completed PT for these problems since onset of symptoms.   PAIN:  Are you having pain? No   PRECAUTIONS: Fall  FALLS: Has patient fallen in last 6 months? Yes. Number of falls 0-1  LIVING ENVIRONMENT: Lives with: lives alone, has family that lives close by, a bunch of kids and grandkids  Lives in: House/apartment Stairs: No, reports stairs are a little easier with picking up feet  Has following equipment at home: Single point cane and Has a walker with no wheels   PLOF: Independent, Vocation/Vocational requirements: Works part time as a clinical research associate , and Leisure: likes to smoke barbecue   PATIENT GOALS: Wants to improve his walking   OBJECTIVE:  Note: Objective measures were completed at Evaluation unless otherwise noted.  DIAGNOSTIC FINDINGS: DAT SCAN 08/2022: IMPRESSION: Moderate decreased relative radiotracer activity posterior LEFT putamen. Equivocal exam could indicate early Parkinsonian syndrome pathology.  MRI brain 04/29/21: IMPRESSION: Mildly motion degraded exam.   No evidence of acute intracranial abnormality.   Small chronic cortically-based  infarct within the medial right parietal lobe.   Chronic lacunar infarct within the posterior left frontal lobe white matter. Minimal background cerebral white matter chronic small vessel ischemic disease.   Mild-to-moderate generalized cerebral and cerebellar atrophy.  COGNITION: Overall cognitive status: Within functional limits for tasks assessed and Pt denies changes in thinking or memory.    SENSATION: WFL for light touch at BLE  COORDINATION: Heel to shin: Bradykinetic and harder to perform with RLE    MUSCLE TONE: Mild rigidity RLE     POSTURE: rounded shoulders, forward flexed posture in standing with gait   LOWER EXTREMITY  ROM:    Pt with bilateral knee flexion in standing    LOWER EXTREMITY MMT:    MMT Right Eval Left Eval  Hip flexion 4- 4  Hip extension    Hip abduction    Hip adduction    Hip internal rotation    Hip external rotation    Knee flexion 4+ 4+  Knee extension 4+ 5  Ankle dorsiflexion 3- 4  Ankle plantarflexion    Ankle inversion    Ankle eversion    (Blank rows = not tested)  BED MOBILITY:  Pt reports difficulty sometimes getting up due to R shoulder (hx of R shoulder surgery at end of 2023)  TRANSFERS: Assistive device utilized: None  Sit to stand: SBA and CGA Stand to sit: SBA and CGA  Performs with no UE support, decr anterior weight shift, pt with knees flexed in standing   Pt with initiation freezing in standing, before pt goes to walk.    FUNCTIONAL TESTS:  5 times sit to stand: 16.6 seconds with no UE support, bracing with BLE on chair  Timed up and go (TUG): 21.22 seconds with min A when turning and for freezing episodes during turns and straight away   10 meter walk test: 22.44 seconds = 1.46 ft/sec with min A and no AD  PATIENT SURVEYS:  Freezing of gait questionnaire 12/24 (higher score denotes more severe freezing of gait)                                                                                                                               TREATMENT DATE:   GAIT: Gait pattern: decreased arm swing- Right, decreased arm swing- Left, decreased step length- Right, decreased step length- Left, decreased stride length, decreased ankle dorsiflexion- Right, decreased ankle dorsiflexion- Left, Right foot flat, Left foot flat, shuffling, decreased trunk rotation, trunk flexed, wide BOS, and poor foot clearance- Right Distance walked: Clinic distances - in and out of clinic and between activities  Assistive device utilized: RW and none  Level of assistance: SBA and CGA  Comments: Pt ambulating into and out of session w/ RW,  continued reminder cues to stay closer to RW and ambulate with heel strike and incr stride length.   Pt ambulated throughout session primarily w/no AD and CGA, with continued freezing episodes and decr foot clearance with RLE, esp when turning   When going to sit down on mat table or chairs during session with RW or with no AD, cues needed to turn all the way around before sitting down   Ther Act  Freezing of gait questionnaire: 12/24 (higher score denotes more severe freezing of gait), no change from eval   Goal Assessment: 5x sit <> stand: 11.8 seconds with no UE support, no BLE bracing against mat table, needs initial reminder cues to focus on proper technique rather than trying to go as fast as he can TUG:  22.4 seconds with no AD  and min A when turning, 2nd attempt: 17.4 seconds with no AD with CGA when turning when pt focusing on turning strategies with weight shifting and picking up feet 23.2 seconds with RW, supervision from therapist, pt with improved turning technique Gait speed:  14.6 seconds with no AD with close CGA/min A = 2.25 ft/sec, pt with decr RLE foot clearance 17.1 seconds with RW= 1.91 ft/sec with supervision/CGA  Went over freezing strategies with pt: pt reporting focusing on picking up his right leg during gait to help with freezing. Continued to ask about  what pt would do when he had a freezing episode, reviewed needing to stop/reset and weight shift/march and went over why weight shifting is needed for freezing episodes   When adding more appts and what pt wants to keep working on with therapy, pt wants to keep working on turning techniques   360 degree turn to R: 8.1 seconds  360 degree turn to L: 12 seconds with min A due to incr anterior weight shift onto toes    Access Code: [email or web address] URL: [email or web address] Date: 07/06/2023 Prepared by: Sheffield Senate  Reviewed bolded exercise for turning and added in SLS stability for weight shifting:   Exercises - Standing Quarter Turn with Counter Support  - 1-2 x daily - 7 x weekly - 3 sets - 10 reps - needing reminder for turning - reminder cues for deliberate picking up feet when turning, needing to use UE support when turning  - Side to Side Weight Shift with Counter Support  - 1-2 x daily - 7 x weekly - 1-2 sets - 10 reps - Side Stepping with Counter Support  - 1-2 x daily - 7 x weekly - 1-2 sets - 10 reps - Standing Single Leg Stance with Counter Support  - 1-2 x daily - 7 x weekly - 3 sets - 10-15 hold - pt more challenged with SLS on LLE and picking up RLE   PATIENT EDUCATION: Education details: Results of goals, purpose of weight shifting with freezing, discussed therapy recommendation of using RW to decr fall risk (pt only brings in RW to therapy and does not use it at any other time) to assist with balance, review of turning exercise for HEP and adding in SLS to HEP  Person educated: Patient Education method: Explanation, Demonstration, Verbal cues, and Handouts Education comprehension: verbalized understanding, returned demonstration, verbal cues required, and needs further education  HOME EXERCISE PROGRAM: Access Code: [email or web address] URL: [email or web address] Date: 07/06/2023 Prepared by: Sheffield Senate  Exercises - Standing Quarter Turn with Counter  Support  - 1-2 x daily - 7 x weekly - 3 sets - 10 reps - Side to Side Weight Shift with Counter Support  - 1-2 x daily - 7 x weekly - 1-2 sets - 10 reps - Side Stepping with Counter Support  - 1-2 x daily - 7 x weekly - 1-2 sets - 10 reps - Standing Single Leg Stance with Counter Support  - 1-2 x daily - 7 x weekly - 3 sets - 10-15 hold  GOALS: Goals reviewed with patient? Yes  SHORT TERM GOALS: ALL STGS = LTGS  LONG TERM GOALS: Target date: 07/05/2023  Pt will be independent with final HEP for gait/balance in order to build upon functional gains made in therapy. Baseline:  Goal status: ON-GOING  2.  Pt will verbalize/demo understanding of techniques to help with freezing episodes.  Baseline: does need reminders  Goal status: PARTIALLY MET  3.  Pt will decr  FOGQ to 9/24 or less in order to demo improved freezing.  Baseline: 12/24   12/24 on 2/6 Goal status: NOT MET   4.  Pt will improve TUG time to 17 seconds or less with LRAD vs. No AD with CGA in order to demo decrease fall risk.  Baseline: 21.22 seconds with min A   17.4 seconds with no AD with CGA (when pt focuses on turning techniques) Goal status: MET  5.  Pt will improve gait speed with LRAD to at least 1.8 ft/sec in order to demo improved community mobility.   Baseline: 22.44 seconds = 1.46 ft/sec with min A and no AD  On 2/6: 14.6 seconds with no AD with close CGA/min A = 2.25 ft/sec 17.1 seconds with RW= 1.91 ft/sec  Goal status: PARTIALLY MET  6.  Pt will improve 5x sit<>stand to less than or equal to 14 sec to demonstrate improved functional strength and transfer efficiency.   Baseline: 16.6 seconds with no UE support, bracing with BLE on chair   11.8 seconds with no UE support, no BLE bracing against mat table  Goal status: MET  LONG TERM GOALS: Target date: 08/03/2023 UPDATED LTG DATE FOR POC  Pt will be independent with final HEP for gait/balance in order to build upon functional gains made in  therapy. Baseline:  Goal status: ON-GOING  2.  Pt will verbalize/demo understanding of techniques to help with freezing episodes.  Baseline: does need reminders  Goal status: PARTIALLY MET  3.  Pt will improve TUG time to 16 seconds or less with LRAD vs. No AD with CGA and appropriate turning strategies in order to demo decrease fall risk.  Baseline: 21.22 seconds with min A   17.4 seconds with no AD with CGA (when pt focuses on turning techniques) Goal status: REVISED  4.   Pt will improve gait speed with LRAD to at least 2.0 ft/sec in order to demo improved community mobility.   Baseline: 22.44 seconds = 1.46 ft/sec with min A and no AD  On 2/6: 14.6 seconds with no AD with close CGA/min A = 2.25 ft/sec 17.1 seconds with RW= 1.91 ft/sec  Goal status:REVISED  5.  Pt will improve 360 deg turn to R to less than 7 seconds and 360 deg turn to L to less than 9 seconds in order to demo improve turning technique   Baseline:  360 degree turn to R: 8.1 seconds  360 degree turn to L: 12 seconds with min A due to incr anterior weight shift onto toes  Goal status: INITIAL   ASSESSMENT:  CLINICAL IMPRESSION: 10th visit PN: Today's skilled session focused on assessing LTGs. Pt did not meet LTG #3 in regards to FOG-Q, pt scored a 12/24 today on questionnaire (during eval was 12/24, indicating no change in freezing). Pt met LTGs #4 and #6. Pt did improve 5x sit <> stand to 11.8 seconds with no UE support (previously was 16.6 seconds with BLE bracing). Pt able to demo proper technique when cued for technique rather than just speed. Pt improved TUG on the 2nd attempt with no AD when focusing on deliberate strategies when turning, compared to first attempt with pt with significant freezing when turning. Pt did improve gait speed with no AD to 1.91 ft/sec (previously was 1.46 ft/sec), with pt with continued wide BOS, forward posture, and decr foot clearance with RLE. Pt with slower gait speed with use  of RW, but more stable. Gave PT recommendation of using RW  to decr fall risk, but pt reports he only uses it when coming in to therapy, otherwise does not use his AD. Will add 4 more appts to continue to address turning techniques and weight shifting. LTGs updated/revised as appropriate. Will continue per POC.    OBJECTIVE IMPAIRMENTS: Abnormal gait, decreased activity tolerance, decreased balance, decreased coordination, decreased knowledge of use of DME, decreased mobility, difficulty walking, decreased ROM, decreased strength, impaired flexibility, and postural dysfunction.   ACTIVITY LIMITATIONS: standing, squatting, transfers, bed mobility, and locomotion level  PARTICIPATION LIMITATIONS: community activity  PERSONAL FACTORS: Age, Behavior pattern, Past/current experiences, Time since onset of injury/illness/exacerbation, and 3+ comorbidities: bipolar disorder (1997), HLD, HTN, OSA on CPAP, reverse R shoulder arthroplastic 04/2022, L total hip replacement 2020.   are also affecting patient's functional outcome.   REHAB POTENTIAL: Good  CLINICAL DECISION MAKING: Evolving/moderate complexity  EVALUATION COMPLEXITY: Moderate  PLAN:  PT FREQUENCY: 2x/week  PT DURATION: 8 weeks  PLANNED INTERVENTIONS: 97164- PT Re-evaluation, 97110-Therapeutic exercises, 97530- Therapeutic activity, 97112- Neuromuscular re-education, 97535- Self Care, [id number]- Manual therapy, [phone]- Gait training, Patient/Family education, Balance training, Stair training, and DME instructions  PLAN FOR NEXT SESSION: Lateral weight shifting, turns. Work on freezing strategies. Work on gait with RW. Try SciFit at start of session. Rockerboard tasks, increased step clearance (RLE>LLE), blaze pods    Sheffield LOISE Senate, PT ,DPT 07/06/2023, 12:44 PM

## 2023-07-12 ENCOUNTER — Telehealth: Payer: Self-pay | Admitting: Family Medicine

## 2023-07-12 NOTE — Telephone Encounter (Signed)
Copied from CRM [id number]. Topic: Appointments - Scheduling Inquiry for Clinic >> Jul 11, 2023  4:23 PM Irine Seal wrote: Reason for CRM: patients PCP, Dr. Oneta Rack recently passed away. Patient  was recommended [facility] by a friend, Since Dr. Neva Seat is currently not accepting new patients, informed the him that I could not schedule his new patient appt- patient is wanting to know if Dr. Neva Seat will take him on as a new patient.  Callback [phone]

## 2023-07-13 ENCOUNTER — Ambulatory Visit: Payer: PPO | Admitting: Physical Therapy

## 2023-07-13 DIAGNOSIS — R2689 Other abnormalities of gait and mobility: Secondary | ICD-10-CM

## 2023-07-13 DIAGNOSIS — M6281 Muscle weakness (generalized): Secondary | ICD-10-CM

## 2023-07-13 DIAGNOSIS — R2681 Unsteadiness on feet: Secondary | ICD-10-CM

## 2023-07-13 NOTE — Telephone Encounter (Signed)
Pt was left a message letting him know a new provider will be here March 10 and he can call and schedule an appt with him around that time.

## 2023-07-13 NOTE — Therapy (Signed)
OUTPATIENT PHYSICAL THERAPY NEURO TREATMENT    Patient Name: Timothy [name] MRN: [id number] DOB:[birth year], 73 y.o., male Today's Date: 07/13/2023   PCP: [name], MD   REFERRING PROVIDER: Kateri Mc, MD    END OF SESSION:  PT End of Session - 07/13/23 1322     Visit Number 10   Progress note completed on visit 9   Number of Visits 13    Date for PT Re-Evaluation 08/06/23    Authorization Type [employer]    Progress Note Due on Visit --   performed on visit 9   PT Start Time 1319    PT Stop Time 1359    PT Time Calculation (min) 40 min    Equipment Utilized During Treatment Gait belt    Activity Tolerance Patient tolerated treatment well    Behavior During Therapy WFL for tasks assessed/performed              Past Medical History:  Diagnosis Date   Arthritis    Bipolar disorder (HCC)    controlled by meds since 1997   Cancer (HCC)    basal cell and pre melanoma on back   Neuromuscular disorder (HCC)    gait and ambulation problems, but no diagnosis from Neurology   Pneumonia    Sleep apnea    cpap   Wears glasses    reading   Past Surgical History:  Procedure Laterality Date   APPENDECTOMY     COLONOSCOPY     KNEE ARTHROSCOPY W/ MENISCAL REPAIR  2004   left   ORIF HUMERUS FRACTURE Left 09/19/2013   Procedure: LEFT OPEN REDUCTION INTERNAL FIXATION (ORIF) PROXIMAL HUMERUS; ROTATOR CUFF REPAIR;  Surgeon: Loreta Ave, MD;  Location: [facility];  Service: Orthopedics;  Laterality: Left;   REVERSE SHOULDER ARTHROPLASTY Right 05/27/2022   Procedure: REVERSE SHOULDER ARTHROPLASTY;  Surgeon: Yolonda Kida, MD;  Location: [facility];  Service: Orthopedics;  Laterality: Right;  120   TONSILLECTOMY     TOTAL HIP ARTHROPLASTY Left 07/18/2018   Procedure: TOTAL HIP ARTHROPLASTY ANTERIOR APPROACH;  Surgeon: Samson Frederic, MD;  Location: [facility];  Service: Orthopedics;  Laterality: Left;   Patient Active Problem  List   Diagnosis Date Noted   S/P reverse total shoulder arthroplasty, right 05/27/2022   Degenerative scoliosis in adult patient 03/06/2020   Bipolar I disorder (HCC) 12/03/2019   OSA on CPAP 11/22/2018   S/P total hip arthroplasty 07/19/2018   History of adenomatous polyp of colon 11/09/2017   Depression 07/21/2016   HTN (hypertension) 03/14/2016   Vitamin D deficiency 03/14/2016   Abnormal glucose 03/14/2016    ONSET DATE: 05/05/2023  REFERRING DIAG: R26.9 (ICD-10-CM) - Unspecified abnormalities of gait and mobility  THERAPY DIAG:  Unsteadiness on feet  Other abnormalities of gait and mobility  Muscle weakness (generalized)  Rationale for Evaluation and Treatment: Rehabilitation  SUBJECTIVE:  SUBJECTIVE STATEMENT: Pt reports no changes, no falls. Still only using RW at clinic.    Pt accompanied by: self  PERTINENT HISTORY: PMH: bipolar disorder (1997), HLD, HTN, OSA on CPAP, reverse R shoulder arthroplastic 04/2022, L total hip replacement 2020.   Per neurologist on 04/11/23: He was evaluated for gait impairment. He has a history of left hip replacement and left shoulder repair. He had EMG in 2022 with a chronic L5 radiculopathy on right lower extremity. MRI brain showed L frontal infarct and other smaller infarcts. MRI C and T spine was unremarkable. EMG 09/2021 then was normal. He saw Dr. Everlena Cooper at [facility] who wondered about PD. DAT scan 08/2022 was positive. He started Sinemet but did not noticed improvements.  Today he reports that he has had walking and balance problems since 2022. This started in the spring 2022. He wonders if the COVID booster which preceded his symptoms could be to blame. He underwent the workup as described above and it was thought that his gait impairments were  related to spinal disease. However his gait has continued to decline. While he can get around the house he has to use a cane sometimes and has come today in a wheelchair given the longer distance. He often limps and has to hold and grab his leg to pull it across to ambulate. He has had a few falls in the past year. He loses his balance if he is not careful and watching how he steps. He feels like his right leg is weaker. Turns are very challenging and when trying to multitask such as carrying something while walking, he will be at higher risk of falling. He admits when I describe freezing of gait that this is what he experiences especially with his right leg. He denies festinating gait. He has never completed PT for these problems since onset of symptoms.   PAIN:  Are you having pain? No   PRECAUTIONS: Fall  FALLS: Has patient fallen in last 6 months? Yes. Number of falls 0-1  LIVING ENVIRONMENT: Lives with: lives alone, has family that lives close by, a bunch of kids and grandkids  Lives in: House/apartment Stairs: No, reports stairs are a little easier with picking up feet  Has following equipment at home: Single point cane and Has a walker with no wheels   PLOF: Independent, Vocation/Vocational requirements: Works part time as a Clinical research associate , and Leisure: likes to smoke barbecue   PATIENT GOALS: Wants to improve his walking   OBJECTIVE:  Note: Objective measures were completed at Evaluation unless otherwise noted.  DIAGNOSTIC FINDINGS: DAT SCAN 08/2022: IMPRESSION: Moderate decreased relative radiotracer activity posterior LEFT putamen. Equivocal exam could indicate early Parkinsonian syndrome pathology.  MRI brain 04/29/21: IMPRESSION: Mildly motion degraded exam.   No evidence of acute intracranial abnormality.   Small chronic cortically-based infarct within the medial right parietal lobe.   Chronic lacunar infarct within the posterior left frontal lobe white matter. Minimal  background cerebral white matter chronic small vessel ischemic disease.   Mild-to-moderate generalized cerebral and cerebellar atrophy.  COGNITION: Overall cognitive status: Within functional limits for tasks assessed and Pt denies changes in thinking or memory.    SENSATION: WFL for light touch at BLE  COORDINATION: Heel to shin: Bradykinetic and harder to perform with RLE    MUSCLE TONE: Mild rigidity RLE     POSTURE: rounded shoulders, forward flexed posture in standing with gait   LOWER EXTREMITY ROM:    Pt with bilateral knee  flexion in standing    LOWER EXTREMITY MMT:    MMT Right Eval Left Eval  Hip flexion 4- 4  Hip extension    Hip abduction    Hip adduction    Hip internal rotation    Hip external rotation    Knee flexion 4+ 4+  Knee extension 4+ 5  Ankle dorsiflexion 3- 4  Ankle plantarflexion    Ankle inversion    Ankle eversion    (Blank rows = not tested)  BED MOBILITY:  Pt reports difficulty sometimes getting up due to R shoulder (hx of R shoulder surgery at end of 2023)  TRANSFERS: Assistive device utilized: None  Sit to stand: SBA and CGA Stand to sit: SBA and CGA  Performs with no UE support, decr anterior weight shift, pt with knees flexed in standing   Pt with initiation freezing in standing, before pt goes to walk.    FUNCTIONAL TESTS:  5 times sit to stand: 16.6 seconds with no UE support, bracing with BLE on chair  Timed up and go (TUG): 21.22 seconds with min A when turning and for freezing episodes during turns and straight away   10 meter walk test: 22.44 seconds = 1.46 ft/sec with min A and no AD  PATIENT SURVEYS:  Freezing of gait questionnaire 12/24 (higher score denotes more severe freezing of gait)                                                                                                                              TREATMENT DATE:   Self-care/home management  Lengthy discussion regarding rationale for using RW  and importance of using it at all times. Pt not using RW properly as he pushes AD too far anteriorly, resulting in increased freezing and festination. Pt reports he "does not need" RW at home, but informed pt he relies on furniture walking and catching himself on the wall to prevent fall, indicating that he needs an AD for safety. Pt in agreement with this. Pt reports he cannot take RW to work as he ascends stairs, but inquired about an elevator and pt states he could theoretically use the elevator.    NMR  Gait pattern: step through pattern, decreased step length- Right, decreased stride length, decreased ankle dorsiflexion- Right, decreased ankle dorsiflexion- Left, shuffling, festinating, decreased trunk rotation, trunk flexed, wide BOS, poor foot clearance- Right, and poor foot clearance- Left Distance walked: 115' x2, 230' plus various distances around clinic and outside  Assistive device utilized: Environmental consultant - 2 wheeled Level of assistance: CGA and Min A Comments: Practiced using RW around clinic and then escorted pt to car to practice on unlevel surfaces and through doorways. On initial 115' lap, pt required min A to drive RW as pt unable to coordinate steps and progressing walker, resulting in walking into walker. Max multimodal cues for upright posture and proper positioning inside RW. On second lap, pt able to manage RW more  independently but continued to require cues to stay close to RW rather than push RW too far forward. Practiced turning to L and R side, pt w/increased freezing when turning to R. Pt w/most freezing in narrow hallways, doorways and when turning to R side. Pt fatigued very quickly using RW, requiring seated rest break every 100-200' and frequent standing rest breaks.     PATIENT EDUCATION: Education details: See self-care section  Person educated: Patient Education method: Explanation, Demonstration, Verbal cues, and Handouts Education comprehension: verbalized understanding,  returned demonstration, verbal cues required, and needs further education  HOME EXERCISE PROGRAM: Access Code: [email or web address] URL: [email or web address] Date: 07/06/2023 Prepared by: Sherlie Ban  Exercises - Standing Quarter Turn with Counter Support  - 1-2 x daily - 7 x weekly - 3 sets - 10 reps - Side to Side Weight Shift with Counter Support  - 1-2 x daily - 7 x weekly - 1-2 sets - 10 reps - Side Stepping with Counter Support  - 1-2 x daily - 7 x weekly - 1-2 sets - 10 reps - Standing Single Leg Stance with Counter Support  - 1-2 x daily - 7 x weekly - 3 sets - 10-15 hold  GOALS: Goals reviewed with patient? Yes  SHORT TERM GOALS: ALL STGS = LTGS  LONG TERM GOALS: Target date: 07/05/2023  Pt will be independent with final HEP for gait/balance in order to build upon functional gains made in therapy. Baseline:  Goal status: ON-GOING  2.  Pt will verbalize/demo understanding of techniques to help with freezing episodes.  Baseline: does need reminders  Goal status: PARTIALLY MET  3.  Pt will decr FOGQ to 9/24 or less in order to demo improved freezing.  Baseline: 12/24   12/24 on 2/6 Goal status: NOT MET   4.  Pt will improve TUG time to 17 seconds or less with LRAD vs. No AD with CGA in order to demo decrease fall risk.  Baseline: 21.22 seconds with min A   17.4 seconds with no AD with CGA (when pt focuses on turning techniques) Goal status: MET  5.  Pt will improve gait speed with LRAD to at least 1.8 ft/sec in order to demo improved community mobility.   Baseline: 22.44 seconds = 1.46 ft/sec with min A and no AD  On 2/6: 14.6 seconds with no AD with close CGA/min A = 2.25 ft/sec 17.1 seconds with RW= 1.91 ft/sec  Goal status: PARTIALLY MET  6.  Pt will improve 5x sit<>stand to less than or equal to 14 sec to demonstrate improved functional strength and transfer efficiency.   Baseline: 16.6 seconds with no UE support, bracing with BLE on chair   11.8  seconds with no UE support, no BLE bracing against mat table  Goal status: MET  LONG TERM GOALS: Target date: 08/03/2023 UPDATED LTG DATE FOR POC  Pt will be independent with final HEP for gait/balance in order to build upon functional gains made in therapy. Baseline:  Goal status: ON-GOING  2.  Pt will verbalize/demo understanding of techniques to help with freezing episodes.  Baseline: does need reminders  Goal status: PARTIALLY MET  3.  Pt will improve TUG time to 16 seconds or less with LRAD vs. No AD with CGA and appropriate turning strategies in order to demo decrease fall risk.  Baseline: 21.22 seconds with min A   17.4 seconds with no AD with CGA (when pt focuses on turning techniques) Goal status: REVISED  4.   Pt  will improve gait speed with LRAD to at least 2.0 ft/sec in order to demo improved community mobility.   Baseline: 22.44 seconds = 1.46 ft/sec with min A and no AD  On 2/6: 14.6 seconds with no AD with close CGA/min A = 2.25 ft/sec 17.1 seconds with RW= 1.91 ft/sec  Goal status:REVISED  5.  Pt will improve 360 deg turn to R to less than 7 seconds and 360 deg turn to L to less than 9 seconds in order to demo improve turning technique   Baseline:  360 degree turn to R: 8.1 seconds  360 degree turn to L: 12 seconds with min A due to incr anterior weight shift onto toes  Goal status: INITIAL   ASSESSMENT:  CLINICAL IMPRESSION: Emphasis of skilled PT session on gait training w/RW and pt education on safety w/ambulation. Pt reports he does not use RW any time other than when he comes to PT because he "does not need it", but reminded pt of his furniture walking and need to catch himself to prevent fall. Pt in agreement that he is not safe w/current nonuse of AD and requested to spend session practicing w/RW. Pt initially required min A to properly progress RW and is limited by significant freezing and poor postural control. Pt very fatigued w/use of RW, requiring  several rest breaks. Will continue per POC.    OBJECTIVE IMPAIRMENTS: Abnormal gait, decreased activity tolerance, decreased balance, decreased coordination, decreased knowledge of use of DME, decreased mobility, difficulty walking, decreased ROM, decreased strength, impaired flexibility, and postural dysfunction.   ACTIVITY LIMITATIONS: standing, squatting, transfers, bed mobility, and locomotion level  PARTICIPATION LIMITATIONS: community activity  PERSONAL FACTORS: Age, Behavior pattern, Past/current experiences, Time since onset of injury/illness/exacerbation, and 3+ comorbidities: bipolar disorder (1997), HLD, HTN, OSA on CPAP, reverse R shoulder arthroplastic 04/2022, L total hip replacement 2020.   are also affecting patient's functional outcome.   REHAB POTENTIAL: Good  CLINICAL DECISION MAKING: Evolving/moderate complexity  EVALUATION COMPLEXITY: Moderate  PLAN:  PT FREQUENCY: 2x/week  PT DURATION: 8 weeks  PLANNED INTERVENTIONS: 97164- PT Re-evaluation, 97110-Therapeutic exercises, 97530- Therapeutic activity, 97112- Neuromuscular re-education, 97535- Self Care, 20254- Manual therapy, [phone]- Gait training, Patient/Family education, Balance training, Stair training, and DME instructions  PLAN FOR NEXT SESSION: Continue practicing with RW. Lateral weight shifting, turns. Work on freezing strategies. Try SciFit at start of session. Rockerboard tasks, increased step clearance (RLE>LLE), blaze pods    Quaniya Damas E Cariana Karge, PT ,DPT 07/13/2023, 2:04 PM

## 2023-07-20 ENCOUNTER — Ambulatory Visit: Payer: PPO | Admitting: Physical Therapy

## 2023-07-20 DIAGNOSIS — R2689 Other abnormalities of gait and mobility: Secondary | ICD-10-CM

## 2023-07-20 DIAGNOSIS — R2681 Unsteadiness on feet: Secondary | ICD-10-CM | POA: Diagnosis not present

## 2023-07-20 DIAGNOSIS — M6281 Muscle weakness (generalized): Secondary | ICD-10-CM

## 2023-07-20 NOTE — Therapy (Signed)
 OUTPATIENT PHYSICAL THERAPY NEURO TREATMENT    Patient Name: Timothy [name] MRN: [id number] DOB:[birth year], 73 y.o., male Today's Date: 07/20/2023   PCP: [name], MD   REFERRING PROVIDER: Kateri Mc, MD    END OF SESSION:  PT End of Session - 07/20/23 1314     Visit Number 11    Number of Visits 13    Date for PT Re-Evaluation 08/06/23    Authorization Type [employer]    Progress Note Due on Visit --   performed on visit 9   PT Start Time 1312    PT Stop Time 1352    PT Time Calculation (min) 40 min    Equipment Utilized During Treatment Gait belt    Activity Tolerance Patient tolerated treatment well    Behavior During Therapy WFL for tasks assessed/performed               Past Medical History:  Diagnosis Date   Arthritis    Bipolar disorder (HCC)    controlled by meds since 1997   Cancer (HCC)    basal cell and pre melanoma on back   Neuromuscular disorder (HCC)    gait and ambulation problems, but no diagnosis from Neurology   Pneumonia    Sleep apnea    cpap   Wears glasses    reading   Past Surgical History:  Procedure Laterality Date   APPENDECTOMY     COLONOSCOPY     KNEE ARTHROSCOPY W/ MENISCAL REPAIR  2004   left   ORIF HUMERUS FRACTURE Left 09/19/2013   Procedure: LEFT OPEN REDUCTION INTERNAL FIXATION (ORIF) PROXIMAL HUMERUS; ROTATOR CUFF REPAIR;  Surgeon: Loreta Ave, MD;  Location: [facility];  Service: Orthopedics;  Laterality: Left;   REVERSE SHOULDER ARTHROPLASTY Right 05/27/2022   Procedure: REVERSE SHOULDER ARTHROPLASTY;  Surgeon: Yolonda Kida, MD;  Location: [facility];  Service: Orthopedics;  Laterality: Right;  120   TONSILLECTOMY     TOTAL HIP ARTHROPLASTY Left 07/18/2018   Procedure: TOTAL HIP ARTHROPLASTY ANTERIOR APPROACH;  Surgeon: Samson Frederic, MD;  Location: [facility];  Service: Orthopedics;  Laterality: Left;   Patient Active Problem List   Diagnosis Date Noted    S/P reverse total shoulder arthroplasty, right 05/27/2022   Degenerative scoliosis in adult patient 03/06/2020   Bipolar I disorder (HCC) 12/03/2019   OSA on CPAP 11/22/2018   S/P total hip arthroplasty 07/19/2018   History of adenomatous polyp of colon 11/09/2017   Depression 07/21/2016   HTN (hypertension) 03/14/2016   Vitamin D deficiency 03/14/2016   Abnormal glucose 03/14/2016    ONSET DATE: 05/05/2023  REFERRING DIAG: R26.9 (ICD-10-CM) - Unspecified abnormalities of gait and mobility  THERAPY DIAG:  Unsteadiness on feet  Other abnormalities of gait and mobility  Muscle weakness (generalized)  Rationale for Evaluation and Treatment: Rehabilitation  SUBJECTIVE:  SUBJECTIVE STATEMENT: Pt reports he has been using RW regularly, even took it to work. Only issue is carrying his mug of coffee from the kitchen when using the RW. No falls   Pt accompanied by: self  PERTINENT HISTORY: PMH: bipolar disorder (1997), HLD, HTN, OSA on CPAP, reverse R shoulder arthroplastic 04/2022, L total hip replacement 2020.   Per neurologist on 04/11/23: He was evaluated for gait impairment. He has a history of left hip replacement and left shoulder repair. He had EMG in 2022 with a chronic L5 radiculopathy on right lower extremity. MRI brain showed L frontal infarct and other smaller infarcts. MRI C and T spine was unremarkable. EMG 09/2021 then was normal. He saw Dr. Everlena Cooper at [facility] who wondered about PD. DAT scan 08/2022 was positive. He started Sinemet but did not noticed improvements.  Today he reports that he has had walking and balance problems since 2022. This started in the spring 2022. He wonders if the COVID booster which preceded his symptoms could be to blame. He underwent the workup as described above  and it was thought that his gait impairments were related to spinal disease. However his gait has continued to decline. While he can get around the house he has to use a cane sometimes and has come today in a wheelchair given the longer distance. He often limps and has to hold and grab his leg to pull it across to ambulate. He has had a few falls in the past year. He loses his balance if he is not careful and watching how he steps. He feels like his right leg is weaker. Turns are very challenging and when trying to multitask such as carrying something while walking, he will be at higher risk of falling. He admits when I describe freezing of gait that this is what he experiences especially with his right leg. He denies festinating gait. He has never completed PT for these problems since onset of symptoms.   PAIN:  Are you having pain? No   PRECAUTIONS: Fall  FALLS: Has patient fallen in last 6 months? Yes. Number of falls 0-1  LIVING ENVIRONMENT: Lives with: lives alone, has family that lives close by, a bunch of kids and grandkids  Lives in: House/apartment Stairs: No, reports stairs are a little easier with picking up feet  Has following equipment at home: Single point cane and Has a walker with no wheels   PLOF: Independent, Vocation/Vocational requirements: Works part time as a Clinical research associate , and Leisure: likes to smoke barbecue   PATIENT GOALS: Wants to improve his walking   OBJECTIVE:  Note: Objective measures were completed at Evaluation unless otherwise noted.  DIAGNOSTIC FINDINGS: DAT SCAN 08/2022: IMPRESSION: Moderate decreased relative radiotracer activity posterior LEFT putamen. Equivocal exam could indicate early Parkinsonian syndrome pathology.  MRI brain 04/29/21: IMPRESSION: Mildly motion degraded exam.   No evidence of acute intracranial abnormality.   Small chronic cortically-based infarct within the medial right parietal lobe.   Chronic lacunar infarct within the  posterior left frontal lobe white matter. Minimal background cerebral white matter chronic small vessel ischemic disease.   Mild-to-moderate generalized cerebral and cerebellar atrophy.  COGNITION: Overall cognitive status: Within functional limits for tasks assessed and Pt denies changes in thinking or memory.    SENSATION: WFL for light touch at BLE  COORDINATION: Heel to shin: Bradykinetic and harder to perform with RLE    MUSCLE TONE: Mild rigidity RLE     POSTURE: rounded shoulders, forward flexed  posture in standing with gait   LOWER EXTREMITY ROM:    Pt with bilateral knee flexion in standing    LOWER EXTREMITY MMT:    MMT Right Eval Left Eval  Hip flexion 4- 4  Hip extension    Hip abduction    Hip adduction    Hip internal rotation    Hip external rotation    Knee flexion 4+ 4+  Knee extension 4+ 5  Ankle dorsiflexion 3- 4  Ankle plantarflexion    Ankle inversion    Ankle eversion    (Blank rows = not tested)  BED MOBILITY:  Pt reports difficulty sometimes getting up due to R shoulder (hx of R shoulder surgery at end of 2023)  TRANSFERS: Assistive device utilized: None  Sit to stand: SBA and CGA Stand to sit: SBA and CGA  Performs with no UE support, decr anterior weight shift, pt with knees flexed in standing   Pt with initiation freezing in standing, before pt goes to walk.    FUNCTIONAL TESTS:  5 times sit to stand: 16.6 seconds with no UE support, bracing with BLE on chair  Timed up and go (TUG): 21.22 seconds with min A when turning and for freezing episodes during turns and straight away   10 meter walk test: 22.44 seconds = 1.46 ft/sec with min A and no AD  PATIENT SURVEYS:  Freezing of gait questionnaire 12/24 (higher score denotes more severe freezing of gait)                                                                                                                              TREATMENT DATE:   Self-care/home management   Showed pt cup holders for RW from [employer] to assist w/carrying coffee cup. Pt verbalized understanding   Ther Act  SciFit multi-peaks level 11 for 8 minutes using BUE/BLEs for neural priming for reciprocal movement, dynamic cardiovascular warmup and increased amplitude of stepping. Noted increased lateral lean to L side this date.    NMR  Gait pattern: step through pattern, decreased step length- Right, decreased stride length, decreased ankle dorsiflexion- Right, decreased ankle dorsiflexion- Left, shuffling, festinating, decreased trunk rotation, trunk flexed, wide BOS, poor foot clearance- Right, and poor foot clearance- Left Distance walked: Various clinic distances   Assistive device utilized: Walker - 2 wheeled Level of assistance: CGA Comments: Obstacle course navigation x3 using cones on straightaway to work on turns, proper AD management and lateral weight shifting. Pt required seated rest break in between trials due to fatigue of BUEs. Noted pt pushing RW too far fwd and leaving RLE outside of RW, resulting in either tripping over R leg of RW or facilitating increased freezing. Mod multimodal cues to stay inside RW and "stop and reset" if he begins leaning too far forward and freezing. Pt received education well and was able to demonstrate on final attempt. Pt reported decrease in UE fatigue w/proper body mechanics.  RAMP:  Level of Assistance: CGA Assistive device utilized: Walker - 2 wheeled Ramp Comments: Increased difficulty w/ascending ramp due to significant forward lean, decreased step clearance and freezing. Min cues to maintain closer distance to RW   CURB:  Level of Assistance: Modified independence Assistive device utilized: Environmental consultant - 2 wheeled Curb Comments: Pt demonstrated good AD management and sequencing w/RW    PATIENT EDUCATION: Education details: Continue HEP, where to purchase cup holder for [employer]  Person educated: Patient Education method: Programmer, multimedia,  Demonstration, Verbal cues, and Handouts Education comprehension: verbalized understanding, returned demonstration, verbal cues required, and needs further education  HOME EXERCISE PROGRAM: Access Code: [email or web address] URL: [email or web address] Date: 07/06/2023 Prepared by: Sherlie Ban  Exercises - Standing Quarter Turn with Counter Support  - 1-2 x daily - 7 x weekly - 3 sets - 10 reps - Side to Side Weight Shift with Counter Support  - 1-2 x daily - 7 x weekly - 1-2 sets - 10 reps - Side Stepping with Counter Support  - 1-2 x daily - 7 x weekly - 1-2 sets - 10 reps - Standing Single Leg Stance with Counter Support  - 1-2 x daily - 7 x weekly - 3 sets - 10-15 hold  GOALS: Goals reviewed with patient? Yes  SHORT TERM GOALS: ALL STGS = LTGS  LONG TERM GOALS: Target date: 07/05/2023  Pt will be independent with final HEP for gait/balance in order to build upon functional gains made in therapy. Baseline:  Goal status: ON-GOING  2.  Pt will verbalize/demo understanding of techniques to help with freezing episodes.  Baseline: does need reminders  Goal status: PARTIALLY MET  3.  Pt will decr FOGQ to 9/24 or less in order to demo improved freezing.  Baseline: 12/24   12/24 on 2/6 Goal status: NOT MET   4.  Pt will improve TUG time to 17 seconds or less with LRAD vs. No AD with CGA in order to demo decrease fall risk.  Baseline: 21.22 seconds with min A   17.4 seconds with no AD with CGA (when pt focuses on turning techniques) Goal status: MET  5.  Pt will improve gait speed with LRAD to at least 1.8 ft/sec in order to demo improved community mobility.   Baseline: 22.44 seconds = 1.46 ft/sec with min A and no AD  On 2/6: 14.6 seconds with no AD with close CGA/min A = 2.25 ft/sec 17.1 seconds with RW= 1.91 ft/sec  Goal status: PARTIALLY MET  6.  Pt will improve 5x sit<>stand to less than or equal to 14 sec to demonstrate improved functional strength and transfer  efficiency.   Baseline: 16.6 seconds with no UE support, bracing with BLE on chair   11.8 seconds with no UE support, no BLE bracing against mat table  Goal status: MET  LONG TERM GOALS: Target date: 08/03/2023 UPDATED LTG DATE FOR POC  Pt will be independent with final HEP for gait/balance in order to build upon functional gains made in therapy. Baseline:  Goal status: ON-GOING  2.  Pt will verbalize/demo understanding of techniques to help with freezing episodes.  Baseline: does need reminders  Goal status: PARTIALLY MET  3.  Pt will improve TUG time to 16 seconds or less with LRAD vs. No AD with CGA and appropriate turning strategies in order to demo decrease fall risk.  Baseline: 21.22 seconds with min A   17.4 seconds with no AD with CGA (when pt focuses on turning techniques) Goal status:  REVISED  4.   Pt will improve gait speed with LRAD to at least 2.0 ft/sec in order to demo improved community mobility.   Baseline: 22.44 seconds = 1.46 ft/sec with min A and no AD  On 2/6: 14.6 seconds with no AD with close CGA/min A = 2.25 ft/sec 17.1 seconds with RW= 1.91 ft/sec  Goal status:REVISED  5.  Pt will improve 360 deg turn to R to less than 7 seconds and 360 deg turn to L to less than 9 seconds in order to demo improve turning technique   Baseline:  360 degree turn to R: 8.1 seconds  360 degree turn to L: 12 seconds with min A due to incr anterior weight shift onto toes  Goal status: INITIAL   ASSESSMENT:  CLINICAL IMPRESSION: Emphasis of skilled PT session on continued practice w/ proper RW management in community, sequencing of turns and freezing techniques. Pt reports compliance w/use of RW in community and states it has been helpful at work. Pt still struggling w/sequencing turns and navigating tight spaces due to freezing, but demonstrated good ability to "stop and reset" without cues this date. Pt did demonstrate lateral lean to L when seated this date and continues  to drag RLE behind him when fatigued, but overall is ambulating much more efficiently w/RW. Will continue per POC.    OBJECTIVE IMPAIRMENTS: Abnormal gait, decreased activity tolerance, decreased balance, decreased coordination, decreased knowledge of use of DME, decreased mobility, difficulty walking, decreased ROM, decreased strength, impaired flexibility, and postural dysfunction.   ACTIVITY LIMITATIONS: standing, squatting, transfers, bed mobility, and locomotion level  PARTICIPATION LIMITATIONS: community activity  PERSONAL FACTORS: Age, Behavior pattern, Past/current experiences, Time since onset of injury/illness/exacerbation, and 3+ comorbidities: bipolar disorder (1997), HLD, HTN, OSA on CPAP, reverse R shoulder arthroplastic 04/2022, L total hip replacement 2020.   are also affecting patient's functional outcome.   REHAB POTENTIAL: Good  CLINICAL DECISION MAKING: Evolving/moderate complexity  EVALUATION COMPLEXITY: Moderate  PLAN:  PT FREQUENCY: 2x/week  PT DURATION: 8 weeks  PLANNED INTERVENTIONS: 97164- PT Re-evaluation, 97110-Therapeutic exercises, 97530- Therapeutic activity, 97112- Neuromuscular re-education, 97535- Self Care, [id number]- Manual therapy, [phone]- Gait training, Patient/Family education, Balance training, Stair training, and DME instructions  PLAN FOR NEXT SESSION: Continue practicing with RW. Lateral weight shifting, turns. Work on freezing strategies. Try SciFit at start of session. Rockerboard tasks, increased step clearance (RLE>LLE), blaze pods    Manuelita Moxon E Shavanna Furnari, PT ,DPT 07/20/2023, 1:52 PM

## 2023-07-22 ENCOUNTER — Encounter (HOSPITAL_BASED_OUTPATIENT_CLINIC_OR_DEPARTMENT_OTHER): Payer: Self-pay | Admitting: Emergency Medicine

## 2023-07-22 ENCOUNTER — Emergency Department (HOSPITAL_BASED_OUTPATIENT_CLINIC_OR_DEPARTMENT_OTHER)
Admission: EM | Admit: 2023-07-22 | Discharge: 2023-07-22 | Disposition: A | Discharge status: Home / Self Care | Payer: PPO | Attending: Emergency Medicine | Admitting: Emergency Medicine

## 2023-07-22 DIAGNOSIS — Z7982 Long term (current) use of aspirin: Secondary | ICD-10-CM | POA: Diagnosis not present

## 2023-07-22 DIAGNOSIS — Z79899 Other long term (current) drug therapy: Secondary | ICD-10-CM | POA: Insufficient documentation

## 2023-07-22 DIAGNOSIS — H5710 Ocular pain, unspecified eye: Secondary | ICD-10-CM | POA: Diagnosis present

## 2023-07-22 DIAGNOSIS — H00014 Hordeolum externum left upper eyelid: Secondary | ICD-10-CM | POA: Diagnosis not present

## 2023-07-22 MED ORDER — ERYTHROMYCIN 5 MG/GM OP OINT: TOPICAL_OINTMENT | OPHTHALMIC | 0 refills | Status: DC

## 2023-07-22 NOTE — Discharge Instructions (Addendum)
 I would wait a few days of just using the warm compresses like I said this is normally inflammatory not infectious, however if you would like to you can start using the antibacterial ointment that I prescribed up to 4 times daily to the affected site.  If you are not having significant relief in 2 weeks I recommend following up with an ophthalmologist to discuss additional treatment.  You can apply warm compresses to the affected site at least 4 times daily.

## 2023-07-22 NOTE — ED Triage Notes (Signed)
 swelling of eye lid left eye X a few days Denies vision issues

## 2023-07-22 NOTE — ED Provider Notes (Signed)
  EMERGENCY DEPARTMENT AT [facility] Provider Note   CSN: [id number] Arrival date & time: 07/22/23  1548     History  Chief Complaint  Patient presents with   Eye Pain    Timothy [name] is a 73 y.o. male with overall noncontributory past medical history presents with concern for swelling of left upper eyelid for 1 day.  He does endorse previous history of stye but he reports it has been several years since he had 1.  He is not sure how they are treated in the past.  He denies any vision changes, feeling of foreign body in the eye.   Eye Pain       Home Medications Prior to Admission medications   Medication Sig Start Date End Date Taking? Authorizing Provider  erythromycin ophthalmic ointment Place a 1/2 inch ribbon of ointment into the lower eyelid. 07/22/23  Yes Chrissy Ealey H, PA-C  aspirin EC 81 MG tablet Take 81 mg by mouth daily. Swallow whole.    [provider]  bisoprolol-hydrochlorothiazide [facility]) 5-6.25 MG tablet TAKE 1 TABLET BY MOUTH EVERY DAY FOR BLOOD PRESSURE 01/05/23   Raynelle Dick, NP  carbidopa-levodopa (SINEMET IR) 25-100 MG tablet TAKE 1 TABLET BY MOUTH THREE TIMES A DAY 06/07/23   Everlena Cooper, Adam R, DO  Cholecalciferol (VITAMIN D-3) 125 MCG (5000 UT) TABS Take 5,000 Units by mouth every evening.     [provider]  Cyanocobalamin (VITAMIN B-12) 1000 MCG SUBL Take 1 tablet SL Daily Patient taking differently: Take 1,000 mcg by mouth daily. 03/21/22   [name], MD  divalproex (DEPAKOTE) 250 MG DR tablet Take 2 tablets (500 mg total) by mouth 2 (two) times daily. 10/20/22   Cottle, Steva Ready., MD  meclizine (ANTIVERT) 25 MG tablet Take 1/2 to 1 tablet 2 to 3 x /day as needed for Dizziness / Vertigo 09/26/22   [name], MD      Allergies    Ciprofloxacin    Review of Systems   Review of Systems  Eyes:  Positive for pain.  All other systems reviewed and are negative.   Physical Exam Updated Vital  Signs BP 132/69   Pulse 63   Temp 98 F (36.7 C)   Resp 16   SpO2 97%  Physical Exam Vitals and nursing note reviewed.  Constitutional:      General: He is not in acute distress.    Appearance: Normal appearance.  HENT:     Head: Normocephalic and atraumatic.  Eyes:     General:        Right eye: No discharge.        Left eye: No discharge.     Comments: Patient with some crusting around the left eye, he has a palpable stye on the left upper eyelid without any expression of fluid at this time.  The sclera on the left is not erythematous, pupils equal round reactive to light, EOMs are intact throughout.  No significant periorbital swelling.  No proptosis.  Cardiovascular:     Rate and Rhythm: Normal rate and regular rhythm.  Pulmonary:     Effort: Pulmonary effort is normal. No respiratory distress.  Musculoskeletal:        General: No deformity.  Skin:    General: Skin is warm and dry.  Neurological:     Mental Status: He is alert and oriented to person, place, and time.  Psychiatric:        Mood and Affect:  Mood normal.        Behavior: Behavior normal.     ED Results / Procedures / Treatments   Labs (all labs ordered are listed, but only abnormal results are displayed) Labs Reviewed - No data to display  EKG None  Radiology No results found.  Procedures Procedures    Medications Ordered in ED Medications - No data to display  ED Course/ Medical Decision Making/ A&P                                 Medical Decision Making  This patient is a 73 y.o. male who presents to the ED for concern of left eyelid swelling  Differential diagnoses prior to evaluation: Stye, hordeolum, conjunctivitis, periorbital cellulitis, orbital cellulitis, versus other  Past Medical History / Social History / Additional history: Chart reviewed. Pertinent results include: Overall noncontributory  Physical Exam: Physical exam performed. The pertinent findings include: Visible  and palpable left upper eyelid stye, no evidence of proptosis, EOMs intact, pupils are equal round reactive to light bilaterally.  No scleral redness, irritation.  No signs stable in emergency department  Medications / Treatment: Encouraged warm compresses, time, prescription for Romycin abdominal ointment provided, but discussed that this is not personally treatment, only to use if he is having more eye irritation, encourage close ophthalmology follow-up   Disposition: After consideration of the diagnostic results and the patients response to treatment, I feel that patient stable for discharge at this time, findings consistent with stye.   emergency department workup does not suggest an emergent condition requiring admission or immediate intervention beyond what has been performed at this time. The plan is: as above. The patient is safe for discharge and has been instructed to return immediately for worsening symptoms, change in symptoms or any other concerns.  Final Clinical Impression(s) / ED Diagnoses Final diagnoses:  Hordeolum externum of left upper eyelid    Rx / DC Orders ED Discharge Orders          Ordered    erythromycin ophthalmic ointment        07/22/23 1645              Jaquari Reckner, Reagan H, PA-C 07/22/23 1649    Kaelen, Caughlin, DO 07/23/23 1223

## 2023-07-22 NOTE — ED Notes (Signed)

## 2023-07-27 ENCOUNTER — Telehealth: Payer: Self-pay | Admitting: Physical Therapy

## 2023-07-27 ENCOUNTER — Ambulatory Visit: Payer: PPO | Admitting: Physical Therapy

## 2023-07-27 DIAGNOSIS — R2681 Unsteadiness on feet: Secondary | ICD-10-CM | POA: Diagnosis not present

## 2023-07-27 DIAGNOSIS — R2689 Other abnormalities of gait and mobility: Secondary | ICD-10-CM

## 2023-07-27 DIAGNOSIS — M6281 Muscle weakness (generalized): Secondary | ICD-10-CM

## 2023-07-27 NOTE — Telephone Encounter (Signed)
 Called pt and left VM regarding no-show to today's appointment. Informed pt of next appointment date and time and provided clinic's number to call if he needs to cancel/reschedule.   Jill Alexanders Horace Lukas, PT, DPT

## 2023-07-27 NOTE — Therapy (Signed)
 OUTPATIENT PHYSICAL THERAPY NEURO TREATMENT - DISCHARGE SUMMARY    Patient Name: Timothy [name] MRN: [id number] DOB:1950-11-14, 73 y.o., male Today's Date: 07/27/2023   PCP: [name], MD   REFERRING PROVIDER: Kateri Mc, MD  PHYSICAL THERAPY DISCHARGE SUMMARY  Visits from Start of Care: 12  Current functional level related to goals / functional outcomes: Pt is mod I w/gait and ADLs w/use of RW     Remaining deficits: Significant freezing of gait, postural deficits, shuffling gait and high fall risk    Education / Equipment: HEP, Dr. Don Perking contact information    Patient agrees to discharge. Patient goals were partially met. Patient is being discharged due to the patient's request.     END OF SESSION:  PT End of Session - 07/27/23 1451     Visit Number 12    Number of Visits 13    Date for PT Re-Evaluation 08/06/23    Authorization Type [employer]    Progress Note Due on Visit --   performed on visit 9   PT Start Time 1447    PT Stop Time 1510   DC   PT Time Calculation (min) 23 min    Equipment Utilized During Treatment Gait belt    Activity Tolerance Patient tolerated treatment well    Behavior During Therapy WFL for tasks assessed/performed               Past Medical History:  Diagnosis Date   Arthritis    Bipolar disorder (HCC)    controlled by meds since 1997   Cancer (HCC)    basal cell and pre melanoma on back   Neuromuscular disorder (HCC)    gait and ambulation problems, but no diagnosis from Neurology   Pneumonia    Sleep apnea    cpap   Wears glasses    reading   Past Surgical History:  Procedure Laterality Date   APPENDECTOMY     COLONOSCOPY     KNEE ARTHROSCOPY W/ MENISCAL REPAIR  2004   left   ORIF HUMERUS FRACTURE Left 09/19/2013   Procedure: LEFT OPEN REDUCTION INTERNAL FIXATION (ORIF) PROXIMAL HUMERUS; ROTATOR CUFF REPAIR;  Surgeon: Loreta Ave, MD;  Location: [facility];   Service: Orthopedics;  Laterality: Left;   REVERSE SHOULDER ARTHROPLASTY Right 05/27/2022   Procedure: REVERSE SHOULDER ARTHROPLASTY;  Surgeon: Yolonda Kida, MD;  Location: [facility];  Service: Orthopedics;  Laterality: Right;  120   TONSILLECTOMY     TOTAL HIP ARTHROPLASTY Left 07/18/2018   Procedure: TOTAL HIP ARTHROPLASTY ANTERIOR APPROACH;  Surgeon: Samson Frederic, MD;  Location: [facility];  Service: Orthopedics;  Laterality: Left;   Patient Active Problem List   Diagnosis Date Noted   S/P reverse total shoulder arthroplasty, right 05/27/2022   Degenerative scoliosis in adult patient 03/06/2020   Bipolar I disorder (HCC) 12/03/2019   OSA on CPAP 11/22/2018   S/P total hip arthroplasty 07/19/2018   History of adenomatous polyp of colon 11/09/2017   Depression 07/21/2016   HTN (hypertension) 03/14/2016   Vitamin D deficiency 03/14/2016   Abnormal glucose 03/14/2016    ONSET DATE: 05/05/2023  REFERRING DIAG: R26.9 (ICD-10-CM) - Unspecified abnormalities of gait and mobility  THERAPY DIAG:  Unsteadiness on feet  Other abnormalities of gait and mobility  Muscle weakness (generalized)  Rationale for Evaluation and Treatment: Rehabilitation  SUBJECTIVE:  SUBJECTIVE STATEMENT: Pt presents w/RW. States he is "getting nothing out of this" and would like to DC today. No falls.   Pt accompanied by: self  PERTINENT HISTORY: PMH: bipolar disorder (1997), HLD, HTN, OSA on CPAP, reverse R shoulder arthroplastic 04/2022, L total hip replacement 2020.   Per neurologist on 04/11/23: He was evaluated for gait impairment. He has a history of left hip replacement and left shoulder repair. He had EMG in 2022 with a chronic L5 radiculopathy on right lower extremity. MRI brain showed L frontal infarct and  other smaller infarcts. MRI C and T spine was unremarkable. EMG 09/2021 then was normal. He saw Dr. Everlena Cooper at [facility] who wondered about PD. DAT scan 08/2022 was positive. He started Sinemet but did not noticed improvements.  Today he reports that he has had walking and balance problems since 2022. This started in the spring 2022. He wonders if the COVID booster which preceded his symptoms could be to blame. He underwent the workup as described above and it was thought that his gait impairments were related to spinal disease. However his gait has continued to decline. While he can get around the house he has to use a cane sometimes and has come today in a wheelchair given the longer distance. He often limps and has to hold and grab his leg to pull it across to ambulate. He has had a few falls in the past year. He loses his balance if he is not careful and watching how he steps. He feels like his right leg is weaker. Turns are very challenging and when trying to multitask such as carrying something while walking, he will be at higher risk of falling. He admits when I describe freezing of gait that this is what he experiences especially with his right leg. He denies festinating gait. He has never completed PT for these problems since onset of symptoms.   PAIN:  Are you having pain? No   PRECAUTIONS: Fall  FALLS: Has patient fallen in last 6 months? Yes. Number of falls 0-1  LIVING ENVIRONMENT: Lives with: lives alone, has family that lives close by, a bunch of kids and grandkids  Lives in: House/apartment Stairs: No, reports stairs are a little easier with picking up feet  Has following equipment at home: Single point cane and Has a walker with no wheels   PLOF: Independent, Vocation/Vocational requirements: Works part time as a Clinical research associate , and Leisure: likes to smoke barbecue   PATIENT GOALS: Wants to improve his walking   OBJECTIVE:  Note: Objective measures were completed at Evaluation unless  otherwise noted.  DIAGNOSTIC FINDINGS: DAT SCAN 08/2022: IMPRESSION: Moderate decreased relative radiotracer activity posterior LEFT putamen. Equivocal exam could indicate early Parkinsonian syndrome pathology.  MRI brain 04/29/21: IMPRESSION: Mildly motion degraded exam.   No evidence of acute intracranial abnormality.   Small chronic cortically-based infarct within the medial right parietal lobe.   Chronic lacunar infarct within the posterior left frontal lobe white matter. Minimal background cerebral white matter chronic small vessel ischemic disease.   Mild-to-moderate generalized cerebral and cerebellar atrophy.  COGNITION: Overall cognitive status: Within functional limits for tasks assessed and Pt denies changes in thinking or memory.    SENSATION: WFL for light touch at BLE  COORDINATION: Heel to shin: Bradykinetic and harder to perform with RLE    MUSCLE TONE: Mild rigidity RLE     POSTURE: rounded shoulders, forward flexed posture in standing with gait   LOWER EXTREMITY ROM:  Pt with bilateral knee flexion in standing    LOWER EXTREMITY MMT:    MMT Right Eval Left Eval  Hip flexion 4- 4  Hip extension    Hip abduction    Hip adduction    Hip internal rotation    Hip external rotation    Knee flexion 4+ 4+  Knee extension 4+ 5  Ankle dorsiflexion 3- 4  Ankle plantarflexion    Ankle inversion    Ankle eversion    (Blank rows = not tested)  BED MOBILITY:  Pt reports difficulty sometimes getting up due to R shoulder (hx of R shoulder surgery at end of 2023)  TRANSFERS: Assistive device utilized: None  Sit to stand: SBA and CGA Stand to sit: SBA and CGA  Performs with no UE support, decr anterior weight shift, pt with knees flexed in standing   Pt with initiation freezing in standing, before pt goes to walk.    FUNCTIONAL TESTS:  5 times sit to stand: 16.6 seconds with no UE support, bracing with BLE on chair  Timed up and go (TUG):  21.22 seconds with min A when turning and for freezing episodes during turns and straight away   10 meter walk test: 22.44 seconds = 1.46 ft/sec with min A and no AD  PATIENT SURVEYS:  Freezing of gait questionnaire 12/24 (higher score denotes more severe freezing of gait)                                                                                                                              TREATMENT DATE:   Self-care/home management  Addressed pt's questions about NPH and obtaining power scooter. Informed pt he would need to ask neurologist about NPH and the testing protocol. Also discouraged use of power scooter as pt would have to load it into/out of his vehicle and this is not safe. Pt verbalized understanding.  Pt able to teach-back freezing strategies that he implements at home and in community (stop and reset, priming lateral weight shifting, counting).  Pt inquiring about how to see Dr. Arbutus Leas and pt already has her contact info. Informed pt he would need new referral, but as he no longer has a PCP, pt unsure how to get this. Recommend he ask neurologist in [place] for one as he would prefer a closer neurologist.     Physical Performance - LTG assessment    [facility] PT Assessment - 07/27/23 1511       Balance   Balance Assessed Yes      Standardized Balance Assessment   Standardized Balance Assessment Timed Up and Go Test      Timed Up and Go Test   Normal TUG (seconds) 15.28   no AD; 18.25s w/RW           Assessed turns:  9s to R side, 11.84s to L. Significant freezing noted w/weight maintained on toes  Discussed results of OM and continue to encourage  use of RW. Although pt is slower w/RW, he is more stable. Pt verbalized understanding.      PATIENT EDUCATION: Education details: See self-care section  Person educated: Patient Education method: Explanation Education comprehension: verbalized understanding  HOME EXERCISE PROGRAM: Access Code: [email or web address] URL:  [email or web address] Date: 07/06/2023 Prepared by: Sherlie Ban  Exercises - Standing Quarter Turn with Counter Support  - 1-2 x daily - 7 x weekly - 3 sets - 10 reps - Side to Side Weight Shift with Counter Support  - 1-2 x daily - 7 x weekly - 1-2 sets - 10 reps - Side Stepping with Counter Support  - 1-2 x daily - 7 x weekly - 1-2 sets - 10 reps - Standing Single Leg Stance with Counter Support  - 1-2 x daily - 7 x weekly - 3 sets - 10-15 hold  GOALS: Goals reviewed with patient? Yes  SHORT TERM GOALS: ALL STGS = LTGS  LONG TERM GOALS: Target date: 07/05/2023  Pt will be independent with final HEP for gait/balance in order to build upon functional gains made in therapy. Baseline:  Goal status: ON-GOING  2.  Pt will verbalize/demo understanding of techniques to help with freezing episodes.  Baseline: does need reminders  Goal status: PARTIALLY MET  3.  Pt will decr FOGQ to 9/24 or less in order to demo improved freezing.  Baseline: 12/24   12/24 on 2/6 Goal status: NOT MET   4.  Pt will improve TUG time to 17 seconds or less with LRAD vs. No AD with CGA in order to demo decrease fall risk.  Baseline: 21.22 seconds with min A   17.4 seconds with no AD with CGA (when pt focuses on turning techniques) Goal status: MET  5.  Pt will improve gait speed with LRAD to at least 1.8 ft/sec in order to demo improved community mobility.   Baseline: 22.44 seconds = 1.46 ft/sec with min A and no AD  On 2/6: 14.6 seconds with no AD with close CGA/min A = 2.25 ft/sec 17.1 seconds with RW= 1.91 ft/sec  Goal status: PARTIALLY MET  6.  Pt will improve 5x sit<>stand to less than or equal to 14 sec to demonstrate improved functional strength and transfer efficiency.   Baseline: 16.6 seconds with no UE support, bracing with BLE on chair   11.8 seconds with no UE support, no BLE bracing against mat table  Goal status: MET  LONG TERM GOALS: Target date:  08/03/2023 UPDATED LTG DATE FOR POC  Pt will be independent with final HEP for gait/balance in order to build upon functional gains made in therapy. Baseline:  Goal status: MET   2.  Pt will verbalize/demo understanding of techniques to help with freezing episodes.  Baseline: does need reminders  Goal status: MET  3.  Pt will improve TUG time to 16 seconds or less with LRAD vs. No AD with CGA and appropriate turning strategies in order to demo decrease fall risk.  Baseline: 21.22 seconds with min A; 17.4 seconds with no AD with CGA (when pt focuses on turning techniques); 18.25s w/RW, 15.28s no AD and CGA (significant freezing w/turn)  Goal status: MET  4.   Pt will improve gait speed with LRAD to at least 2.0 ft/sec in order to demo improved community mobility.   Baseline: 22.44 seconds = 1.46 ft/sec with min A and no AD On 2/6: 14.6 seconds with no AD with close CGA/min A = 2.25 ft/sec 17.1 seconds with RW= 1.91  ft/sec  Goal status:MET   5.  Pt will improve 360 deg turn to R to less than 7 seconds and 360 deg turn to L to less than 9 seconds in order to demo improve turning technique   Baseline:  360 degree turn to R: 8.1 seconds  360 degree turn to L: 12 seconds with min A due to incr anterior weight shift onto toes   9s to R, 11.84s to L on 2/27  Goal status: NOT MET    ASSESSMENT:  CLINICAL IMPRESSION: Emphasis of skilled PT session on LTG assessment and DC from PT. Pt has met 4 of 5 LTGs, improving his time on TUG, his gait speed and able to teach-back freezing techniques. Pt does demonstrate increased gait speed w/o RW, but is more unstable w/shuffled gait and forward flexed posture. Continue to recommend pt use RW at all times for fall prevention and safety. Pt continues to have greatest challenge w/turns, especially to L side. Pt to seek referral to Dr. Arbutus Leas and was educated on how to return to PT in future if mobility needs change.    OBJECTIVE IMPAIRMENTS: Abnormal  gait, decreased activity tolerance, decreased balance, decreased coordination, decreased knowledge of use of DME, decreased mobility, difficulty walking, decreased ROM, decreased strength, impaired flexibility, and postural dysfunction.   ACTIVITY LIMITATIONS: standing, squatting, transfers, bed mobility, and locomotion level  PARTICIPATION LIMITATIONS: community activity  PERSONAL FACTORS: Age, Behavior pattern, Past/current experiences, Time since onset of injury/illness/exacerbation, and 3+ comorbidities: bipolar disorder (1997), HLD, HTN, OSA on CPAP, reverse R shoulder arthroplastic 04/2022, L total hip replacement 2020.   are also affecting patient's functional outcome.   REHAB POTENTIAL: Good  CLINICAL DECISION MAKING: Evolving/moderate complexity  EVALUATION COMPLEXITY: Moderate  PLAN:  PT FREQUENCY: 2x/week  PT DURATION: 8 weeks  PLANNED INTERVENTIONS: 97164- PT Re-evaluation, 97110-Therapeutic exercises, 97530- Therapeutic activity, 97112- Neuromuscular re-education, 97535- Self Care, 16109- Manual therapy, [phone]- Gait training, Patient/Family education, Balance training, Stair training, and DME instructions    Jill Alexanders Maxximus Gotay, PT ,DPT 07/27/2023, 3:22 PM

## 2023-08-03 ENCOUNTER — Ambulatory Visit: Payer: PPO | Admitting: Physical Therapy

## 2023-08-11 ENCOUNTER — Other Ambulatory Visit: Payer: Self-pay | Admitting: Neurology

## 2023-08-11 DIAGNOSIS — R251 Tremor, unspecified: Secondary | ICD-10-CM

## 2023-09-26 ENCOUNTER — Encounter: Payer: PPO | Admitting: Internal Medicine

## 2023-10-24 ENCOUNTER — Encounter: Payer: Self-pay | Admitting: Psychiatry

## 2023-10-24 ENCOUNTER — Ambulatory Visit: Payer: PPO | Admitting: Psychiatry

## 2023-10-24 DIAGNOSIS — F319 Bipolar disorder, unspecified: Secondary | ICD-10-CM | POA: Diagnosis not present

## 2023-10-24 MED ORDER — DIVALPROEX SODIUM 250 MG PO DR TAB: 500.0000 mg | DELAYED_RELEASE_TABLET | Freq: Two times a day (BID) | ORAL | 3 refills | Status: AC

## 2023-10-24 NOTE — Progress Notes (Signed)
 Timothy [name] [id number] 1950-09-07 73 y.o.  Subjective:   Patient ID:  Timothy [name] is a 73 y.o. (DOB Jan 15, 1951) male.  Chief Complaint:  Chief Complaint  Patient presents with   Follow-up    HPI Timothy [name] presents to the office today for follow-up of bipolar disorder and has been on Depakote  DR 500 mg twice daily for many years.  seen 10/2018.  No meds changed.  Typically seen yearly in recent years because of long-term stability  01/23/20 appt with the following noted: Covid free. Health good.  Sleep 6 1/2 hours with CPAP.  Alert daytime. Overall doing alright mood.  Hip replacement gone bad after 1 and 1/2 year.  Appt Tuesday.  Function otherwise OK. Doesn't plan to retire so still working. No SE. Plan no med changes  08/24/2020 appointment with the following noted: Sep end of December from [place], married less than 2 years.  She's in counseling.  Is being told  This is stressful.  Have seen each other and vacationed together but not living together.  Would like to find out if he has narcissistic tendencies bc she has said this.  She's written hateful things about him.  Asks for therapist to help him figure out if he has personality disorder. Mood stable and good despite stressors.  Patient reports stable mood and denies depressed or irritable moods.  Patient denies any recent difficulty with anxiety.  Patient denies difficulty with sleep initiation or maintenance. Denies appetite disturbance.  Patient reports that energy and motivation have been good.  Patient denies any difficulty with concentration.  Patient denies any suicidal ideation.  Plan continue Depakote  250 mg tablets 2 twice daily  10/21/2021 appointment with the following noted: Worsening  leg weakness R>L with extensive neuro workup without explanation including local and [facility]. Including 2 nerve conduction studies and brain MRI. Sig stressor ongoing relationship and working through with [employer]  She is upset  with him daily. Mood is otherwise stable. No SE with Depakote . Still seeing therapist Jonelle Neri.  10/20/22 appt noted: Separated from 2nd wife.  Better without her.   On carbidopa -levodopa  since 09/28/22 without effect. Sleep not great but enough hours.  EMA 3 AM and on better nights back to sleep but not more than 6 hours for at least a year.   Remembers mania clearly and this is not that.  Mania was euphoric and this is not that.  No racing  thoughts or other impulsivity.  No incr spending.  Frugal with retirement.  Lives within means.   Talked with brother in [place] about it.   Hard to walk with R leg.   Potential GF will leave [employer] after 13 years and will come and stay with him getting out soon.   Stopped drinking exactly in Jan.  Decided to stop  10/24/23 appt noted: Med: Depakote  250 mg tab 2 twice daily.  Also on Sinemet  No SE More gait px.  PD is a problem.  Sinemet  not helping.   Stressed by 1 son Timothy [name] woke.   Sleep pretty well.  Using CPAP 5 hour is enough.   Continues to work. PT.    Past Psychiatric Medication Trials: Depakote  500 mg twice daily,  Ambien, ProSom, lorazepam, trazodone  little effect  no hangover Effexor, paroxetine 20,  He has been on Depakote  alone since 1998  Review of Systems:  Review of Systems  Musculoskeletal:  Positive for arthralgias and gait problem. Negative for back pain.  Neurological:  Positive for weakness.  Negative for tremors.  Hip replacement 06/2018 CPAP since [facility] July 2019. Going well.  Medications: I have reviewed the patient's current medications.  Current Outpatient Medications  Medication Sig Dispense Refill   aspirin  EC 81 MG tablet Take 81 mg by mouth daily. Swallow whole.     bisoprolol -hydrochlorothiazide  (ZIAC ) 5-6.25 MG tablet TAKE 1 TABLET BY MOUTH EVERY DAY FOR BLOOD PRESSURE 90 tablet 3   carbidopa -levodopa  (SINEMET  IR) 25-100 MG tablet TAKE 1 TABLET BY MOUTH THREE TIMES A DAY 270 tablet 0   Cholecalciferol (VITAMIN D -3) 125 MCG  (5000 UT) TABS Take 5,000 Units by mouth every evening.      Cyanocobalamin  (VITAMIN B-12) 1000 MCG SUBL Take 1 tablet SL Daily (Patient taking differently: Take 1,000 mcg by mouth daily.)  0   erythromycin  ophthalmic ointment Place a 1/2 inch ribbon of ointment into the lower eyelid. 3.5 g 0   meclizine  (ANTIVERT ) 25 MG tablet Take 1/2 to 1 tablet 2 to 3 x /day as needed for Dizziness / Vertigo 90 tablet 11   divalproex  (DEPAKOTE ) 250 MG DR tablet Take 2 tablets (500 mg total) by mouth 2 (two) times daily. 360 tablet 3   No current facility-administered medications for this visit.    Medication Side Effects: None  Allergies:  Allergies  Allergen Reactions   Ciprofloxacin Diarrhea    Other Reaction(s): Unknown    Past Medical History:  Diagnosis Date   Arthritis    Bipolar disorder (HCC)    controlled by meds since 1997   Cancer (HCC)    basal cell and pre melanoma on back   Neuromuscular disorder (HCC)    gait and ambulation problems, but no diagnosis from Neurology   Pneumonia    Sleep apnea    cpap   Wears glasses    reading    Family History  Adopted: Yes    Social History   Socioeconomic History   Marital status: Legally Separated    Spouse name: Not on file   Number of children: Not on file   Years of education: Not on file   Highest education level: Not on file  Occupational History   Not on file  Tobacco Use   Smoking status: Never   Smokeless tobacco: Never  Vaping Use   Vaping status: Never Used  Substance and Sexual Activity   Alcohol  use: Yes    Comment: 5 days a week   Drug use: No   Sexual activity: Yes  Other Topics Concern   Not on file  Social History Narrative   Right handed   Social Drivers of Health   Financial Resource Strain: Not on file  Food Insecurity: Low Risk  (10/20/2023)   Received from [facility]   Hunger Vital Sign    Worried About Running Out of Food in the Last Year: Never true    Ran Out of Food in the Last  Year: Never true  Transportation Needs: No Transportation Needs (10/20/2023)   Received from [facility]    In the past 12 months, has lack of reliable transportation kept you from medical appointments, meetings, work or from getting things needed for daily living? : No  Physical Activity: Not on file  Stress: Not on file  Social Connections: Unknown (10/12/2021)   [facility] from [facility]   Social Network    Social Network: Not on file  Intimate Partner Violence: Not At Risk (05/27/2022)   Humiliation, Afraid, Rape, and Kick  questionnaire    Fear of Current or Ex-Partner: No    Emotionally Abused: No    Physically Abused: No    Sexually Abused: No    Past Medical History, Surgical history, Social history, and Family history were reviewed and updated as appropriate.   Please see review of systems for further details on the patient's review from today.   Objective:   Physical Exam:  There were no vitals taken for this visit.  Physical Exam Constitutional:      General: He is not in acute distress.    Appearance: He is well-developed.  Musculoskeletal:        General: No deformity.  Neurological:     Mental Status: He is alert and oriented to person, place, and time.     Coordination: Coordination abnormal.     Gait: Gait abnormal.  Psychiatric:        Attention and Perception: Attention normal. He is attentive. He does not perceive auditory hallucinations.        Mood and Affect: Mood normal. Mood is not anxious or depressed. Affect is not labile, blunt, angry, tearful or inappropriate.        Speech: Speech normal. Speech is not rapid and pressured.        Behavior: Behavior normal.        Thought Content: Thought content normal. Thought content is not delusional. Thought content does not include homicidal or suicidal ideation. Thought content does not include suicidal plan.        Cognition and Memory: Cognition normal.        Judgment:  Judgment normal.     Comments: Insight is good. No mania.     Lab Review:     Component Value Date/Time   NA 142 03/28/2023 1450   K 4.6 03/28/2023 1450   CL 103 03/28/2023 1450   CO2 29 03/28/2023 1450   GLUCOSE 96 03/28/2023 1450   BUN 30 (H) 03/28/2023 1450   CREATININE 1.12 03/28/2023 1450   CALCIUM 9.8 03/28/2023 1450   PROT 7.2 03/28/2023 1450   ALBUMIN 4.1 09/01/2016 1519   AST 21 03/28/2023 1450   ALT 15 03/28/2023 1450   ALKPHOS 45 09/01/2016 1519   BILITOT 0.4 03/28/2023 1450   GFRNONAA >60 05/28/2022 0357   GFRNONAA 80 07/20/2020 1539   GFRAA 93 07/20/2020 1539       Component Value Date/Time   WBC 9.0 03/28/2023 1450   RBC 4.92 03/28/2023 1450   HGB 14.3 03/28/2023 1450   HCT 43.8 03/28/2023 1450   PLT 227 03/28/2023 1450   MCV 89.0 03/28/2023 1450   MCH 29.1 03/28/2023 1450   MCHC 32.6 03/28/2023 1450   RDW 13.3 03/28/2023 1450   LYMPHSABS 2,639 12/29/2022 1157   MONOABS 0.7 02/26/2022 2127   EOSABS 63 03/28/2023 1450   BASOSABS 18 03/28/2023 1450    No results found for: "POCLITH", "LITHIUM"   Lab Results  Component Value Date   VALPROATE 41.5 (L) 03/28/2023     .res Assessment: Plan:    Bipolar I disorder (HCC) -- stable - Plan: divalproex  (DEPAKOTE ) 250 MG DR tablet  Mood stable with meds for years with Depakote  500 mg BID  No change indicated.  Good response.  Labs per PCP yearly - good as noted above incl LFT's and CBC and VPA usually low normal and working.  Disc progressive leg weakness and impact on his quality of life.  Reviewed neuro status including last year's MRI which suggests  poss NPH.   Encourage using walker.  He doesn't want to use anything but crutch.  Encourage FU neuro.  He agrees  FU 1 year.  Nori Beat, MD, DFAPA  Please see After Visit Summary for patient specific instructions.  Future Appointments  Date Time Provider Department Center  11/02/2023  1:00 PM Clem Currier, NP [facility] None    No orders of  the defined types were placed in this encounter.     -------------------------------

## 2023-11-02 ENCOUNTER — Ambulatory Visit: Payer: PPO | Admitting: Adult Health

## 2023-11-07 ENCOUNTER — Encounter: Payer: Self-pay | Admitting: Adult Health

## 2023-11-07 ENCOUNTER — Telehealth: Payer: Self-pay | Admitting: Adult Health

## 2023-11-07 ENCOUNTER — Telehealth (INDEPENDENT_AMBULATORY_CARE_PROVIDER_SITE_OTHER): Admitting: Adult Health

## 2023-11-07 DIAGNOSIS — G4733 Obstructive sleep apnea (adult) (pediatric): Secondary | ICD-10-CM | POA: Diagnosis not present

## 2023-11-07 NOTE — Progress Notes (Signed)
 PATIENT: Timothy [name] DOB: 21-Feb-1951  REASON FOR VISIT: follow up HISTORY FROM: patient PRIMARY NEUROLOGIST: Dr. Albertina Hugger   Virtual Visit via Video Note  I connected with Timothy [name] on 11/07/23 at  9:00 AM EDT by a video enabled telemedicine application located remotely at [facility] and verified that I am speaking with the correct person using two identifiers who was located at their own home in [place].   I discussed the limitations of evaluation and management by telemedicine and the availability of in person appointments. The patient expressed understanding and agreed to proceed.   HISTORY OF PRESENT ILLNESS:   Today 11/07/23:  Timothy [name] is a 73 y.o. male with a history of OSA on CPAP. Returns today for follow-up.  Reports that CPAP is working well for him.  He states that he uses it consistently.  His download is below.  He states that he has been seen at [facility] and diagnosed with Parkinson's disease.     11/01/22: Timothy [name] is a 73 y.o. male with a history of OSA on CPAP. Returns today for follow-up.  Reports that CPAP is working well for him.  He denies any new issues.  Continues to change out his supplies regularly.      HISTORY  11/16/21: Timothy [name] is a 73 year old male with a history of OSA on CPAP. He returns today for follow-up.  Reports that the CPAP is working well.  He returns today for follow-up.    REVIEW OF SYSTEMS: Out of a complete 14 system review of symptoms, the patient complains only of the following symptoms, and all other reviewed systems are negative.  ESS 3   ALLERGIES: Allergies  Allergen Reactions   Ciprofloxacin Diarrhea    Other Reaction(s): Unknown    HOME MEDICATIONS: Outpatient Medications Prior to Visit  Medication Sig Dispense Refill   aspirin  EC 81 MG tablet Take 81 mg by mouth daily. Swallow whole.     bisoprolol -hydrochlorothiazide  (ZIAC ) 5-6.25 MG tablet TAKE 1 TABLET BY MOUTH EVERY DAY  FOR BLOOD PRESSURE 90 tablet 3   carbidopa -levodopa  (SINEMET  IR) 25-100 MG tablet TAKE 1 TABLET BY MOUTH THREE TIMES A DAY 270 tablet 0   Cholecalciferol (VITAMIN D -3) 125 MCG (5000 UT) TABS Take 5,000 Units by mouth every evening.      Cyanocobalamin  (VITAMIN B-12) 1000 MCG SUBL Take 1 tablet SL Daily (Patient taking differently: Take 1,000 mcg by mouth daily.)  0   divalproex  (DEPAKOTE ) 250 MG DR tablet Take 2 tablets (500 mg total) by mouth 2 (two) times daily. 360 tablet 3   erythromycin  ophthalmic ointment Place a 1/2 inch ribbon of ointment into the lower eyelid. 3.5 g 0   meclizine  (ANTIVERT ) 25 MG tablet Take 1/2 to 1 tablet 2 to 3 x /day as needed for Dizziness / Vertigo 90 tablet 11   No facility-administered medications prior to visit.    PAST MEDICAL HISTORY: Past Medical History:  Diagnosis Date   Arthritis    Bipolar disorder (HCC)    controlled by meds since 1997   Cancer (HCC)    basal cell and pre melanoma on back   Neuromuscular disorder (HCC)    gait and ambulation problems, but no diagnosis from Neurology   Pneumonia    Sleep apnea    cpap   Wears glasses    reading    PAST SURGICAL HISTORY: Past Surgical History:  Procedure Laterality Date   APPENDECTOMY  COLONOSCOPY     KNEE ARTHROSCOPY W/ MENISCAL REPAIR  2004   left   ORIF HUMERUS FRACTURE Left 09/19/2013   Procedure: LEFT OPEN REDUCTION INTERNAL FIXATION (ORIF) PROXIMAL HUMERUS; ROTATOR CUFF REPAIR;  Surgeon: Ferd Householder, MD;  Location: [facility];  Service: Orthopedics;  Laterality: Left;   REVERSE SHOULDER ARTHROPLASTY Right 05/27/2022   Procedure: REVERSE SHOULDER ARTHROPLASTY;  Surgeon: Janeth Medicus, MD;  Location: [facility];  Service: Orthopedics;  Laterality: Right;  120   TONSILLECTOMY     TOTAL HIP ARTHROPLASTY Left 07/18/2018   Procedure: TOTAL HIP ARTHROPLASTY ANTERIOR APPROACH;  Surgeon: Adonica Hoose, MD;  Location: [facility];  Service: Orthopedics;  Laterality:  Left;    FAMILY HISTORY: Family History  Adopted: Yes    SOCIAL HISTORY: Social History   Socioeconomic History   Marital status: Legally Separated    Spouse name: Not on file   Number of children: Not on file   Years of education: Not on file   Highest education level: Not on file  Occupational History   Not on file  Tobacco Use   Smoking status: Never   Smokeless tobacco: Never  Vaping Use   Vaping status: Never Used  Substance and Sexual Activity   Alcohol  use: Yes    Comment: 5 days a week   Drug use: No   Sexual activity: Yes  Other Topics Concern   Not on file  Social History Narrative   Right handed   Social Drivers of Health   Financial Resource Strain: Not on file  Food Insecurity: Low Risk  (10/27/2023)   Received from [facility]   Hunger Vital Sign    Worried About Running Out of Food in the Last Year: Never true    Ran Out of Food in the Last Year: Never true  Transportation Needs: No Transportation Needs (10/27/2023)   Received from [facility]    In the past 12 months, has lack of reliable transportation kept you from medical appointments, meetings, work or from getting things needed for daily living? : No  Physical Activity: Not on file  Stress: Not on file  Social Connections: Unknown (10/12/2021)   Received from [facility]   Social Network    Social Network: Not on file  Intimate Partner Violence: Not At Risk (05/27/2022)   Humiliation, Afraid, Rape, and Kick questionnaire    Fear of Current or Ex-Partner: No    Emotionally Abused: No    Physically Abused: No    Sexually Abused: No      PHYSICAL EXAM    Generalized: Well developed, in no acute distress  Chest: Lungs clear to auscultation bilaterally  Neurological examination  Mentation: Alert oriented to time, place, history taking. Follows all commands speech and language fluent Cranial nerve II-XII: facial symmetry noted   DIAGNOSTIC DATA  (LABS, IMAGING, TESTING) - I reviewed patient records, labs, notes, testing and imaging myself where available.  Lab Results  Component Value Date   WBC 9.0 03/28/2023   HGB 14.3 03/28/2023   HCT 43.8 03/28/2023   MCV 89.0 03/28/2023   PLT 227 03/28/2023      Component Value Date/Time   NA 142 03/28/2023 1450   K 4.6 03/28/2023 1450   CL 103 03/28/2023 1450   CO2 29 03/28/2023 1450   GLUCOSE 96 03/28/2023 1450   BUN 30 (H) 03/28/2023 1450   CREATININE 1.12 03/28/2023 1450   CALCIUM 9.8 03/28/2023 1450  PROT 7.2 03/28/2023 1450   ALBUMIN 4.1 09/01/2016 1519   AST 21 03/28/2023 1450   ALT 15 03/28/2023 1450   ALKPHOS 45 09/01/2016 1519   BILITOT 0.4 03/28/2023 1450   GFRNONAA >60 05/28/2022 0357   GFRNONAA 80 07/20/2020 1539   GFRAA 93 07/20/2020 1539   Lab Results  Component Value Date   CHOL 162 03/28/2023   HDL 52 03/28/2023   LDLCALC 79 03/28/2023   TRIG 222 (H) 03/28/2023   CHOLHDL 3.1 03/28/2023   Lab Results  Component Value Date   HGBA1C 5.5 03/28/2023   Lab Results  Component Value Date   VITAMINB12 747 03/28/2023   Lab Results  Component Value Date   TSH 0.55 03/28/2023      ASSESSMENT AND PLAN 73 y.o. year old male  has a past medical history of Arthritis, Bipolar disorder (HCC), Cancer (HCC), Neuromuscular disorder (HCC), Pneumonia, Sleep apnea, and Wears glasses. here with:  OSA on CPAP  - CPAP compliance excellent - Good treatment of AHI  - Encourage patient to use CPAP nightly and > 4 hours each night - F/U in 1 year or sooner if needed   Clem Currier, MSN, NP-C 11/07/2023, 9:21 AM [facility] [address], [address] [place], [place] [address] [phone]

## 2023-11-07 NOTE — Telephone Encounter (Signed)
 Patient called to get link for MyChart Video Visit. Inform patient would need to log in on MyChart. Later realized he was scheduled for office visit. Jacqlyn Matas, NP instructed to change his appt to MyChart Video. Transfer call to Jael to help him log in.

## 2023-12-29 ENCOUNTER — Ambulatory Visit: Payer: PPO | Admitting: Nurse Practitioner

## 2024-02-20 NOTE — Progress Notes (Unsigned)
 NEUROLOGY FOLLOW UP OFFICE NOTE  [name] [id number]  Assessment/Plan:   Abnormality of gait.  Unclear etiology.  He does have chronic right sided low back pain with neural foraminal stenosis in the lumbar spine but his spine specialist doesn't believe it is the cause.  He has some arthritis in the right hip as well, which may be playing a role.  No myelopathy on MRI of spinal cord.  The pain is the foot may be due to abnormal weight bearing.  His gait is not typical for antalgic gait.  With the shuffling and freezing, consider Parkinsonian syndrome.  MRI does not reveal evidence for vascular parkinsonism.  He otherwise does not look parkinson.  He sometimes notes a tremor from time to time.    Check DaT scan - further recommendations pending results. Refer to physical therapy for back pain/gait instability Offered referral to podiatry.  Declines at this time  Total time of 42 minutes spent both reviewing his chart and with face-to-face discussion and examination.    Juliene Dunnings, DO   Subjective:  Timothy [name] is a 73 year old male with HTN, prediabetes, OSA on CPAP, IBS and Bipolar Disorder who follows up for questionable Parkinson's disease   UPDATE: Last seen in April 2024.  He began experiencing tremors in hands.  DaTScan  on 09/23/2022 demonstrated moderate decreased relative radiotracer activity posterior LEFT putamen.  Equivocal exam could indicate early Parkinsonian syndrome pathology.  He was started on Sinemet  IR 25-100mg  three times daily.  He was seen in the [facility] at [facility] where Parkinson's disease was suspected.  Sinemet  was increased to 1.5 tablet four times daily dosing and referred for PT to help with gait.  ***   HISTORY: He had left total hip replacement in February 2020.  Following the procedure, he continued to experience left hip pain, causing him to limp.  He slowly improved but after a few months gradually felt weaker again.   He has some localized back pain but denies numbness and pain in the legs.  He is able to stand up from a chair but reports effort.  When he went snorkeling last summer, he had trouble kicking his legs.  Denies change in bowel or bladder function.  Denies perineal numbness.  Upper extremities feel fine.  Denies double vision, neck weakness, dysphagia or dyspnea.  He returned to his orthopedist, who told him that his hip was okay.  He was referred to neurosurgeon who told him that he had degenerative disc disease and recommended physical therapy but that surgery was not indicated.  He continues to have low back pain and left hip pain, which he treats with Aleve.  He feels both legs are weak, left greater than right.  No numbness or radicular pain down the leg.  No bowel or bladder dysfunction.  He received L5-S1 interlaminar injection in December 2021 without relief.  CT lumbar myelogram on 10/02/2020 personally reviewed multilevel degenerative disc disease with left foraminal stenosis at L3-4, bilateral foraminal stenosis at L4-5 (affecting either L4 nerve) and right foraminal stenosis at L5-S1 (potentially affecting L5 nerve).  NCV-EMG of bilateral lower extremities on 12/01/2020 showed mild chronic right L5 radiculopathy but otherwise unremarkable.  MRI of cervical and thoracic spine on 04/20/2021 showed degenerative changes but no spinal canal stenosis causing myelopathy.  MRI of brain on 04/29/2021 showed incidental remote small infarct in the medial right parietal lobe and within the posterior left frontal white matter but no  explanation for his subjective bilateral lower extremity weakness.  He went to [facility] where they repeated a NCV-EMG on 09/29/2021 which was negative.  Symptoms are getting worse.  Reports edema in the right leg and numbness in right foot.  The numbness is so severe he cannot feel the pedal of the car. If he has his legs up, he feels a pain down the lateral leg.  Feels pain in right lower back.   He  did go back to the spine specialist and had an epidural injection but did not respond to it.  The chiropractor did not help.  Does not note any significant numbness in foot.  2 weeks ago, started feeling a sharp pain at his instep of the right foot.  Feels it when bearing weight or press it. He feels that his right foot just gets stuck.  In addition to the pain affecting gait, he states that he just can't move the right foot.  It may take a moment to finally get it moving and he starts walking a little better once he gets a few steps in.  CT left hip at that time revealed moderate arthritis.    PAST MEDICAL HISTORY: Past Medical History:  Diagnosis Date   Arthritis    Bipolar disorder (HCC)    controlled by meds since 1997   Cancer (HCC)    basal cell and pre melanoma on back   Neuromuscular disorder (HCC)    gait and ambulation problems, but no diagnosis from Neurology   Pneumonia    Sleep apnea    cpap   Wears glasses    reading    MEDICATIONS: Current Outpatient Medications on File Prior to Visit  Medication Sig Dispense Refill   aspirin  EC 81 MG tablet Take 81 mg by mouth daily. Swallow whole.     bisoprolol -hydrochlorothiazide  (ZIAC ) 5-6.25 MG tablet TAKE 1 TABLET BY MOUTH EVERY DAY FOR BLOOD PRESSURE 90 tablet 3   carbidopa -levodopa  (SINEMET  IR) 25-100 MG tablet TAKE 1 TABLET BY MOUTH THREE TIMES A DAY 270 tablet 0   Cholecalciferol (VITAMIN D -3) 125 MCG (5000 UT) TABS Take 5,000 Units by mouth every evening.      Cyanocobalamin  (VITAMIN B-12) 1000 MCG SUBL Take 1 tablet SL Daily (Patient taking differently: Take 1,000 mcg by mouth daily.)  0   divalproex  (DEPAKOTE ) 250 MG DR tablet Take 2 tablets (500 mg total) by mouth 2 (two) times daily. 360 tablet 3   erythromycin  ophthalmic ointment Place a 1/2 inch ribbon of ointment into the lower eyelid. 3.5 g 0   meclizine  (ANTIVERT ) 25 MG tablet Take 1/2 to 1 tablet 2 to 3 x /day as needed for Dizziness / Vertigo 90 tablet 11   No  current facility-administered medications on file prior to visit.    ALLERGIES: Allergies  Allergen Reactions   Ciprofloxacin Diarrhea    Other Reaction(s): Unknown    FAMILY HISTORY: Family History  Adopted: Yes      Objective:  *** General: No acute distress.  Patient appears well-groomed.   ***   Juliene Dunnings, DO

## 2024-02-21 ENCOUNTER — Ambulatory Visit (INDEPENDENT_AMBULATORY_CARE_PROVIDER_SITE_OTHER): Admitting: Neurology

## 2024-02-21 ENCOUNTER — Encounter: Payer: Self-pay | Admitting: Neurology

## 2024-02-21 VITALS — BP 120/62 | HR 62 | Ht 68.0 in | Wt 165.0 lb

## 2024-02-21 DIAGNOSIS — R269 Unspecified abnormalities of gait and mobility: Secondary | ICD-10-CM

## 2024-02-21 DIAGNOSIS — G9389 Other specified disorders of brain: Secondary | ICD-10-CM | POA: Diagnosis not present

## 2024-02-21 DIAGNOSIS — G20C Parkinsonism, unspecified: Secondary | ICD-10-CM | POA: Diagnosis not present

## 2024-02-21 NOTE — Patient Instructions (Signed)
 Refer to Dr. Rosslyn, neurosurgeon upstairs, for evaluation of possible normal pressure hydrocephalus Follow up in 4 months Continue the sinement three times daily for now.

## 2024-03-19 ENCOUNTER — Ambulatory Visit: Admitting: Neurosurgery

## 2024-03-19 ENCOUNTER — Inpatient Hospital Stay
Admission: RE | Admit: 2024-03-19 | Discharge: 2024-03-19 | Disposition: A | Discharge status: Home / Self Care | Payer: Self-pay | Source: Ambulatory Visit | Attending: Neurosurgery | Admitting: Neurosurgery

## 2024-03-19 ENCOUNTER — Encounter: Payer: Self-pay | Admitting: Neurosurgery

## 2024-03-19 ENCOUNTER — Other Ambulatory Visit: Payer: Self-pay

## 2024-03-19 VITALS — BP 142/84 | HR 49 | Ht 69.0 in | Wt 170.0 lb

## 2024-03-19 DIAGNOSIS — R269 Unspecified abnormalities of gait and mobility: Secondary | ICD-10-CM

## 2024-03-19 DIAGNOSIS — R29898 Other symptoms and signs involving the musculoskeletal system: Secondary | ICD-10-CM

## 2024-03-19 DIAGNOSIS — Z049 Encounter for examination and observation for unspecified reason: Secondary | ICD-10-CM

## 2024-03-19 NOTE — Progress Notes (Signed)
 Assessment : Discussed the use of AI scribe software for clinical note transcription with the patient, who gave verbal consent to proceed.  History of Present Illness Timothy [name] is a 73 year old male who presents with progressive right leg weakness and difficulty walking.  He has experienced progressive weakness in his right leg since the end of 2022, significantly impacting his ability to walk and engage in activities he previously enjoyed, such as swimming underwater. The weakness has plateaued over the past year. He experiences weakness in his right leg without pain and has no issues with his arms related to this condition.  He has rare episodes of urinary incontinence, typically when urgency is ignored, but no nocturnal accidents. His memory remains intact.  He has undergone extensive testing, including a brain MRI, which initially suggested a slight hint of Parkinson's disease. He was prescribed carbidopa  for six to seven months, which did not alleviate his symptoms. Subsequent evaluation at [facility] suggested some form of Parkinson's, but his neurologist later told him it was not Parkinson's. Multiple MRIs have been performed, including of the thoracic spine, and there was a consideration of hydrocephalus as an alternative diagnosis from the brain MRI.  He lives alone and manages daily activities independently, although he uses a wheelchair for long distances and motorized carts for grocery shopping. He can walk short distances with concentration on his right leg movement.  Patient is an actively practicing attorney.   Plan : I walked in the hallways with him and he definitely has a wide-based shuffling gait and very poor balance.  His reflexes are low and MRIs of the thoracic and lumbar spine done in 2022 did not demonstrate any cord compression or cord pathology. He has very minimal urge but rarely has any urinary incontinence.  There are no memory problems.  His  MRI shows ventriculomegaly but no transependymal flow. I reviewed the diagnosis of normal pressure hydrocephalus with him.  It is odd that his symptoms are predominantly in his lower extremities and that he has barely any other symptoms.  I told him that I would like to get a lumbar puncture and we will have physical therapy evaluate him before and after.  I explained to him the rationale behind this and he is going to have to pay close attention to whether or not he notices any difference in this.  I will see him back thereafter.   Social History   Socioeconomic History   Marital status: Legally Separated    Spouse name: Not on file   Number of children: Not on file   Years of education: Not on file   Highest education level: Not on file  Occupational History   Not on file  Tobacco Use   Smoking status: Never   Smokeless tobacco: Never  Vaping Use   Vaping status: Never Used  Substance and Sexual Activity   Alcohol  use: Yes    Comment: 5 days a week   Drug use: No   Sexual activity: Yes  Other Topics Concern   Not on file  Social History Narrative   Right handed   Social Drivers of Health   Financial Resource Strain: Not on file  Food Insecurity: Low Risk  (11/16/2023)   Received from [facility]   Hunger Vital Sign    Within the past 12 months, you worried that your food would run out before you got money to buy more: Never true    Within the past 12  months, the food you bought just didn't last and you didn't have money to get more. : Never true  Transportation Needs: No Transportation Needs (11/16/2023)   Received from [facility]    In the past 12 months, has lack of reliable transportation kept you from medical appointments, meetings, work or from getting things needed for daily living? : No  Physical Activity: Not on file  Stress: Not on file  Social Connections: Unknown (10/12/2021)   Received from [facility]   Social Network    Social  Network: Not on file  Intimate Partner Violence: Not At Risk (05/27/2022)   Humiliation, Afraid, Rape, and Kick questionnaire    Fear of Current or Ex-Partner: No    Emotionally Abused: No    Physically Abused: No    Sexually Abused: No    Family History  Adopted: Yes    Allergies  Allergen Reactions   Ciprofloxacin Diarrhea    Other Reaction(s): Unknown    Past Medical History:  Diagnosis Date   Arthritis    Bipolar disorder (HCC)    controlled by meds since 1997   Cancer (HCC)    basal cell and pre melanoma on back   Neuromuscular disorder (HCC)    gait and ambulation problems, but no diagnosis from Neurology   Pneumonia    Sleep apnea    cpap   Wears glasses    reading    Past Surgical History:  Procedure Laterality Date   APPENDECTOMY     COLONOSCOPY     KNEE ARTHROSCOPY W/ MENISCAL REPAIR  2004   left   ORIF HUMERUS FRACTURE Left 09/19/2013   Procedure: LEFT OPEN REDUCTION INTERNAL FIXATION (ORIF) PROXIMAL HUMERUS; ROTATOR CUFF REPAIR;  Surgeon: Toribio JULIANNA Chancy, MD;  Location: [facility];  Service: Orthopedics;  Laterality: Left;   REVERSE SHOULDER ARTHROPLASTY Right 05/27/2022   Procedure: REVERSE SHOULDER ARTHROPLASTY;  Surgeon: Sharl Selinda Dover, MD;  Location: [facility];  Service: Orthopedics;  Laterality: Right;  120   TONSILLECTOMY     TOTAL HIP ARTHROPLASTY Left 07/18/2018   Procedure: TOTAL HIP ARTHROPLASTY ANTERIOR APPROACH;  Surgeon: Fidel Rogue, MD;  Location: [facility];  Service: Orthopedics;  Laterality: Left;     Physical Exam HENT:     Head: Normocephalic.     Nose: Nose normal.  Eyes:     Pupils: Pupils are equal, round, and reactive to light.  Cardiovascular:     Rate and Rhythm: Normal rate.  Pulmonary:     Effort: Pulmonary effort is normal.  Abdominal:     General: Abdomen is flat.  Musculoskeletal:     Cervical back: Normal range of motion.  Neurological:     Mental Status: He is alert.     Cranial Nerves:  Cranial nerves 2-12 are intact.     Sensory: Sensation is intact.     Motor: Motor function is intact.     Gait: Gait abnormal.     Deep Tendon Reflexes: Babinski sign absent on the right side. Babinski sign absent on the left side.     Reflex Scores:      Patellar reflexes are 1+ on the right side and 1+ on the left side.      Achilles reflexes are 1+ on the right side and 1+ on the left side.       Results for orders placed or performed during the hospital encounter of 04/29/21  MR BRAIN W WO CONTRAST   Narrative  CLINICAL DATA:  Provided history: Unsteady gait. Weakness of lower extremity, unspecified laterality. Lower extremity weakness/on steady gait. Additional history provided by scanning technologist: Patient reports difficulty walking for 2 years, lower extremity weakness.  EXAM: MRI HEAD WITHOUT AND WITH CONTRAST  TECHNIQUE: Multiplanar, multiecho pulse sequences of the brain and surrounding structures were obtained without and with intravenous contrast.  CONTRAST:  17mL MULTIHANCE  GADOBENATE DIMEGLUMINE  529 MG/ML IV SOLN  COMPARISON:  Cervical spine MRI 04/20/2021.  FINDINGS: Brain:  Mild intermittent motion degradation.  Mild-to-moderate generalized cerebral and cerebellar atrophy.  Small chronic cortically based infarct within the medial right parietal lobe.  Chronic lacunar infarct within the posterior left frontal lobe white matter. Background minimal cerebral white matter chronic small vessel ischemic changes.  Chronic microhemorrhage within the dorsal left medulla (series 10, image 5).  There is no acute infarct.  No evidence of an intracranial mass.  No extra-axial fluid collection.  No midline shift.  No pathologic intracranial enhancement identified.  Vascular: Maintained flow voids within the proximal large arterial vessels.  Skull and upper cervical spine: No focal suspicious marrow lesion. Incompletely assessed cervical  spondylosis. Trace C3-C4 grade 1 retrolisthesis.  Sinuses/Orbits: Visualized orbits show no acute finding. Trace mucosal thickening within the bilateral ethmoid sinuses.  IMPRESSION: Mildly motion degraded exam.  No evidence of acute intracranial abnormality.  Small chronic cortically-based infarct within the medial right parietal lobe.  Chronic lacunar infarct within the posterior left frontal lobe white matter. Minimal background cerebral white matter chronic small vessel ischemic disease.  Mild-to-moderate generalized cerebral and cerebellar atrophy.   Electronically Signed   By: Rockey Childs D.O.   On: 04/29/2021 15:36

## 2024-03-20 ENCOUNTER — Other Ambulatory Visit: Payer: Self-pay | Admitting: Radiology

## 2024-03-28 ENCOUNTER — Ambulatory Visit (HOSPITAL_COMMUNITY)
Admission: RE | Admit: 2024-03-28 | Discharge: 2024-03-28 | Disposition: A | Discharge status: Home / Self Care | Source: Ambulatory Visit | Attending: Neurosurgery | Admitting: Neurosurgery

## 2024-03-28 ENCOUNTER — Ambulatory Visit (HOSPITAL_COMMUNITY)
Admission: RE | Admit: 2024-03-28 | Discharge: 2024-03-28 | Disposition: A | Source: Ambulatory Visit | Attending: *Deleted | Admitting: *Deleted

## 2024-03-28 DIAGNOSIS — G912 (Idiopathic) normal pressure hydrocephalus: Secondary | ICD-10-CM | POA: Insufficient documentation

## 2024-03-28 DIAGNOSIS — R269 Unspecified abnormalities of gait and mobility: Secondary | ICD-10-CM | POA: Insufficient documentation

## 2024-03-28 MED ORDER — ACETAMINOPHEN 325 MG PO TABS: 650.0000 mg | ORAL_TABLET | ORAL | Status: DC | PRN

## 2024-03-28 MED ORDER — LIDOCAINE 1 % OPTIME INJ - NO CHARGE
5.0000 mL | Freq: Once | INTRAMUSCULAR | Status: AC
  Administered 2024-03-28: 3 mL via INTRADERMAL
  Filled 2024-03-28: qty 6

## 2024-03-28 NOTE — Progress Notes (Signed)
 Patient and patient daughter given discharge instructions, education provided no further questions at this time. Patient able to void before discharge no issues noted. Patient site is clean, dry, intact upon discharge. Daughter called and reviewed d/c instructions, states she understands and will be able to be with the patient with 24 hours. No further questions at this time.

## 2024-03-28 NOTE — Procedures (Signed)
 PROCEDURE SUMMARY:  Successful fluoroscopic guided lumbar puncture.  Opening pressure was 14 cm H2O.  ~30 mL clear colorless fluid collected. Closing pressure was 9 cm H2O.  No immediate complications.  Pt tolerated well.   EBL = none  Please see full dictation in imaging section of Epic for procedure details.  Cherryl Babin NP 03/28/2024 11:24 AM

## 2024-04-02 ENCOUNTER — Encounter: Payer: Self-pay | Admitting: Neurosurgery

## 2024-04-02 ENCOUNTER — Ambulatory Visit (INDEPENDENT_AMBULATORY_CARE_PROVIDER_SITE_OTHER): Admitting: Neurosurgery

## 2024-04-02 VITALS — BP 121/75 | HR 60 | Ht 69.0 in | Wt 168.0 lb

## 2024-04-02 DIAGNOSIS — R29898 Other symptoms and signs involving the musculoskeletal system: Secondary | ICD-10-CM

## 2024-04-02 DIAGNOSIS — R269 Unspecified abnormalities of gait and mobility: Secondary | ICD-10-CM

## 2024-04-02 NOTE — Progress Notes (Signed)
 73 year old gentleman with progressive lower extremity weakness who we monitored workup for normal pressure hydrocephalus.  He had a lumbar puncture which demonstrated an opening pressure of 14 cm and 30 cc of CSF was taken out.  He returns today and tells me that he did not notice any difference whatsoever after the lumbar puncture.  Given that, I do not think that a shunt is going to be helpful to him.  He had anticipated that already and voiced understanding.  If there is anything I can do for him in the future, he is welcome to come and see me.

## 2024-06-20 NOTE — Progress Notes (Unsigned)
 "  Virtual Visit via Video Note:   Consent was obtained for video visit:  Yes.   Answered questions that patient had about telehealth interaction:  Yes.   I discussed the limitations, risks, security and privacy concerns of performing an evaluation and management service by telemedicine. I also discussed with the patient that there may be a patient responsible charge related to this service. The patient expressed understanding and agreed to proceed.  Pt location: Home Physician Location: office Name of referring provider:  No ref. provider found I connected with Timothy [name] at patients initiation/request on 06/24/2024 at  8:30 AM EST by video enabled telemedicine application and verified that I am speaking with the correct person using two identifiers. Pt MRN:  [id number] Pt DOB:  January 05, 1951 Video Participants:  Timothy [name]  Assessment/Plan:   Abnormality of gait.   Parkinsonism Ventriculomegaly  At this time, I suspect an atypical parkinsonism/Parkinson-plus syndrome such as progressive nuclear palsy.  His gait is not consistent with Parkinson's disease and the diagnostic LP test was negative.  ***   Juliene Dunnings, DO   Subjective:  Timothy [name] is a 74 year old male with HTN, prediabetes, OSA on CPAP, IBS and Bipolar Disorder who follows up for gait instablity.  History supplemented by notes from the Movement Disorder Clinic at [facility].  UPDATE: He was referred to Dr. Rosslyn of neurosurgery for evaluation of possible NPH.  He was evaluated on 03/19/2024.  He questioned the possible diagnosis as there was absence of transependymal flow on MRI and his symptoms were predominantly in his lower extremities.  He underwent a lumbar puncture on 03/28/2024 with PT evaluation before and after.  Opening pressure was 14 cm water  and 30 cc of CSF was taken out.  He did not demonstrate any improvement, thus shunt deemed unlikely to be helpful.    He continues on carbidopa -levodopa   25-100mg  three times daily.  ***   He reports possibly mild difficulty swallowing.  Denies urinary incontinence.  Denies cognitive changes.  HISTORY: He had left total hip replacement in February 2020.  Following the procedure, he continued to experience left hip pain, causing him to limp.  He slowly improved but after a few months gradually felt weaker again.  He has some localized back pain but denied numbness and pain in the legs.  He is able to stand up from a chair but reports effort.  When he went snorkeling, he had trouble kicking his legs.  Denies change in bowel or bladder function.  Denied. perineal numbness.  Upper extremities feel fine.  He returned to his orthopedist, who told him that his hip was okay.  He was referred to neurosurgeon who told him that he had degenerative disc disease and recommended physical therapy but that surgery was not indicated.  He received L5-S1 interlaminar injection in December 2021 without relief.  CT lumbar myelogram on 10/02/2020 personally reviewed multilevel degenerative disc disease with left foraminal stenosis at L3-4, bilateral foraminal stenosis at L4-5 (affecting either L4 nerve) and right foraminal stenosis at L5-S1 (potentially affecting L5 nerve).  NCV-EMG of bilateral lower extremities on 12/01/2020 showed mild chronic right L5 radiculopathy but otherwise unremarkable.  MRI of cervical and thoracic spine on 04/20/2021 showed degenerative changes but no spinal canal stenosis causing myelopathy.  Symptoms started getting worse.  Reports edema in the right leg and numbness in right foot.  The numbness is so severe he cannot feel the pedal of the car. If he has  his legs up, he feels a pain down the lateral leg.  Feels pain in right lower back.   He did go back to the spine specialist and had an epidural injection but did not respond to it.  The chiropractor did not help.  He went to [facility] where they repeated a NCV-EMG on 09/29/2021 which was negative.    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    He started feeling that his right foot will just get stuck.  He began experiencing tremors in hands.  DaTScan  on 09/23/2022 demonstrated moderate decreased relative radiotracer activity posterior LEFT putamen.  Equivocal exam could indicate early Parkinsonian syndrome pathology.  He was started on Sinemet  IR 25-100mg  three times daily.  He was seen in the Movement Disorder Clinic at [facility] where Parkinson's disease was suspected.  Sinemet  was increased to 1.5 tablet four times daily dosing and referred for PT to help with gait.  He has not noticed any improvement so he decreased Sinemet  back to 1 tablet three times daily.  Uses a crutch to help ambulate.  While he is extremely unsteady he denies falling backwards.  Past Medical History: Past Medical History:  Diagnosis Date   Arthritis    Bipolar disorder (HCC)    controlled by meds since 1997   Cancer (HCC)    basal cell and pre melanoma on back   Neuromuscular disorder (HCC)    gait and ambulation problems, but no diagnosis from Neurology   Pneumonia    Sleep apnea    cpap   Wears glasses    reading    Medications: Outpatient Encounter Medications as of 06/24/2024  Medication Sig   aspirin  EC 81 MG tablet Take 81 mg by mouth daily. Swallow whole.   bisoprolol -hydrochlorothiazide  (ZIAC ) 10-6.25 MG tablet Take 1 tablet by mouth daily.   bisoprolol -hydrochlorothiazide  (ZIAC ) 5-6.25 MG tablet TAKE 1 TABLET BY MOUTH EVERY DAY FOR BLOOD PRESSURE   carbidopa -levodopa  (SINEMET  IR) 25-100 MG tablet TAKE 1 TABLET BY MOUTH THREE TIMES A DAY   Cholecalciferol (VITAMIN D -3) 125 MCG (5000 UT) TABS Take 5,000 Units by mouth every evening.    Cyanocobalamin  (VITAMIN B-12) 1000 MCG SUBL Take 1 tablet SL Daily   divalproex  (DEPAKOTE ) 250 MG DR tablet Take 2  tablets (500 mg total) by mouth 2 (two) times daily.   meclizine  (ANTIVERT ) 25 MG tablet Take 1/2 to 1 tablet 2 to 3 x /day as needed for Dizziness / Vertigo (Patient taking differently: Take 25 mg by mouth as needed. Take 1/2 to 1 tablet 2 to 3 x /day as needed for Dizziness / Vertigo)   No facility-administered encounter medications on file as of 06/24/2024.    Allergies: Allergies[1]  Family History: Family History  Adopted: Yes    Observations/Objective:   No acute distress.  Alert and oriented.  Speech fluent and not dysarthric.  Language intact.  Eyes orthophoric on primary gaze.  Face symmetric.   Follow up Instructions:      -I discussed the assessment and treatment plan with the patient. The patient was provided an opportunity to ask questions and all were answered. The patient agreed with the plan and demonstrated an understanding of the instructions.   The patient was advised to call back or seek an in-person evaluation if the symptoms worsen or if the condition fails to improve  as anticipated.   Juliene Lamar Dunnings, DO     [1] No Active Allergies  "

## 2024-06-24 ENCOUNTER — Telehealth: Admitting: Neurology

## 2024-06-25 NOTE — Progress Notes (Unsigned)
 "  Virtual Visit via Video Note:   Consent was obtained for video visit:  Yes.   Answered questions that patient had about telehealth interaction:  Yes.   I discussed the limitations, risks, security and privacy concerns of performing an evaluation and management service by telemedicine. I also discussed with the patient that there may be a patient responsible charge related to this service. The patient expressed understanding and agreed to proceed.  Pt location: Home Physician Location: office Name of referring provider:  No ref. provider found I connected with Timothy [name] at patients initiation/request on 06/26/2024 at  1:30 PM EST by video enabled telemedicine application and verified that I am speaking with the correct person using two identifiers. Pt MRN:  [id number] Pt DOB:  [birth year] Video Participants:  Timothy [name]  Assessment/Plan:   Abnormality of gait.   Parkinsonism Ventriculomegaly  At this time, I suspect an atypical parkinsonism/Parkinson-plus syndrome such as progressive nuclear palsy.  His gait is not consistent with Parkinson's disease and the diagnostic LP test was negative.  ***   Juliene Dunnings, DO   Subjective:  Timothy [name] is a 74 year old male with HTN, prediabetes, OSA on CPAP, IBS and Bipolar Disorder who follows up for gait instablity.  History supplemented by notes from the Movement Disorder Clinic at [facility].  UPDATE: He was referred to Dr. Rosslyn of neurosurgery for evaluation of possible NPH.  He was evaluated on 03/19/2024.  He questioned the possible diagnosis as there was absence of transependymal flow on MRI and his symptoms were predominantly in his lower extremities.  He underwent a lumbar puncture on 03/28/2024 with PT evaluation before and after.  Opening pressure was 14 cm water  and 30 cc of CSF was taken out.  He did not demonstrate any improvement, thus shunt deemed unlikely to be helpful.    He continues on carbidopa -levodopa   25-100mg  three times daily.  ***   He reports possibly mild difficulty swallowing.  Denies urinary incontinence.  Denies cognitive changes.  HISTORY: He had left total hip replacement in February 2020.  Following the procedure, he continued to experience left hip pain, causing him to limp.  He slowly improved but after a few months gradually felt weaker again.  He has some localized back pain but denied numbness and pain in the legs.  He is able to stand up from a chair but reports effort.  When he went snorkeling, he had trouble kicking his legs.  Denies change in bowel or bladder function.  Denied. perineal numbness.  Upper extremities feel fine.  He returned to his orthopedist, who told him that his hip was okay.  He was referred to neurosurgeon who told him that he had degenerative disc disease and recommended physical therapy but that surgery was not indicated.  He received L5-S1 interlaminar injection in December 2021 without relief.  CT lumbar myelogram on 10/02/2020 personally reviewed multilevel degenerative disc disease with left foraminal stenosis at L3-4, bilateral foraminal stenosis at L4-5 (affecting either L4 nerve) and right foraminal stenosis at L5-S1 (potentially affecting L5 nerve).  NCV-EMG of bilateral lower extremities on 12/01/2020 showed mild chronic right L5 radiculopathy but otherwise unremarkable.  MRI of cervical and thoracic spine on 04/20/2021 showed degenerative changes but no spinal canal stenosis causing myelopathy.  Symptoms started getting worse.  Reports edema in the right leg and numbness in right foot.  The numbness is so severe he cannot feel the pedal of the car. If he has  his legs up, he feels a pain down the lateral leg.  Feels pain in right lower back.   He did go back to the spine specialist and had an epidural injection but did not respond to it.  The chiropractor did not help.  He went to [facility] where they repeated a NCV-EMG on 09/29/2021 which was negative.    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    He started feeling that his right foot will just get stuck.  He began experiencing tremors in hands.  DaTScan  on 09/23/2022 demonstrated moderate decreased relative radiotracer activity posterior LEFT putamen.  Equivocal exam could indicate early Parkinsonian syndrome pathology.  He was started on Sinemet  IR 25-100mg  three times daily.  He was seen in the Movement Disorder Clinic at [facility] where Parkinson's disease was suspected.  Sinemet  was increased to 1.5 tablet four times daily dosing and referred for PT to help with gait.  He has not noticed any improvement so he decreased Sinemet  back to 1 tablet three times daily.  Uses a crutch to help ambulate.  While he is extremely unsteady he denies falling backwards.  Past Medical History: Past Medical History:  Diagnosis Date   Arthritis    Bipolar disorder (HCC)    controlled by meds since 1997   Cancer (HCC)    basal cell and pre melanoma on back   Neuromuscular disorder (HCC)    gait and ambulation problems, but no diagnosis from Neurology   Pneumonia    Sleep apnea    cpap   Wears glasses    reading    Medications: Outpatient Encounter Medications as of 06/26/2024  Medication Sig   aspirin  EC 81 MG tablet Take 81 mg by mouth daily. Swallow whole.   bisoprolol -hydrochlorothiazide  (ZIAC ) 10-6.25 MG tablet Take 1 tablet by mouth daily.   bisoprolol -hydrochlorothiazide  (ZIAC ) 5-6.25 MG tablet TAKE 1 TABLET BY MOUTH EVERY DAY FOR BLOOD PRESSURE   carbidopa -levodopa  (SINEMET  IR) 25-100 MG tablet TAKE 1 TABLET BY MOUTH THREE TIMES A DAY   Cholecalciferol (VITAMIN D -3) 125 MCG (5000 UT) TABS Take 5,000 Units by mouth every evening.    Cyanocobalamin  (VITAMIN B-12) 1000 MCG SUBL Take 1 tablet SL Daily   divalproex  (DEPAKOTE ) 250 MG DR tablet Take 2  tablets (500 mg total) by mouth 2 (two) times daily.   meclizine  (ANTIVERT ) 25 MG tablet Take 1/2 to 1 tablet 2 to 3 x /day as needed for Dizziness / Vertigo (Patient taking differently: Take 25 mg by mouth as needed. Take 1/2 to 1 tablet 2 to 3 x /day as needed for Dizziness / Vertigo)   No facility-administered encounter medications on file as of 06/26/2024.    Allergies: Allergies[1]  Family History: Family History  Adopted: Yes    Observations/Objective:   No acute distress.  Alert and oriented.  Speech fluent and not dysarthric.  Language intact.  Eyes orthophoric on primary gaze.  Face symmetric.   Follow up Instructions:      -I discussed the assessment and treatment plan with the patient. The patient was provided an opportunity to ask questions and all were answered. The patient agreed with the plan and demonstrated an understanding of the instructions.   The patient was advised to call back or seek an in-person evaluation if the symptoms worsen or if the condition fails to improve  as anticipated.   Juliene Lamar Dunnings, DO     [1] No Active Allergies  "

## 2024-06-26 ENCOUNTER — Ambulatory Visit: Admitting: Neurology

## 2024-06-26 ENCOUNTER — Encounter: Payer: Self-pay | Admitting: Neurology

## 2024-06-26 VITALS — BP 120/60

## 2024-06-26 DIAGNOSIS — G20C Parkinsonism, unspecified: Secondary | ICD-10-CM

## 2024-06-26 MED ORDER — CARBIDOPA-LEVODOPA 25-100 MG PO TABS: 1.5000 | ORAL_TABLET | Freq: Four times a day (QID) | ORAL | 0 refills | Status: AC

## 2024-06-26 NOTE — Patient Instructions (Addendum)
 Increase carbidopa -levodopa  to 1.5 tablets four times daily (take each dose at same time everyday).  If no improvement in 4 weeks, contact me and we can increase dose. Will have you return for the [employer] Test punch skin biopsy Follow up 5 months.

## 2024-07-04 NOTE — Patient Instructions (Signed)
 INFORMATION FOR PATIENTS AFTER SKIN BIOPSY:   What You Need to Do:  1. Keep your current (large) bandage on for 24 hours and do not shower during this time. Leave today's bandage on until AFTER your next shower tomorrow.  Change your bandages every day starting tomorrow after that shower. Keep the bandages on while you shower. Change them once a day until a scab forms.  2. You can let the small steristrips fall off naturally and do not need to ever take them off.  They will eventually fall off on their own (after showering)  3. You may take showers after 24 hours, but DO NOT take tub baths, go in hot tubs or go swimming for seven days after the procedure.  4. You may use vasoline, bacitracin, or Polysporin ointment on the wounds as needed.  5. If a scab forms at the biopsy site, leave it alone.  6. If bleeding occurs, apply firm pressure for two minutes with a clean piece of gauze.   Please contact us  immediately if there is any redness, any signs of infection, or significant bleeding at the biopsy site.   Please call our office at [phone].

## 2024-07-05 ENCOUNTER — Ambulatory Visit: Admitting: Neurology

## 2024-07-05 DIAGNOSIS — G20C Parkinsonism, unspecified: Secondary | ICD-10-CM

## 2024-07-05 NOTE — Procedures (Signed)
 Punch Biopsy Procedure Note  Preprocedure Diagnosis: Bradykinesia; Tremor;   Postprocedure Diagnosis: same  Locations: Site 1: left, posterior cervical;  Site 2: above, left knee;  Site 3: above, left foot  Indications: r/o alpha synucleinopathy  Anesthesia: 3 mL Lidocaine  1% with epinephrine  without added sodium bicarbonate  Procedure Details Patient informed of the risks (including but not limited to bleeding, pain, infection, scar and infection) and benefits of the procedure.  Informed consent obtained.  The areas which were chosen for biopsy, as above, and surrounding areas were given a sterile prep using betadyne and draped in the usual sterile fashion. The skin was then stretched perpendicular to the skin tension lines and sample removed using the 3 mm punch. Pressure applied, hemostasis achieved.   Dressing applied. The specimen(s) was sent for pathologic examination. The patient tolerated the procedure well.  Estimated Blood Loss: 0 ml  Condition: Stable  Complications: none.  Plan: 1. Instructed to keep the wound dry and covered for 24-48h and clean thereafter. 2. Warning signs of infection were reviewed.

## 2024-10-23 ENCOUNTER — Ambulatory Visit: Admitting: Psychiatry

## 2024-11-07 ENCOUNTER — Telehealth: Admitting: Adult Health

## 2024-12-31 ENCOUNTER — Ambulatory Visit: Payer: Self-pay | Admitting: Neurology
